# Patient Record
Sex: Female | Born: 1985 | Race: Black or African American | Hispanic: No | Marital: Single | Smoking: Former smoker
Health system: Southern US, Community
[De-identification: ages and names within clinical notes are randomized; demographics above are authoritative.]

## PROBLEM LIST (undated history)

## (undated) DIAGNOSIS — A599 Trichomoniasis, unspecified: Secondary | ICD-10-CM

## (undated) DIAGNOSIS — Z789 Other specified health status: Secondary | ICD-10-CM

## (undated) DIAGNOSIS — G43909 Migraine, unspecified, not intractable, without status migrainosus: Secondary | ICD-10-CM

## (undated) DIAGNOSIS — D649 Anemia, unspecified: Secondary | ICD-10-CM

## (undated) DIAGNOSIS — N201 Calculus of ureter: Secondary | ICD-10-CM

## (undated) DIAGNOSIS — Z8619 Personal history of other infectious and parasitic diseases: Secondary | ICD-10-CM

## (undated) DIAGNOSIS — M1990 Unspecified osteoarthritis, unspecified site: Secondary | ICD-10-CM

## (undated) DIAGNOSIS — R519 Headache, unspecified: Secondary | ICD-10-CM

## (undated) DIAGNOSIS — R51 Headache: Secondary | ICD-10-CM

## (undated) DIAGNOSIS — R351 Nocturia: Secondary | ICD-10-CM

## (undated) HISTORY — PX: DILATION AND CURETTAGE OF UTERUS: SHX78

---
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
## 2005-08-18 ENCOUNTER — Emergency Department (HOSPITAL_COMMUNITY): Admission: EM | Admit: 2005-08-18 | Discharge: 2005-08-18 | Payer: Self-pay | Admitting: Family Medicine

## 2006-02-22 ENCOUNTER — Inpatient Hospital Stay (HOSPITAL_COMMUNITY): Admission: AD | Admit: 2006-02-22 | Discharge: 2006-02-24 | Payer: Self-pay | Admitting: Family Medicine

## 2006-02-22 ENCOUNTER — Ambulatory Visit: Payer: Self-pay | Admitting: Obstetrics and Gynecology

## 2006-07-06 ENCOUNTER — Emergency Department (HOSPITAL_COMMUNITY): Admission: EM | Admit: 2006-07-06 | Discharge: 2006-07-06 | Payer: Self-pay | Admitting: Emergency Medicine

## 2007-07-07 ENCOUNTER — Inpatient Hospital Stay (HOSPITAL_COMMUNITY): Admission: AD | Admit: 2007-07-07 | Discharge: 2007-07-07 | Payer: Self-pay | Admitting: Obstetrics & Gynecology

## 2008-03-05 ENCOUNTER — Inpatient Hospital Stay (HOSPITAL_COMMUNITY): Admission: AD | Admit: 2008-03-05 | Discharge: 2008-03-07 | Payer: Self-pay | Admitting: Obstetrics

## 2009-11-03 ENCOUNTER — Emergency Department (HOSPITAL_COMMUNITY): Admission: EM | Admit: 2009-11-03 | Discharge: 2009-11-03 | Payer: Self-pay | Admitting: Emergency Medicine

## 2010-02-27 ENCOUNTER — Emergency Department (HOSPITAL_COMMUNITY): Admission: EM | Admit: 2010-02-27 | Discharge: 2010-02-27 | Payer: Self-pay | Admitting: Emergency Medicine

## 2010-09-15 ENCOUNTER — Emergency Department (HOSPITAL_COMMUNITY)
Admission: EM | Admit: 2010-09-15 | Discharge: 2010-09-15 | Disposition: A | Discharge status: Home / Self Care | Payer: Medicaid Other | Attending: Emergency Medicine | Admitting: Emergency Medicine

## 2010-09-15 DIAGNOSIS — R1013 Epigastric pain: Secondary | ICD-10-CM | POA: Insufficient documentation

## 2010-09-15 DIAGNOSIS — R110 Nausea: Secondary | ICD-10-CM | POA: Insufficient documentation

## 2010-09-15 DIAGNOSIS — R5383 Other fatigue: Secondary | ICD-10-CM | POA: Insufficient documentation

## 2010-09-15 DIAGNOSIS — R5381 Other malaise: Secondary | ICD-10-CM | POA: Insufficient documentation

## 2010-09-15 DIAGNOSIS — R197 Diarrhea, unspecified: Secondary | ICD-10-CM | POA: Insufficient documentation

## 2010-09-15 LAB — URINE MICROSCOPIC-ADD ON

## 2010-09-15 LAB — POCT PREGNANCY, URINE: Preg Test, Ur: NEGATIVE

## 2010-09-15 LAB — URINALYSIS, ROUTINE W REFLEX MICROSCOPIC
Bilirubin Urine: NEGATIVE
Glucose, UA: NEGATIVE mg/dL
Hgb urine dipstick: NEGATIVE
Ketones, ur: NEGATIVE mg/dL
Nitrite: NEGATIVE
Protein, ur: NEGATIVE mg/dL
Specific Gravity, Urine: 1.023 (ref 1.005–1.030)
Urobilinogen, UA: 0.2 mg/dL (ref 0.0–1.0)
pH: 6 (ref 5.0–8.0)

## 2010-09-26 ENCOUNTER — Inpatient Hospital Stay (HOSPITAL_COMMUNITY)
Admission: AD | Admit: 2010-09-26 | Discharge: 2010-09-26 | Disposition: A | Discharge status: Home / Self Care | Payer: Medicaid Other | Source: Ambulatory Visit | Attending: Obstetrics & Gynecology | Admitting: Obstetrics & Gynecology

## 2010-09-26 DIAGNOSIS — N949 Unspecified condition associated with female genital organs and menstrual cycle: Secondary | ICD-10-CM | POA: Insufficient documentation

## 2010-09-26 DIAGNOSIS — N938 Other specified abnormal uterine and vaginal bleeding: Secondary | ICD-10-CM | POA: Insufficient documentation

## 2010-09-26 LAB — WET PREP, GENITAL
Trich, Wet Prep: NONE SEEN
Yeast Wet Prep HPF POC: NONE SEEN

## 2010-09-26 LAB — CBC
MCV: 82.4 fL (ref 78.0–100.0)
Platelets: 357 10*3/uL (ref 150–400)
RBC: 4.31 MIL/uL (ref 3.87–5.11)
RDW: 14.5 % (ref 11.5–15.5)
WBC: 7.1 10*3/uL (ref 4.0–10.5)

## 2010-09-26 LAB — POCT PREGNANCY, URINE: Preg Test, Ur: NEGATIVE

## 2010-10-24 NOTE — H&P (Signed)
NAMEMarland Kitchen  Ashley Spencer, Ashley Spencer NO.:  [id number]   MEDICAL RECORD NO.:  [id number]          PATIENT TYPE:  INP   LOCATION:  9165                          FACILITY:  [facility]   PHYSICIAN:  Roseanna Rainbow, M.D.DATE OF BIRTH:  05/19/1986   DATE OF ADMISSION:  03/05/2008  DATE OF DISCHARGE:                              HISTORY & PHYSICAL   CHIEF COMPLAINT:  The patient is a 25 year old para 2 with an estimated  date of confinement of March 01, 2008, with an intrauterine  pregnancy of 40 plus weeks complaining of contractions.   HISTORY OF PRESENT ILLNESS:  Please see the above.  She denies rupture  of membranes.   SOCIAL HISTORY:  She is single.  She currently smokes one-half pack per  day.  She denies any alcohol use.   PAST GYN HISTORY:  Normal triad.  There is a history of gonorrhea.   PAST OBSTETRICAL HISTORY:  In 2002 she was delivered of a live born female  7 pounds and 2 ounces, full term, spontaneous vaginal delivery, no  complications.  In 2007, she was delivered of a live born female 6 pounds,  full term, spontaneous vaginal delivery, no complications.   PAST MEDICAL HISTORY:  She denies.   PAST SURGICAL HISTORY:  She denies.   FAMILY HISTORY:  She denies.   MEDICATIONS:  Please see the reconciliation form.   PRENATAL LABS:  Blood type O+.  Antibody screen negative.  HIV  nonreactive.  Hemoglobin 11.9, hematocrit 36.4, platelets 270,000.  Ultrasound on Oct 16, 2007, at 20 weeks 3 days, normal fluid was in the  anterior normal level 2 anatomy.   PHYSICAL EXAM:  Vital signs, stable, afebrile.  Fetal heart tracing  reassuring.  Rare decelerations.  Sterile vaginal exam, cervix is 4-5 cm  dilated per the RN 80% effaced.   ASSESSMENT:  Multipara at term, late latent versus early active labor.  Fetal heart tracing consistent with fetal well being.   PLAN:  Admission, expectant management.      Roseanna Rainbow, M.D.  Electronically Signed     LAJ/MEDQ  D:  03/06/2008  T:  03/06/2008  Job:  [id number]   cc:   Kathreen Cosier, M.D.  Fax: [phone]

## 2010-10-27 NOTE — Op Note (Signed)
[facility]  Patient:    Ashley Spencer, Ashley Spencer Visit Number: [id number] MRN: [id number]          Service Type: OBS Location: 910A 9138 01 Attending Physician:  Venita Sheffield Dictated by:   Kathreen Cosier, M.D. Proc. Date: 01/29/01 Adm. Date:  01/29/2001                             Operative Report  DELIVERY NOTE  HISTORY OF PRESENT ILLNESS:   The patient is a 25 year old primigravida, [facility] January 25, 2001. She was admitted in labor at 1:10 p.m. Cervix was 4 cm, vertex, -2 and AROM performed at 2:50 p.m., the fluid was meconium stained. IUPC and scalp lead applied. She had some variables and amnioinfusion was done. The patient became fully dilated at 6:43 p.m. and she had deep variables during the first 10 minutes of pushing. These had a late component and they were down to 60 to 70, lasting sometimes in excess of a minute. The vertex was a +3 station and it was decided that she should be delivered with the vacuum.  DESCRIPTION OF PROCEDURE:     Local anesthesia was applied to the perineum and a fourth degree cut. The vacuum was applied at 6:55 p.m. It was applied for a total of five contractions prior to delivery. No popoffs occurred for the Tender Touch vacuum. The pressure was relaxed in between contractions. She had a normal vaginal delivery at 10 p.m. and she was DeLeed prior to the delivery of the shoulders. The fluid was clear from the nasopharynx and stomach. The team was in attendance. There was a nuchal cord x 1. The Apgar scores were 2, 6, and 8. The placenta was delivered spontaneously and sent to pathology. There was a fourth degree which was repaired in layers with 2-0 Vicryl. The anus and rectum were checked post-repair and noted to be intact. The patient was delivered from the [facility] position. Dictated by:   Kathreen Cosier, M.D. Attending Physician:  Venita Sheffield DD:  01/29/01 TD:  01/30/01 Job:  [id number]

## 2010-11-15 ENCOUNTER — Emergency Department (INDEPENDENT_AMBULATORY_CARE_PROVIDER_SITE_OTHER): Payer: Medicaid Other

## 2010-11-15 ENCOUNTER — Emergency Department (HOSPITAL_COMMUNITY)
Admission: EM | Admit: 2010-11-15 | Discharge: 2010-11-15 | Disposition: A | Discharge status: Home / Self Care | Payer: Self-pay | Attending: Emergency Medicine | Admitting: Emergency Medicine

## 2010-11-15 ENCOUNTER — Inpatient Hospital Stay (INDEPENDENT_AMBULATORY_CARE_PROVIDER_SITE_OTHER): Admit: 2010-11-15 | Discharge: 2010-11-15 | Disposition: A | Discharge status: Home / Self Care | Payer: Self-pay

## 2010-11-15 DIAGNOSIS — M129 Arthropathy, unspecified: Secondary | ICD-10-CM

## 2010-12-21 ENCOUNTER — Inpatient Hospital Stay (INDEPENDENT_AMBULATORY_CARE_PROVIDER_SITE_OTHER)
Admission: RE | Admit: 2010-12-21 | Discharge: 2010-12-21 | Disposition: A | Discharge status: Home / Self Care | Payer: Self-pay | Source: Ambulatory Visit | Attending: Family Medicine | Admitting: Family Medicine

## 2010-12-21 DIAGNOSIS — B353 Tinea pedis: Secondary | ICD-10-CM

## 2010-12-21 DIAGNOSIS — N912 Amenorrhea, unspecified: Secondary | ICD-10-CM

## 2010-12-21 DIAGNOSIS — M129 Arthropathy, unspecified: Secondary | ICD-10-CM

## 2010-12-21 LAB — POCT PREGNANCY, URINE: Preg Test, Ur: NEGATIVE

## 2011-02-02 ENCOUNTER — Encounter (HOSPITAL_COMMUNITY): Payer: Self-pay

## 2011-02-02 ENCOUNTER — Inpatient Hospital Stay (HOSPITAL_COMMUNITY)
Admission: AD | Admit: 2011-02-02 | Discharge: 2011-02-02 | Disposition: A | Discharge status: Home / Self Care | Payer: Medicaid Other | Source: Ambulatory Visit | Attending: Obstetrics & Gynecology | Admitting: Obstetrics & Gynecology

## 2011-02-02 DIAGNOSIS — N898 Other specified noninflammatory disorders of vagina: Secondary | ICD-10-CM | POA: Insufficient documentation

## 2011-02-02 HISTORY — DX: Other specified health status: Z78.9

## 2011-02-02 LAB — URINE MICROSCOPIC-ADD ON

## 2011-02-02 LAB — URINALYSIS, ROUTINE W REFLEX MICROSCOPIC
Bilirubin Urine: NEGATIVE
Glucose, UA: NEGATIVE mg/dL
Ketones, ur: NEGATIVE mg/dL
Specific Gravity, Urine: 1.01 (ref 1.005–1.030)
pH: 7 (ref 5.0–8.0)

## 2011-02-02 MED ORDER — MULTIVITAMINS PO CAPS: 1.0000 | ORAL_CAPSULE | Freq: Every day | ORAL | Status: DC

## 2011-02-02 MED ORDER — MEDROXYPROGESTERONE ACETATE 10 MG PO TABS: 10.0000 mg | ORAL_TABLET | Freq: Every day | ORAL | Status: DC

## 2011-02-02 NOTE — ED Provider Notes (Signed)
History     Chief Complaint  Patient presents with  . Vaginal Bleeding   Patient is a 25 y.o. female presenting with vaginal bleeding. The history is provided by the patient.  Vaginal Bleeding Pertinent negatives include no chills, fever, nausea or vomiting.    Ashley Spencer, 25 y.o. presents with heavy vaginal bleeding X 5 days with light cramping.  Changing pad every 10-15 minutes.  LMP 820/12.  Had Depo March did not get injection as planned in July.  Is asking for medication to stop the bleeding.  Sexually active X 1 partner.  Was using Depo for birth control.  Treated in April for chlamydia.  Denies UTI and vaginal sxs.  Pregnancy test here is negative.  Past Medical History  Diagnosis Date  . No pertinent past medical history     Past Surgical History  Procedure Date  . Dilation and curettage of uterus     No family history on file.  History  Substance Use Topics  . Smoking status: Current Everyday Smoker -- 0.2 packs/day  . Smokeless tobacco: Never Used  . Alcohol Use: No    Allergies: No Known Allergies  No prescriptions prior to admission    Review of Systems  Constitutional: Negative for fever and chills.  Gastrointestinal: Negative for nausea and vomiting.  Genitourinary: Positive for vaginal bleeding. Negative for dysuria, urgency, frequency and hematuria. Flank pain: vaginal bleeding.   Physical Exam   Blood pressure 121/74, pulse 67, temperature 99 F (37.2 C), temperature source Oral, resp. rate 16, height 5' (1.524 m), weight 108 lb 6 oz (49.159 kg), last menstrual period 01/29/2011.  Physical Exam  MAU Course  Procedures   GC/CHl cultures to lab.    Wet prep top came off during transport to lab--will cancel order because patient had to leave to get her children and cannot repeat test.  MD Patient reported to me at 4:30 that her ride was leaving and she had to get her children from day care at 5 or she is fined.   She could not stay for CBC  draw.   Explained results for cult would be back on Monday.  I would call her medication into [facility] [address] and call her if I needed.  She agreed.  Her # is [phone].     Consulted with Dr. Marice Potter.Reported patient's complaint and that she needed to leave.  Agreed with Provera 10mg  po qd X 10 days.  Will call in Multivitamins.  Encourage to go to [facility] for Depo Provera.   Assessment and Plan  Vaginal bleeding  KEY,EVE M 02/02/2011, 4:48 PM   Matt Holmes, NP 02/06/11 1913

## 2011-02-02 NOTE — Progress Notes (Signed)
Pt states last depo injection in April, supposed to return 12/12/2010 for second dose, missed appt, now having bleeding qweek, will stop for 2-3 days, then return. Denies vag d/c changes.

## 2011-03-01 LAB — URINALYSIS, ROUTINE W REFLEX MICROSCOPIC
Glucose, UA: NEGATIVE
Ketones, ur: NEGATIVE
Protein, ur: NEGATIVE
Urobilinogen, UA: 0.2

## 2011-03-01 LAB — POCT PREGNANCY, URINE
Operator id: 220991
Preg Test, Ur: POSITIVE

## 2011-03-12 LAB — CBC
HCT: 33.1 — ABNORMAL LOW
HCT: 36.4
Hemoglobin: 10.9 — ABNORMAL LOW
MCV: 85.7
Platelets: 270
RDW: 14.7
WBC: 20.7 — ABNORMAL HIGH
WBC: 9.2

## 2011-03-12 LAB — DIFFERENTIAL
Basophils Absolute: 0
Eosinophils Relative: 1
Lymphs Abs: 2
Monocytes Absolute: 0.8
Monocytes Relative: 9
Neutrophils Relative %: 68

## 2011-03-12 LAB — HEPATITIS B SURFACE ANTIGEN: Hepatitis B Surface Ag: NEGATIVE

## 2011-03-12 LAB — RUBELLA SCREEN: Rubella: 18 — ABNORMAL HIGH

## 2011-03-12 LAB — RAPID HIV SCREEN (WH-MAU): Rapid HIV Screen: NONREACTIVE

## 2011-03-12 LAB — TYPE AND SCREEN

## 2011-05-13 ENCOUNTER — Emergency Department (HOSPITAL_COMMUNITY)
Admission: EM | Admit: 2011-05-13 | Discharge: 2011-05-13 | Discharge status: Against Medical Advice | Payer: Medicaid Other | Attending: Emergency Medicine | Admitting: Emergency Medicine

## 2011-05-13 DIAGNOSIS — R070 Pain in throat: Secondary | ICD-10-CM | POA: Insufficient documentation

## 2011-05-13 DIAGNOSIS — R109 Unspecified abdominal pain: Secondary | ICD-10-CM | POA: Insufficient documentation

## 2011-05-14 MED ORDER — ACETAMINOPHEN 325 MG PO TABS
ORAL_TABLET | ORAL | Status: AC
  Filled 2011-05-14: qty 2

## 2011-06-15 ENCOUNTER — Inpatient Hospital Stay (HOSPITAL_COMMUNITY)
Admission: AD | Admit: 2011-06-15 | Discharge: 2011-06-16 | Disposition: A | Discharge status: Home / Self Care | Payer: Medicaid Other | Source: Ambulatory Visit | Attending: Obstetrics | Admitting: Obstetrics

## 2011-06-15 DIAGNOSIS — R1032 Left lower quadrant pain: Secondary | ICD-10-CM | POA: Insufficient documentation

## 2011-06-15 DIAGNOSIS — A5901 Trichomonal vulvovaginitis: Secondary | ICD-10-CM

## 2011-06-15 LAB — URINALYSIS, ROUTINE W REFLEX MICROSCOPIC
Bilirubin Urine: NEGATIVE
Ketones, ur: 15 mg/dL — AB
Leukocytes, UA: NEGATIVE
Nitrite: NEGATIVE
Protein, ur: NEGATIVE mg/dL
Urobilinogen, UA: 0.2 mg/dL (ref 0.0–1.0)
pH: 6 (ref 5.0–8.0)

## 2011-06-15 NOTE — Progress Notes (Signed)
Pt states, " I've had pain in my low left abdomen since this morning."

## 2011-06-16 ENCOUNTER — Encounter (HOSPITAL_COMMUNITY): Payer: Self-pay | Admitting: Obstetrics and Gynecology

## 2011-06-16 LAB — WET PREP, GENITAL

## 2011-06-16 MED ORDER — METRONIDAZOLE 500 MG PO TABS: 500.0000 mg | ORAL_TABLET | Freq: Two times a day (BID) | ORAL | Status: AC

## 2011-06-16 MED ORDER — IBUPROFEN 400 MG PO TABS
400.0000 mg | ORAL_TABLET | Freq: Once | ORAL | Status: AC
  Administered 2011-06-16: 400 mg via ORAL
  Filled 2011-06-16: qty 1

## 2011-06-16 NOTE — Progress Notes (Signed)
"  My stomach started hurting this morning and it has gone on all day.  It is sharp.  No bleeding, abnormal d/c, or N/V."

## 2011-06-16 NOTE — ED Provider Notes (Signed)
History     Chief Complaint  Patient presents with  . Abdominal Pain   HPI Ashley Spencer 26 y.o. LMP 05-27-11.  Comes with LLQ pain.  No nausea.  No vomiting.  No vaginal discharge.   OB History    Grav Para Term Preterm Abortions TAB SAB Ect Mult Living   5 3 3  2 2    3       Past Medical History  Diagnosis Date  . No pertinent past medical history     Past Surgical History  Procedure Date  . Dilation and curettage of uterus     History reviewed. No pertinent family history.  History  Substance Use Topics  . Smoking status: Current Everyday Smoker -- 0.7 packs/day for 3 years    Types: Cigarettes  . Smokeless tobacco: Never Used  . Alcohol Use: No    Allergies: No Known Allergies  Prescriptions prior to admission  Medication Sig Dispense Refill  . Multiple Vitamin (MULITIVITAMIN WITH MINERALS) TABS Take 1 tablet by mouth daily.        . naproxen sodium (ANAPROX) 220 MG tablet Take 220 mg by mouth 2 (two) times daily as needed. For pain         ROS Physical Exam   Blood pressure 115/71, pulse 86, temperature 99 F (37.2 C), temperature source Oral, resp. rate 20, height 5' 0.75" (1.543 m), weight 105 lb 4 oz (47.741 kg), last menstrual period 05/27/2011.  Physical Exam  Nursing note and vitals reviewed. Constitutional: She is oriented to person, place, and time. She appears well-developed and well-nourished.  HENT:  Head: Normocephalic.  Eyes: EOM are normal.  Neck: Neck supple.  GI: Soft. There is tenderness. There is no rebound and no guarding.       Mild LLQ tenderness  Genitourinary:       Speculum exam: Vagina - Small amount of dry, grainy discharge, no odor Cervix - No contact bleeding Bimanual exam: Cervix closed Uterus mildly tender, normal size Adnexa mildly tender on left side, no masses bilaterally GC/Chlam, wet prep done Chaperone present for exam.  Musculoskeletal: Normal range of motion.  Neurological: She is alert and oriented  to person, place, and time.  Skin: Skin is warm and dry.  Psychiatric: She has a normal mood and affect.    MAU Course  Procedures  MDM Results for orders placed during the hospital encounter of 06/15/11 (from the past 24 hour(s))  URINALYSIS, ROUTINE W REFLEX MICROSCOPIC     Status: Abnormal   Collection Time   06/15/11 10:55 PM      Component Value Range   Color, Urine YELLOW  YELLOW    APPearance CLEAR  CLEAR    Specific Gravity, Urine 1.025  1.005 - 1.030    pH 6.0  5.0 - 8.0    Glucose, UA NEGATIVE  NEGATIVE (mg/dL)   Hgb urine dipstick NEGATIVE  NEGATIVE    Bilirubin Urine NEGATIVE  NEGATIVE    Ketones, ur 15 (*) NEGATIVE (mg/dL)   Protein, ur NEGATIVE  NEGATIVE (mg/dL)   Urobilinogen, UA 0.2  0.0 - 1.0 (mg/dL)   Nitrite NEGATIVE  NEGATIVE    Leukocytes, UA NEGATIVE  NEGATIVE   POCT PREGNANCY, URINE     Status: Normal   Collection Time   06/15/11 11:04 PM      Component Value Range   Preg Test, Ur NEGATIVE    WET PREP, GENITAL     Status: Abnormal   Collection Time  06/16/11  1:20 AM      Component Value Range   Yeast, Wet Prep NONE SEEN  NONE SEEN    Trich, Wet Prep FEW (*) NONE SEEN    Clue Cells, Wet Prep MANY (*) NONE SEEN    WBC, Wet Prep HPF POC MODERATE (*) NONE SEEN      Assessment and Plan  Trichomonas  Plan Rx metronidazole 500 mg PO bid x 7 days  Refer partner for treatment - can receive treatment at the STD clinic at the [facility] dept No sex for 10 days No alcohol while taking your medication. Condoms always for STD/HIV protection.  Ashley Spencer 06/16/2011, 1:20 AM   Nolene Bernheim, NP 06/16/11 0155

## 2012-05-03 ENCOUNTER — Emergency Department (INDEPENDENT_AMBULATORY_CARE_PROVIDER_SITE_OTHER)
Admission: EM | Admit: 2012-05-03 | Discharge: 2012-05-03 | Disposition: A | Discharge status: Home / Self Care | Payer: Self-pay | Source: Home / Self Care

## 2012-05-03 ENCOUNTER — Encounter (HOSPITAL_COMMUNITY): Payer: Self-pay | Admitting: Emergency Medicine

## 2012-05-03 DIAGNOSIS — K122 Cellulitis and abscess of mouth: Secondary | ICD-10-CM

## 2012-05-03 DIAGNOSIS — K0262 Dental caries on smooth surface penetrating into dentin: Secondary | ICD-10-CM

## 2012-05-03 DIAGNOSIS — K089 Disorder of teeth and supporting structures, unspecified: Secondary | ICD-10-CM

## 2012-05-03 DIAGNOSIS — B89 Unspecified parasitic disease: Secondary | ICD-10-CM

## 2012-05-03 DIAGNOSIS — K1370 Unspecified lesions of oral mucosa: Secondary | ICD-10-CM

## 2012-05-03 DIAGNOSIS — K0889 Other specified disorders of teeth and supporting structures: Secondary | ICD-10-CM

## 2012-05-03 DIAGNOSIS — L089 Local infection of the skin and subcutaneous tissue, unspecified: Secondary | ICD-10-CM

## 2012-05-03 MED ORDER — AMOXICILLIN-POT CLAVULANATE 875-125 MG PO TABS: 1.0000 | ORAL_TABLET | Freq: Two times a day (BID) | ORAL | Status: DC

## 2012-05-03 MED ORDER — KETOROLAC TROMETHAMINE 60 MG/2ML IM SOLN
INTRAMUSCULAR | Status: AC
  Filled 2012-05-03: qty 2

## 2012-05-03 MED ORDER — KETOROLAC TROMETHAMINE 60 MG/2ML IM SOLN
60.0000 mg | Freq: Once | INTRAMUSCULAR | Status: AC
  Administered 2012-05-03: 60 mg via INTRAMUSCULAR

## 2012-05-03 MED ORDER — HYDROCODONE-ACETAMINOPHEN 7.5-325 MG PO TABS: 1.0000 | ORAL_TABLET | Freq: Four times a day (QID) | ORAL | Status: DC | PRN

## 2012-05-03 NOTE — ED Notes (Signed)
Pt c/o dental pain (upper right) x2 days... Noticed swelling this am on right side of face... Denies: fevers, vomiting, nauseas, diarrhea... Pt is alert w/no signs of distress.

## 2012-05-03 NOTE — ED Provider Notes (Signed)
History     CSN: [id number]  Arrival date & time 05/03/12  1224   None     Chief Complaint  Patient presents with  . Dental Pain    (Consider location/radiation/quality/duration/timing/severity/associated sxs/prior treatment) HPI Comments: A female presents with a toothache for 2 days. This morning she started swelling or several days. She states on October 31 she bit down a piece of candy and a portion of her filling broke out. She did not have pain from that until just a few days ago.  Patient is a 26 y.o. female presenting with tooth pain.  Dental PainPrimary symptoms do not include fever.    Past Medical History  Diagnosis Date  . No pertinent past medical history     Past Surgical History  Procedure Date  . Dilation and curettage of uterus     No family history on file.  History  Substance Use Topics  . Smoking status: Current Every Day Smoker -- 1.0 packs/day for 3 years    Types: Cigarettes  . Smokeless tobacco: Never Used  . Alcohol Use: No    OB History    Grav Para Term Preterm Abortions TAB SAB Ect Mult Living   5 3 3  2 2    3       Review of Systems  Constitutional: Negative for fever.  All other systems reviewed and are negative.    Allergies  Review of patient's allergies indicates no known allergies.  Home Medications   Current Outpatient Rx  Name  Route  Sig  Dispense  Refill  . NAPROXEN SODIUM 220 MG PO TABS   Oral   Take 220 mg by mouth 2 (two) times daily as needed. For pain          . AMOXICILLIN-POT CLAVULANATE 875-125 MG PO TABS   Oral   Take 1 tablet by mouth every 12 (twelve) hours.   14 tablet   0   . HYDROCODONE-ACETAMINOPHEN 7.5-325 MG PO TABS   Oral   Take 1 tablet by mouth every 6 (six) hours as needed for pain.   20 tablet   0   . ADULT MULTIVITAMIN W/MINERALS CH   Oral   Take 1 tablet by mouth daily.             BP 135/94  Pulse 80  Temp 98.2 F (36.8 C) (Oral)  Resp 18  SpO2 100%  LMP  04/19/2012  Physical Exam  Constitutional: She is oriented to person, place, and time. She appears well-developed and well-nourished. No distress.  HENT:  Mouth/Throat: Oropharynx is clear and moist.       Swelling of the gingiva along the right upper second and third molars. There is dental tenderness of the right upper premolar where there is a cavernous tooth. There is also right facial swelling and pain that extends to the right paranasal area and to the chin. No erythema  Eyes: Conjunctivae normal and EOM are normal.  Neck: Normal range of motion. Neck supple.  Pulmonary/Chest: Effort normal.  Neurological: She is alert and oriented to person, place, and time. She exhibits normal muscle tone.  Skin: Skin is warm and dry.  Psychiatric: She has a normal mood and affect.    ED Course  Procedures (including critical care time)  Labs Reviewed - No data to display No results found.   1. Dental caries extending into dentine   2. Odontalgia   3. Infection, face   4. Infection of mouth  MDM  Augmentin 875 twice a day for 7 days Toradol 60 mg IM now Ibuprofen 600 mg Every 6 hours when necessary pain Norco 7.5 mg every 4 hours when necessary pain #20 Must find a dentist as soon as possible.        Hayden Rasmussen, NP 05/03/12 1406

## 2012-05-03 NOTE — ED Provider Notes (Signed)
Medical screening examination/treatment/procedure(s) were performed by resident physician or non-physician practitioner and as supervising physician I was immediately available for consultation/collaboration.   Milas Schappell DOUGLAS MD.    Ashtynn Berke D Evalee Gerard, MD 05/03/12 1832 

## 2012-11-03 ENCOUNTER — Inpatient Hospital Stay (HOSPITAL_COMMUNITY)
Admission: AD | Admit: 2012-11-03 | Discharge: 2012-11-03 | Disposition: A | Discharge status: Home / Self Care | Payer: Medicaid Other | Source: Ambulatory Visit | Attending: Obstetrics | Admitting: Obstetrics

## 2012-11-03 ENCOUNTER — Encounter (HOSPITAL_COMMUNITY): Payer: Self-pay | Admitting: *Deleted

## 2012-11-03 DIAGNOSIS — B9689 Other specified bacterial agents as the cause of diseases classified elsewhere: Secondary | ICD-10-CM

## 2012-11-03 DIAGNOSIS — N760 Acute vaginitis: Secondary | ICD-10-CM | POA: Insufficient documentation

## 2012-11-03 DIAGNOSIS — N949 Unspecified condition associated with female genital organs and menstrual cycle: Secondary | ICD-10-CM | POA: Insufficient documentation

## 2012-11-03 DIAGNOSIS — A499 Bacterial infection, unspecified: Secondary | ICD-10-CM | POA: Insufficient documentation

## 2012-11-03 HISTORY — DX: Trichomoniasis, unspecified: A59.9

## 2012-11-03 LAB — WET PREP, GENITAL: Trich, Wet Prep: NONE SEEN

## 2012-11-03 LAB — URINALYSIS, ROUTINE W REFLEX MICROSCOPIC
Bilirubin Urine: NEGATIVE
Leukocytes, UA: NEGATIVE
Nitrite: NEGATIVE
Specific Gravity, Urine: 1.02 (ref 1.005–1.030)
Urobilinogen, UA: 0.2 mg/dL (ref 0.0–1.0)
pH: 6.5 (ref 5.0–8.0)

## 2012-11-03 MED ORDER — METRONIDAZOLE 500 MG PO TABS: 500.0000 mg | ORAL_TABLET | Freq: Two times a day (BID) | ORAL | Status: DC

## 2012-11-03 NOTE — MAU Note (Signed)
Patient states she has been having a slight white vaginal discharge with an odor for about one week. Denies bleeding or pain.

## 2012-11-03 NOTE — MAU Provider Note (Signed)
History     CSN: [id number]  Arrival date and time: 11/03/12 1636   First Provider Initiated Contact with Patient 11/03/12 1849      Chief Complaint  Patient presents with  . Vaginal Discharge   HPI  Ashley Spencer is a 27 y.o. [id number] who presents today with vaginal discharge with odor. She states the the vaginal discharge started about a week ago, and she has noticed the odor for the last 2 days. She denies any itching.   Past Medical History  Diagnosis Date  . No pertinent past medical history     Past Surgical History  Procedure Laterality Date  . Dilation and curettage of uterus      No family history on file.  History  Substance Use Topics  . Smoking status: Current Every Day Smoker -- 1.00 packs/day for 3 years    Types: Cigarettes  . Smokeless tobacco: Never Used  . Alcohol Use: No    Allergies: No Known Allergies  Prescriptions prior to admission  Medication Sig Dispense Refill  . amoxicillin-clavulanate (AUGMENTIN) 875-125 MG per tablet Take 1 tablet by mouth every 12 (twelve) hours.  14 tablet  0  . HYDROcodone-acetaminophen (NORCO) 7.5-325 MG per tablet Take 1 tablet by mouth every 6 (six) hours as needed for pain.  20 tablet  0  . Multiple Vitamin (MULITIVITAMIN WITH MINERALS) TABS Take 1 tablet by mouth daily.        . naproxen sodium (ANAPROX) 220 MG tablet Take 220 mg by mouth 2 (two) times daily as needed. For pain         ROS Physical Exam   Blood pressure 103/77, pulse 79, temperature 98.3 F (36.8 C), temperature source Oral, resp. rate 16, height 5' (1.524 m), weight 47.537 kg (104 lb 12.8 oz), last menstrual period 10/19/2012, SpO2 100.00%.  Physical Exam  Nursing note and vitals reviewed. Constitutional: She is oriented to person, place, and time. She appears well-developed and well-nourished. No distress.  Cardiovascular: Normal rate.   Respiratory: Effort normal.  GI: Soft.  Genitourinary:  External: no lesion Vagina: scant  amount of white discahrge Cervix: pink, parous, smooth, no CMT Uterus: AV, NSSC Adnexa: NT  Neurological: She is alert and oriented to person, place, and time.  Skin: Skin is warm and dry.  Psychiatric: She has a normal mood and affect.    MAU Course  Procedures  Results for orders placed during the hospital encounter of 11/03/12 (from the past 24 hour(s))  URINALYSIS, ROUTINE W REFLEX MICROSCOPIC     Status: None   Collection Time    11/03/12  5:30 PM      Result Value Range   Color, Urine YELLOW  YELLOW   APPearance CLEAR  CLEAR   Specific Gravity, Urine 1.020  1.005 - 1.030   pH 6.5  5.0 - 8.0   Glucose, UA NEGATIVE  NEGATIVE mg/dL   Hgb urine dipstick NEGATIVE  NEGATIVE   Bilirubin Urine NEGATIVE  NEGATIVE   Ketones, ur NEGATIVE  NEGATIVE mg/dL   Protein, ur NEGATIVE  NEGATIVE mg/dL   Urobilinogen, UA 0.2  0.0 - 1.0 mg/dL   Nitrite NEGATIVE  NEGATIVE   Leukocytes, UA NEGATIVE  NEGATIVE  POCT PREGNANCY, URINE     Status: None   Collection Time    11/03/12  5:39 PM      Result Value Range   Preg Test, Ur NEGATIVE  NEGATIVE  WET PREP, GENITAL     Status: Abnormal  Collection Time    11/03/12  6:55 PM      Result Value Range   Yeast Wet Prep HPF POC NONE SEEN  NONE SEEN   Trich, Wet Prep NONE SEEN  NONE SEEN   Clue Cells Wet Prep HPF POC FEW (*) NONE SEEN   WBC, Wet Prep HPF POC FEW (*) NONE SEEN     Assessment and Plan   1. BV (bacterial vaginosis)    RX: Flagyl 500mg  BID X 7 FU with Dr. Gaynell Face as needed  Tawnya Crook 11/03/2012, 6:54 PM

## 2012-11-04 LAB — GC/CHLAMYDIA PROBE AMP: GC Probe RNA: NEGATIVE

## 2013-05-27 ENCOUNTER — Emergency Department (HOSPITAL_COMMUNITY)
Admission: EM | Admit: 2013-05-27 | Discharge: 2013-05-27 | Discharge status: Against Medical Advice | Payer: Medicaid Other | Attending: Emergency Medicine | Admitting: Emergency Medicine

## 2013-05-27 ENCOUNTER — Emergency Department (HOSPITAL_COMMUNITY): Admit: 2013-05-27 | Payer: Medicaid Other | Source: Home / Self Care

## 2013-05-27 ENCOUNTER — Encounter (HOSPITAL_COMMUNITY): Payer: Self-pay | Admitting: Emergency Medicine

## 2013-05-27 DIAGNOSIS — R110 Nausea: Secondary | ICD-10-CM | POA: Insufficient documentation

## 2013-05-27 DIAGNOSIS — Z2089 Contact with and (suspected) exposure to other communicable diseases: Secondary | ICD-10-CM | POA: Insufficient documentation

## 2013-05-27 DIAGNOSIS — J029 Acute pharyngitis, unspecified: Secondary | ICD-10-CM | POA: Insufficient documentation

## 2013-05-27 DIAGNOSIS — F17200 Nicotine dependence, unspecified, uncomplicated: Secondary | ICD-10-CM | POA: Insufficient documentation

## 2013-05-27 MED ORDER — ONDANSETRON 4 MG PO TBDP
8.0000 mg | ORAL_TABLET | Freq: Once | ORAL | Status: AC
  Administered 2013-05-27: 8 mg via ORAL
  Filled 2013-05-27: qty 2

## 2013-05-27 NOTE — ED Notes (Signed)
Pt c/o sore throat and nausea, nausea controlled with medicaiton, just sore throat concerned now

## 2013-05-27 NOTE — ED Notes (Signed)
Pt states that she has been nauseated today, but has not vomited and has a sore throat today

## 2013-05-30 ENCOUNTER — Emergency Department (HOSPITAL_COMMUNITY): Payer: Medicaid Other

## 2013-05-30 ENCOUNTER — Encounter (HOSPITAL_COMMUNITY): Payer: Self-pay | Admitting: Emergency Medicine

## 2013-05-30 ENCOUNTER — Emergency Department (HOSPITAL_COMMUNITY)
Admission: EM | Admit: 2013-05-30 | Discharge: 2013-05-30 | Disposition: A | Discharge status: Home / Self Care | Payer: Medicaid Other | Attending: Emergency Medicine | Admitting: Emergency Medicine

## 2013-05-30 DIAGNOSIS — L03011 Cellulitis of right finger: Secondary | ICD-10-CM

## 2013-05-30 DIAGNOSIS — W230XXA Caught, crushed, jammed, or pinched between moving objects, initial encounter: Secondary | ICD-10-CM | POA: Insufficient documentation

## 2013-05-30 DIAGNOSIS — Y939 Activity, unspecified: Secondary | ICD-10-CM | POA: Insufficient documentation

## 2013-05-30 DIAGNOSIS — S6991XA Unspecified injury of right wrist, hand and finger(s), initial encounter: Secondary | ICD-10-CM

## 2013-05-30 DIAGNOSIS — S6000XA Contusion of unspecified finger without damage to nail, initial encounter: Secondary | ICD-10-CM | POA: Insufficient documentation

## 2013-05-30 DIAGNOSIS — F17200 Nicotine dependence, unspecified, uncomplicated: Secondary | ICD-10-CM | POA: Insufficient documentation

## 2013-05-30 DIAGNOSIS — Z8619 Personal history of other infectious and parasitic diseases: Secondary | ICD-10-CM | POA: Insufficient documentation

## 2013-05-30 DIAGNOSIS — Y929 Unspecified place or not applicable: Secondary | ICD-10-CM | POA: Insufficient documentation

## 2013-05-30 DIAGNOSIS — IMO0002 Reserved for concepts with insufficient information to code with codable children: Secondary | ICD-10-CM | POA: Insufficient documentation

## 2013-05-30 MED ORDER — CEPHALEXIN 500 MG PO CAPS: 500.0000 mg | ORAL_CAPSULE | Freq: Four times a day (QID) | ORAL | Status: DC

## 2013-05-30 MED ORDER — HYDROCODONE-ACETAMINOPHEN 5-325 MG PO TABS: 2.0000 | ORAL_TABLET | ORAL | Status: DC | PRN

## 2013-05-30 MED ORDER — HYDROCODONE-ACETAMINOPHEN 5-325 MG PO TABS
1.0000 | ORAL_TABLET | Freq: Once | ORAL | Status: AC
  Administered 2013-05-30: 1 via ORAL
  Filled 2013-05-30: qty 1

## 2013-05-30 NOTE — ED Provider Notes (Signed)
CSN: [id number]     Arrival date & time 05/30/13  1220 History  This chart was scribed for non-physician practitioner, Lesleigh Noe, working with Suzi Roots, MD by Smiley Houseman, ED Scribe. This patient was seen in room TR07C/TR07C and the patient's care was started at 1:07pm.   Chief Complaint  Patient presents with  . Hand Injury   The history is provided by the patient. No language interpreter was used.   HPI Comments: Ashley Spencer is a 27 y.o. female with no PMH who presents to the Emergency Department complaining of constant moderate pain in her right middle and ring fingers onset 3 days ago after slamming her hand in car door.  Patient has had artifical nails since October with new filling two weeks ago.  She reports pain with palpation and hand movement, however, has no limitations.  Patient states the finger has been draining pus and blood from under her acrylic nails.  She denies significant drainage.  She states she has has constant numbness in the distal portion of her right middle finger.  Patient has no h/o injury or fractures to the right hand.  Patient denies any vomiting, abdominal pain, fever, appetite change and headache.  Patient has taken Aleve for pain, but without relief.  Patient denies any allergies or other medical problems.    Past Medical History  Diagnosis Date  . No pertinent past medical history   . Trichomonas    Past Surgical History  Procedure Laterality Date  . Dilation and curettage of uterus     History reviewed. No pertinent family history. History  Substance Use Topics  . Smoking status: Current Every Day Smoker -- 1.00 packs/day for 3 years    Types: Cigarettes  . Smokeless tobacco: Never Used  . Alcohol Use: No   OB History   Grav Para Term Preterm Abortions TAB SAB Ect Mult Living   5 3 3  2 2    3      Review of Systems  Constitutional: Negative for fever, chills, activity change, appetite change and fatigue.  Respiratory:  Negative for cough.   Gastrointestinal: Negative for nausea, vomiting and abdominal pain.  Musculoskeletal: Negative for back pain and myalgias.  Skin: Negative for color change and wound.  Neurological: Positive for numbness. Negative for weakness and headaches.  All other systems reviewed and are negative.    Allergies  Review of patient's allergies indicates no known allergies.  Home Medications   Current Outpatient Rx  Name  Route  Sig  Dispense  Refill  . acetaminophen (TYLENOL) 500 MG tablet   Oral   Take 1,000 mg by mouth every 6 (six) hours as needed for pain.         . naproxen sodium (ANAPROX) 220 MG tablet   Oral   Take 440 mg by mouth 2 (two) times daily as needed. For pain          Triage Vitals: BP 117/77  Pulse 76  Temp(Src) 98 F (36.7 C) (Oral)  Resp 18  Wt 106 lb 3.2 oz (48.172 kg)  SpO2 100%  LMP 05/16/2013  Filed Vitals:   05/30/13 1230  BP: 117/77  Pulse: 76  Temp: 98 F (36.7 C)  TempSrc: Oral  Resp: 18  Weight: 106 lb 3.2 oz (48.172 kg)  SpO2: 100%    Physical Exam  Nursing note and vitals reviewed. Constitutional: She is oriented to person, place, and time. She appears well-developed and well-nourished. No distress.  HENT:  Head: Normocephalic and atraumatic.  Right Ear: External ear normal.  Left Ear: External ear normal.  Nose: Nose normal.  Mouth/Throat: Oropharynx is clear and moist.  Eyes: Conjunctivae and EOM are normal. Right eye exhibits no discharge. Left eye exhibits no discharge.  Neck: Neck supple. No tracheal deviation present.  Cardiovascular: Normal rate, regular rhythm and normal heart sounds.  Exam reveals no gallop and no friction rub.   No murmur heard. Radial pulses present bilaterally.  Capillary refill <2 seconds on digits of right hand.    Pulmonary/Chest: Effort normal and breath sounds normal. No respiratory distress. She has no wheezes. She has no rales. She exhibits no tenderness.  Abdominal: Soft.   Musculoskeletal: Normal range of motion. She exhibits edema and tenderness.       Hands: Diffuse tenderness to palpation to the distal portion of the right 3rd and 4th phalanges.  No felon or obvious paronychia surrounding acrylic nails to the 3rd and 4th digits.  Minimal purulent and bloody drainage expelled from nailbed of 3rd and 4th digits.  Minimal edema to the distal phalanges of the 3rd and 4th digits which does not extend past the DIP.  No erythema.  Patient able to flex and extend PIP and DIP joints of right hand without difficulty or limitations.  No open wounds or lacerations.    Neurological: She is alert and oriented to person, place, and time.  Sensation intact in the digits of right hand.  Skin: Skin is warm and dry. She is not diaphoretic. No erythema.  Psychiatric: She has a normal mood and affect. Her behavior is normal.    ED Course  Procedures (including critical care time) DIAGNOSTIC STUDIES: Oxygen Saturation is 100% on room air, normal by my interpretation.    COORDINATION OF CARE: 1:10 PM-Waiting for x-ray results.  Will order Vicodin.  Patient informed of current plan of treatment and evaluation and agrees with plan.    Labs Review Labs Reviewed - No data to display Imaging Review Dg Hand Complete Right  05/30/2013   CLINICAL DATA:  Close right hand in car door.  EXAM: RIGHT HAND - COMPLETE 3+ VIEW  COMPARISON:  None.  FINDINGS: There is no evidence of fracture or dislocation. There is no evidence of arthropathy or other focal bone abnormality. Soft tissues are unremarkable.  IMPRESSION: Negative.   Electronically Signed   By: Charlett Nose M.D.   On: 05/30/2013 13:30    MDM   Ashley Spencer is a 27 y.o. female with no PMH who presents to the Emergency Department complaining of constant moderate pain in her right middle and ring fingers onset 3 days ago after slamming her hand in car door.     Patient evaluated in the ED for a finger injury occuring 3 days  prior to arrival.  X-rays negative for fx or malalignment.  No open lacerations.  Patient likely developing a nailbed infection under her acrylic nails, but this is unable to be fully visualized.   No felon or obvious paronychia at this time.  There was minimal purulent material expelled from under acrylic nails, which were unable to be removed.  Patient neurovascularly intact.  Patient started on Keflex and instructed to trim down nails or have them removed as an OP.  Patient instructed to return to the ED in two days for wound re-check.  Return precautions, discharge instructions, and follow-up was discussed with the patient before discharge.    Discharge Medication List as of  05/30/2013  2:03 PM    START taking these medications   Details  cephALEXin (KEFLEX) 500 MG capsule Take 1 capsule (500 mg total) by mouth 4 (four) times daily., Starting 05/30/2013, Until Discontinued, Print    HYDROcodone-acetaminophen (NORCO/VICODIN) 5-325 MG per tablet Take 2 tablets by mouth every 4 (four) hours as needed., Starting 05/30/2013, Until Discontinued, Print        Final impressions: 1. Finger injury, right, initial encounter   2. Infected nailbed of finger, right      Luiz Iron PA-C   This patient was discussed with Dr. Denton Lank who evaluated the patient        Jillyn Ledger, PA-C 05/31/13 2125

## 2013-05-30 NOTE — ED Notes (Signed)
Pt report slamming right hand in a door 3 days ago and still having pain.

## 2013-06-01 ENCOUNTER — Emergency Department (HOSPITAL_COMMUNITY)
Admission: EM | Admit: 2013-06-01 | Discharge: 2013-06-01 | Disposition: A | Discharge status: Home / Self Care | Payer: Medicaid Other | Attending: Emergency Medicine | Admitting: Emergency Medicine

## 2013-06-01 ENCOUNTER — Encounter (HOSPITAL_COMMUNITY): Payer: Self-pay | Admitting: Emergency Medicine

## 2013-06-01 DIAGNOSIS — Z8619 Personal history of other infectious and parasitic diseases: Secondary | ICD-10-CM | POA: Insufficient documentation

## 2013-06-01 DIAGNOSIS — Z792 Long term (current) use of antibiotics: Secondary | ICD-10-CM | POA: Insufficient documentation

## 2013-06-01 DIAGNOSIS — S6991XA Unspecified injury of right wrist, hand and finger(s), initial encounter: Secondary | ICD-10-CM

## 2013-06-01 DIAGNOSIS — G8911 Acute pain due to trauma: Secondary | ICD-10-CM | POA: Insufficient documentation

## 2013-06-01 DIAGNOSIS — S61309A Unspecified open wound of unspecified finger with damage to nail, initial encounter: Secondary | ICD-10-CM

## 2013-06-01 DIAGNOSIS — F17200 Nicotine dependence, unspecified, uncomplicated: Secondary | ICD-10-CM | POA: Insufficient documentation

## 2013-06-01 DIAGNOSIS — M79609 Pain in unspecified limb: Secondary | ICD-10-CM | POA: Insufficient documentation

## 2013-06-01 MED ORDER — OXYCODONE-ACETAMINOPHEN 5-325 MG PO TABS
1.0000 | ORAL_TABLET | Freq: Once | ORAL | Status: AC
  Administered 2013-06-01: 1 via ORAL
  Filled 2013-06-01: qty 1

## 2013-06-01 NOTE — ED Notes (Addendum)
Pt was seen here 12/20 for painful right middle finger. Was given Rx for abx and Vicodin. Started abx yesterday but states she is unable afford her pain meds. States she is going to ask her Grandmother for the money today. Right middle finger appears swollen around cuticle. Orange -colored drainage noted under nail.

## 2013-06-01 NOTE — ED Provider Notes (Signed)
CSN: [id number]     Arrival date & time 06/01/13  1233 History   This chart was scribed for non-physician practitioner Jaynie Crumble, PA-C, working with Gavin Pound. Oletta Lamas, MD, by Yevette Edwards, ED Scribe. This patient was seen in room TR09C/TR09C and the patient's care was started at 2:33 PM.  First MD Initiated Contact with Patient 06/01/13 1403     Chief Complaint  Patient presents with  . Hand Pain   The history is provided by the patient. No language interpreter was used.    HPI Comments: Ashley Spencer is a 27 y.o. female who presents to the Emergency Department complaining of persistent pain to her third and fourth right fingers which began five days ago when her fingers were slammed in a door. One of her artificial nails was removed in the accident. She continues to experience pain to her fingers which she characterizes as "throbbing" and which she rates as 10/10. The pt was treated at the ED two days ago, and she was prescribed an antibiotic because the fingers were red and swollen. She was also prescribed Vicodin. However, she reports she has not noticed any improvement with the antibiotic, and she cannot afford the pain medication. The pt is a current smoker.     Past Medical History  Diagnosis Date  . No pertinent past medical history   . Trichomonas    Past Surgical History  Procedure Laterality Date  . Dilation and curettage of uterus     No family history on file. History  Substance Use Topics  . Smoking status: Current Every Day Smoker -- 1.00 packs/day for 3 years    Types: Cigarettes  . Smokeless tobacco: Never Used  . Alcohol Use: No   OB History   Grav Para Term Preterm Abortions TAB SAB Ect Mult Living   5 3 3  2 2    3      Review of Systems  Musculoskeletal: Positive for myalgias.  All other systems reviewed and are negative.    Allergies  Review of patient's allergies indicates no known allergies.  Home Medications   Current Outpatient Rx   Name  Route  Sig  Dispense  Refill  . cephALEXin (KEFLEX) 500 MG capsule   Oral   Take 1 capsule (500 mg total) by mouth 4 (four) times daily.   40 capsule   0   . naproxen sodium (ANAPROX) 220 MG tablet   Oral   Take 220 mg by mouth 2 (two) times daily as needed (for pain). For pain          Triage Vitals: BP 115/74  Pulse 76  Temp(Src) 97.7 F (36.5 C) (Oral)  Resp 18  Ht 5' (1.524 m)  Wt 107 lb (48.535 kg)  BMI 20.90 kg/m2  SpO2 97%  LMP 05/22/2013  Physical Exam  Nursing note and vitals reviewed. Constitutional: She is oriented to person, place, and time. She appears well-developed and well-nourished. No distress.  HENT:  Head: Normocephalic and atraumatic.  Eyes: EOM are normal.  Neck: Neck supple. No tracheal deviation present.  Cardiovascular: Normal rate.   Good cap refill.   Pulmonary/Chest: Effort normal. No respiratory distress.  Musculoskeletal: Normal range of motion.  Neurological: She is alert and oriented to person, place, and time. She displays normal reflexes.  Skin: Skin is warm and dry. No erythema.  Mild swelling to distal right middle finger. Partial avulstion distally with clear drainage from nail bed. Finger tender to palpation.  No  signs of erythema. No purulent drainage seen.    Psychiatric: She has a normal mood and affect. Her behavior is normal.    ED Course  Procedures (including critical care time)  DIAGNOSTIC STUDIES: Oxygen Saturation is 97% on room air, normal by my interpretation.    COORDINATION OF CARE:  2:36 PM- Discussed treatment plan with patient, which includes the pt filling her pain medication, and the patient agreed to the plan. Encouraged the pt to alternate icing her fingers and soaking the fingers in a warm bath. Also reminded the pt to finish the current course of antibiotics.   Labs Review Labs Reviewed - No data to display  Imaging Review No results found.  EKG Interpretation   None       MDM   1.  Injury of middle finger, right, initial encounter   2. Fingernail avulsion, initial encounter     Patient is here with pain in the distal right middle finger after jamming her hand in the door and then partially avulsing her fingernail. She was told she may have infection under her fingernail and she was started on Keflex. Although patient does have swelling to the distal finger and tenderness I do not think it is infected. There is no purulent drainage, there is no erythema, nail bed does have some scabbing and some clear drainage with pressure however no signs of infection at this time. I instructed her to continue on Keflex. She did not fill her pain medication last time so I instructed her to fill her pain medicine. Discharge her wound care at home with warm soaks and alternate with ice for swelling. Urgency department. She did have x-rays done at last visit and there were normal.  Filed Vitals:   06/01/13 1237 06/01/13 1501  BP: 115/74 109/67  Pulse: 76 76  Temp: 97.7 F (36.5 C)   TempSrc: Oral   Resp: 18   Height: 5' (1.524 m)   Weight: 107 lb (48.535 kg)   SpO2: 97% 100%    I personally performed the services described in this documentation, which was scribed in my presence. The recorded information has been reviewed and is accurate.    Lottie Mussel, PA-C 06/01/13 1513

## 2013-06-01 NOTE — ED Notes (Signed)
Pt. Stated, i was here 2 days ago and was given an antibiotic and Im not any better and its very painful.  I could not afford my pain medications they gave me.

## 2013-06-02 NOTE — ED Provider Notes (Signed)
Results for orders placed during the hospital encounter of 11/03/12  WET PREP, GENITAL      Result Value Range   Yeast Wet Prep HPF POC NONE SEEN  NONE SEEN   Trich, Wet Prep NONE SEEN  NONE SEEN   Clue Cells Wet Prep HPF POC FEW (*) NONE SEEN   WBC, Wet Prep HPF POC FEW (*) NONE SEEN  GC/CHLAMYDIA PROBE AMP      Result Value Range   CT Probe RNA NEGATIVE  NEGATIVE   GC Probe RNA NEGATIVE  NEGATIVE  URINALYSIS, ROUTINE W REFLEX MICROSCOPIC      Result Value Range   Color, Urine YELLOW  YELLOW   APPearance CLEAR  CLEAR   Specific Gravity, Urine 1.020  1.005 - 1.030   pH 6.5  5.0 - 8.0   Glucose, UA NEGATIVE  NEGATIVE mg/dL   Hgb urine dipstick NEGATIVE  NEGATIVE   Bilirubin Urine NEGATIVE  NEGATIVE   Ketones, ur NEGATIVE  NEGATIVE mg/dL   Protein, ur NEGATIVE  NEGATIVE mg/dL   Urobilinogen, UA 0.2  0.0 - 1.0 mg/dL   Nitrite NEGATIVE  NEGATIVE   Leukocytes, UA NEGATIVE  NEGATIVE  POCT PREGNANCY, URINE      Result Value Range   Preg Test, Ur NEGATIVE  NEGATIVE   Dg Hand Complete Right  05/30/2013   CLINICAL DATA:  Close right hand in car door.  EXAM: RIGHT HAND - COMPLETE 3+ VIEW  COMPARISON:  None.  FINDINGS: There is no evidence of fracture or dislocation. There is no evidence of arthropathy or other focal bone abnormality. Soft tissues are unremarkable.  IMPRESSION: Negative.   Electronically Signed   By: Charlett Nose M.D.   On: 05/30/2013 13:30    Medical screening examination/treatment/procedure(s) were conducted as a shared visit with non-physician practitioner(s) and myself.  I personally evaluated the patient during the encounter.  EKG Interpretation   None       Pt w recent contusion finger tips in car door. xr neg for fx. Discussed tx/wound care.   Suzi Roots, MD 06/02/13 [phone]

## 2013-06-02 NOTE — ED Provider Notes (Signed)
Medical screening examination/treatment/procedure(s) were performed by non-physician practitioner and as supervising physician I was immediately available for consultation/collaboration.  EKG Interpretation   None         [name], MD 06/02/13 0847 

## 2013-08-14 ENCOUNTER — Encounter (HOSPITAL_COMMUNITY): Payer: Self-pay | Admitting: Emergency Medicine

## 2013-08-14 ENCOUNTER — Emergency Department (HOSPITAL_COMMUNITY)
Admission: EM | Admit: 2013-08-14 | Discharge: 2013-08-14 | Disposition: A | Discharge status: Home / Self Care | Payer: Medicaid Other | Attending: Emergency Medicine | Admitting: Emergency Medicine

## 2013-08-14 DIAGNOSIS — Z8739 Personal history of other diseases of the musculoskeletal system and connective tissue: Secondary | ICD-10-CM | POA: Insufficient documentation

## 2013-08-14 DIAGNOSIS — Z8619 Personal history of other infectious and parasitic diseases: Secondary | ICD-10-CM | POA: Insufficient documentation

## 2013-08-14 DIAGNOSIS — IMO0002 Reserved for concepts with insufficient information to code with codable children: Secondary | ICD-10-CM | POA: Insufficient documentation

## 2013-08-14 DIAGNOSIS — M792 Neuralgia and neuritis, unspecified: Secondary | ICD-10-CM

## 2013-08-14 DIAGNOSIS — F17200 Nicotine dependence, unspecified, uncomplicated: Secondary | ICD-10-CM | POA: Insufficient documentation

## 2013-08-14 MED ORDER — NAPROXEN 500 MG PO TABS: 500.0000 mg | ORAL_TABLET | Freq: Two times a day (BID) | ORAL | Status: DC

## 2013-08-14 MED ORDER — NAPROXEN 250 MG PO TABS
500.0000 mg | ORAL_TABLET | Freq: Once | ORAL | Status: AC
  Administered 2013-08-14: 500 mg via ORAL
  Filled 2013-08-14: qty 2

## 2013-08-14 NOTE — ED Notes (Signed)
Ortho tech at the bedside.  

## 2013-08-14 NOTE — ED Notes (Addendum)
Pt reports chronic right hand pain since July 2012. States she was diagnosed with arthritis and referred to a hand specialist at that time but was unable to go due to insurance issues. States that she can now follow up with a hand specialist. States that her left hand is now hurting, as well. Denies injuring either hand.

## 2013-08-14 NOTE — ED Provider Notes (Signed)
CSN: [id number]     Arrival date & time 08/14/13  1617 History   This chart was scribed for Noland Fordyce, PA, working with Mervin Kung, MD, by Elby Beck ED Scribe. This patient was seen in room TR07C/TR07C and the patient's care was started at 6:17 PM.   Chief Complaint  Patient presents with  . Hand Pain    The history is provided by the patient. No language interpreter was used.    HPI Comments: Ashley Spencer is a right-handed 28 y.o. female who presents to the Emergency Department complaining of constant left 4th finger numbness onset 4 days ago. She also reports having an intermittent burning sensation to the finger over the past few days, but does not characterize this sensation as painful. She denies any injury to have onset this numbness/burning. She reports that she has been diagnosed with arthritis in the past while having similar symptoms. Has not tried anything at home for symptoms but does report she had a hand splint last year when she had similar pain and this did help some.  She reports that she was not able to follow-up with a hand specialist as advised at that time due to insurance issues, but that she is now able to follow-up.  She denies neck pain, wrist pain, elbow pain or any other pain or symptoms. Denies fever, n/v/d.  Pt is right hand dominant.    Past Medical History  Diagnosis Date  . No pertinent past medical history   . Trichomonas    Past Surgical History  Procedure Laterality Date  . Dilation and curettage of uterus     History reviewed. No pertinent family history. History  Substance Use Topics  . Smoking status: Current Every Day Smoker -- 1.00 packs/day for 3 years    Types: Cigarettes  . Smokeless tobacco: Never Used  . Alcohol Use: No   OB History   Grav Para Term Preterm Abortions TAB SAB Ect Mult Living   5 3 3  2 2    3      Review of Systems  Musculoskeletal: Negative for neck pain.       Denies wrist pain, denies elbow pain   Neurological: Positive for numbness (left 4th finger).  All other systems reviewed and are negative.   Allergies  Review of patient's allergies indicates no known allergies.  Home Medications   Current Outpatient Rx  Name  Route  Sig  Dispense  Refill  . naproxen sodium (ANAPROX) 220 MG tablet   Oral   Take 220 mg by mouth 2 (two) times daily as needed (for pain). For pain         . naproxen (NAPROSYN) 500 MG tablet   Oral   Take 1 tablet (500 mg total) by mouth 2 (two) times daily.   30 tablet   0    Triage Vitals: BP 103/65  Pulse 89  Temp(Src) 98.3 F (36.8 C) (Oral)  Resp 16  Ht 5' (1.524 m)  Wt 107 lb (48.535 kg)  BMI 20.90 kg/m2  SpO2 99%  Physical Exam  Nursing note and vitals reviewed. Constitutional: She is oriented to person, place, and time. She appears well-developed and well-nourished.  HENT:  Head: Normocephalic and atraumatic.  Eyes: EOM are normal.  Neck: Normal range of motion.  Cardiovascular: Normal rate.   Pulmonary/Chest: Effort normal.  Musculoskeletal: Normal range of motion. She exhibits tenderness. She exhibits no edema.  Mild tenderness along radial aspect of distal and middle  phalanges of left 4th digit. Full ROM. No edema, ecchymosis or erythema. Sensation to light touch intact. Cap refill less than 2. Radial pulse 2+. Full ROM left wrist and left elbow , without tenderness. No cervical spinal tenderness, step-offs or crepitus.  Neurological: She is alert and oriented to person, place, and time.  Skin: Skin is warm and dry.  Psychiatric: She has a normal mood and affect. Her behavior is normal.    ED Course  Procedures (including critical care time)  DIAGNOSTIC STUDIES: Oxygen Saturation is 99% on RA, normal by my interpretation.    COORDINATION OF CARE: 6:21 PM- Discussed plan to refer pt to a Hand specialist. Will also apply a finger splint and discharge with Naprosyn. Pt advised of plan for treatment and pt  agrees.,  Medications  naproxen (NAPROSYN) tablet 500 mg (500 mg Oral Given 08/14/13 1843)   Labs Review Labs Reviewed - No data to display Imaging Review No results found.   EKG Interpretation None      MDM   Final diagnoses:  Neuropathic pain of finger of left hand    Pt c/o intermittent numbness and burning of left ring finger. No known injuries. Pt is right hand dominant.  On exam, no obvious injury, deformity, or underlying infection.  Per request of pt will place in finger splint for comfort. Rx: naproxen. Advised to f/u with Dr. Lenon Curt, hand surgery, for continued hand pain.  Return precautions provided. Pt verbalized understanding and agreement with tx plan.   I personally performed the services described in this documentation, which was scribed in my presence. The recorded information has been reviewed and is accurate.   Noland Fordyce, PA-C 08/15/13 0110

## 2013-08-14 NOTE — ED Notes (Signed)
Pt states her L 4th finger has been numb since Monday. Denies any injuries. Skin is w/d, skin color WNL, cap refill <2.

## 2013-08-14 NOTE — ED Notes (Signed)
Waiting on ortho tech to apply a static splint to right fingers

## 2013-08-14 NOTE — Progress Notes (Signed)
Orthopedic Tech Progress Note Patient Details:  Ashley Spencer [birth year] [id number]  Ortho Devices Type of Ortho Device: Finger splint Ortho Device/Splint Location: L UE Ortho Device/Splint Interventions: Application   Tresha Muzio T 08/14/2013, 6:53 PM

## 2013-08-15 NOTE — ED Provider Notes (Signed)
Medical screening examination/treatment/procedure(s) were performed by non-physician practitioner and as supervising physician I was immediately available for consultation/collaboration.   EKG Interpretation None        Chole Driver W. Breane Grunwald, MD 08/15/13 1934 

## 2013-11-25 ENCOUNTER — Encounter (HOSPITAL_COMMUNITY): Payer: Self-pay | Admitting: Emergency Medicine

## 2013-11-25 ENCOUNTER — Emergency Department (HOSPITAL_COMMUNITY)
Admission: EM | Admit: 2013-11-25 | Discharge: 2013-11-25 | Disposition: A | Discharge status: Home / Self Care | Payer: Medicaid Other | Attending: Emergency Medicine | Admitting: Emergency Medicine

## 2013-11-25 ENCOUNTER — Emergency Department (HOSPITAL_COMMUNITY): Payer: Medicaid Other

## 2013-11-25 DIAGNOSIS — S6980XA Other specified injuries of unspecified wrist, hand and finger(s), initial encounter: Secondary | ICD-10-CM | POA: Insufficient documentation

## 2013-11-25 DIAGNOSIS — M129 Arthropathy, unspecified: Secondary | ICD-10-CM | POA: Insufficient documentation

## 2013-11-25 DIAGNOSIS — Z8619 Personal history of other infectious and parasitic diseases: Secondary | ICD-10-CM | POA: Insufficient documentation

## 2013-11-25 DIAGNOSIS — F17200 Nicotine dependence, unspecified, uncomplicated: Secondary | ICD-10-CM | POA: Insufficient documentation

## 2013-11-25 DIAGNOSIS — S6990XA Unspecified injury of unspecified wrist, hand and finger(s), initial encounter: Secondary | ICD-10-CM | POA: Insufficient documentation

## 2013-11-25 DIAGNOSIS — W1809XA Striking against other object with subsequent fall, initial encounter: Secondary | ICD-10-CM | POA: Insufficient documentation

## 2013-11-25 DIAGNOSIS — M79644 Pain in right finger(s): Secondary | ICD-10-CM

## 2013-11-25 DIAGNOSIS — Y9389 Activity, other specified: Secondary | ICD-10-CM | POA: Insufficient documentation

## 2013-11-25 DIAGNOSIS — W010XXA Fall on same level from slipping, tripping and stumbling without subsequent striking against object, initial encounter: Secondary | ICD-10-CM | POA: Insufficient documentation

## 2013-11-25 DIAGNOSIS — S4980XA Other specified injuries of shoulder and upper arm, unspecified arm, initial encounter: Secondary | ICD-10-CM | POA: Insufficient documentation

## 2013-11-25 DIAGNOSIS — Y9289 Other specified places as the place of occurrence of the external cause: Secondary | ICD-10-CM | POA: Insufficient documentation

## 2013-11-25 DIAGNOSIS — M25512 Pain in left shoulder: Secondary | ICD-10-CM

## 2013-11-25 DIAGNOSIS — S46909A Unspecified injury of unspecified muscle, fascia and tendon at shoulder and upper arm level, unspecified arm, initial encounter: Secondary | ICD-10-CM | POA: Insufficient documentation

## 2013-11-25 DIAGNOSIS — Z791 Long term (current) use of non-steroidal anti-inflammatories (NSAID): Secondary | ICD-10-CM | POA: Insufficient documentation

## 2013-11-25 HISTORY — DX: Unspecified osteoarthritis, unspecified site: M19.90

## 2013-11-25 MED ORDER — KETOROLAC TROMETHAMINE 60 MG/2ML IM SOLN
60.0000 mg | Freq: Once | INTRAMUSCULAR | Status: AC
  Administered 2013-11-25: 60 mg via INTRAMUSCULAR
  Filled 2013-11-25: qty 2

## 2013-11-25 MED ORDER — MELOXICAM 7.5 MG PO TABS: 15.0000 mg | ORAL_TABLET | Freq: Every day | ORAL | Status: DC

## 2013-11-25 NOTE — ED Notes (Signed)
Pt slipped at [facility]; hit left side. NO LOC. Pt reports hand and shoulder pain. Full ROM with shoulder and hand.

## 2013-11-25 NOTE — ED Provider Notes (Signed)
CSN: [id number]     Arrival date & time 11/25/13  1337 History  This chart was scribed for non-physician practitioner, Noland Fordyce, PA-C working with Neta Ehlers, MD by Frederich Balding, ED scribe. This patient was seen in room TR04C/TR04C and the patient's care was started at 3:15 PM.   Chief Complaint  Patient presents with  . Shoulder Pain   The history is provided by the patient. No language interpreter was used.   HPI Comments: Ashley Spencer is a 28 y.o. female who presents to the Emergency Department complaining of left shoulder injury. Pt states she [place]park and fell from level ground, landed on her left side on concrete yesterday while playing with her daughter. States she hit her head on the ground but denies LOC, change in vision, nausea or vomiting. She has sudden onset, worsening left shoulder pain and right hand pain. Left shoulder pain is 10/10 at worst. Right finger pain is 5/10. Denies numbness or tingling. Certain movements worsen the pain. She has taken aleve with little relief. Denies numbness or tingling in hands. Denies prior shoulder injury. Pt is right hand dominant.   Past Medical History  Diagnosis Date  . No pertinent past medical history   . Trichomonas   . Arthritis    Past Surgical History  Procedure Laterality Date  . Dilation and curettage of uterus     History reviewed. No pertinent family history. History  Substance Use Topics  . Smoking status: Current Every Day Smoker -- 1.00 packs/day for 3 years    Types: Cigarettes  . Smokeless tobacco: Never Used  . Alcohol Use: No   OB History   Grav Para Term Preterm Abortions TAB SAB Ect Mult Living   5 3 3  2 2    3      Review of Systems  Musculoskeletal: Positive for arthralgias.  Neurological: Negative for numbness.  All other systems reviewed and are negative.  Allergies  Review of patient's allergies indicates no known allergies.  Home Medications   Prior to Admission  medications   Medication Sig Start Date End Date Taking? Authorizing Provider  naproxen sodium (ANAPROX) 220 MG tablet Take 220 mg by mouth 2 (two) times daily as needed (for pain). For pain   Yes Historical Provider, MD  meloxicam (MOBIC) 7.5 MG tablet Take 2 tablets (15 mg total) by mouth daily. 11/25/13   Noland Fordyce, PA-C   BP 119/70  Pulse 76  Temp(Src) 98.8 F (37.1 C) (Oral)  Resp 18  SpO2 99%  LMP 11/14/2013  Physical Exam  Nursing note and vitals reviewed. Constitutional: She is oriented to person, place, and time. She appears well-developed and well-nourished.  HENT:  Head: Normocephalic and atraumatic.  Eyes: EOM are normal.  Neck: Normal range of motion.  Cardiovascular: Normal rate.   Pulses:      Radial pulses are 2+ on the right side, and 2+ on the left side.  Cap refill <3 seconds all 10 fingers.  Pulmonary/Chest: Effort normal.  Musculoskeletal: Normal range of motion.  Left shoulder with no deformity. Full ROM.. Tenderness along deltoid. Full ROM of left elbow. 5/5 grip strength bilaterally. Mild edema to right index finger. Full ROM. Tenderness to right index finger. 5/5 grip strength bilaterally.   Neurological: She is alert and oriented to person, place, and time.  Skin: Skin is warm and dry.   One quarter-sized area of ecchymosis on left upper arm. Superficial abrasion to left shoulder and left  upper back  Psychiatric: She has a normal mood and affect. Her behavior is normal.    ED Course  Procedures (including critical care time)  DIAGNOSTIC STUDIES: Oxygen Saturation is 100% on RA, normal by my interpretation.    COORDINATION OF CARE: 3:18 PM-Discussed treatment plan which includes finger splint, pain medication and an anti-inflammatory with pt at bedside and pt agreed to plan. Advised pt to follow up with [facility].   Labs Review Labs Reviewed - No data to display  Imaging Review Dg Shoulder Left  11/25/2013   CLINICAL DATA:   Fall  EXAM: LEFT SHOULDER - 2+ VIEW  COMPARISON:  None.  FINDINGS: Glenohumeral joint is intact. No evidence of scapular fracture or humeral fracture. The acromioclavicular joint is intact.  IMPRESSION: No acute osseous abnormality.   Electronically Signed   By: Suzy Bouchard M.D.   On: 11/25/2013 14:38   Dg Hand Complete Right  11/25/2013   CLINICAL DATA:  Index finger pain after a fall.  EXAM: RIGHT HAND - COMPLETE 3+ VIEW  COMPARISON:  05/30/2013  FINDINGS: There is no evidence of fracture or dislocation. There is no evidence of arthropathy or other focal bone abnormality. Soft tissues are unremarkable.  IMPRESSION: Negative.   Electronically Signed   By: Sherryl Barters M.D.   On: 11/25/2013 14:39     EKG Interpretation None      MDM   Final diagnoses:  Left shoulder pain  Finger pain, right    pt c/o left shoulder and right index finger pain after slip and fall from level surface at [facility] yesterday. Denies LOC. Normal neuro exam. Right and left upper extremities neurovascularly in tact. Plain films: negative for acute bony injury.  Will place right index finger in splint as pt may have sprained it.  Advised to use antiinflammatories, and alternate ice and heat. F/u with [place].   I personally performed the services described in this documentation, which was scribed in my presence. The recorded information has been reviewed and is accurate.  Noland Fordyce, PA-C 11/26/13 [phone]

## 2013-11-27 NOTE — ED Provider Notes (Signed)
Medical screening examination/treatment/procedure(s) were performed by non-physician practitioner and as supervising physician I was immediately available for consultation/collaboration.   Neta Ehlers, MD 11/27/13 [phone]

## 2014-01-25 ENCOUNTER — Encounter (HOSPITAL_COMMUNITY): Payer: Self-pay | Admitting: Emergency Medicine

## 2014-01-25 ENCOUNTER — Emergency Department (HOSPITAL_COMMUNITY)
Admission: EM | Admit: 2014-01-25 | Discharge: 2014-01-25 | Discharge status: Against Medical Advice | Payer: Medicaid Other | Attending: Emergency Medicine | Admitting: Emergency Medicine

## 2014-01-25 DIAGNOSIS — G43909 Migraine, unspecified, not intractable, without status migrainosus: Secondary | ICD-10-CM | POA: Diagnosis present

## 2014-01-25 DIAGNOSIS — F17200 Nicotine dependence, unspecified, uncomplicated: Secondary | ICD-10-CM | POA: Diagnosis not present

## 2014-01-25 MED ORDER — OXYCODONE-ACETAMINOPHEN 5-325 MG PO TABS
1.0000 | ORAL_TABLET | Freq: Once | ORAL | Status: AC
  Administered 2014-01-25: 1 via ORAL
  Filled 2014-01-25: qty 1

## 2014-01-25 NOTE — ED Notes (Signed)
Pt st's she is leaving and going to [facility] st's she is in too much pain to wait any longer. Explained to pt that the wait might be as long at [facility].  Pt continues to say that she can't wait any longer.

## 2014-01-25 NOTE — ED Notes (Signed)
Pt in c/o migraine since earlier this afternoon, history of migraines in the past and states this feels similar. Denies n/v, no relief with OTC medication

## 2014-02-28 ENCOUNTER — Encounter (HOSPITAL_COMMUNITY): Payer: Self-pay | Admitting: Emergency Medicine

## 2014-02-28 ENCOUNTER — Emergency Department (HOSPITAL_COMMUNITY): Payer: Medicaid Other

## 2014-02-28 ENCOUNTER — Emergency Department (HOSPITAL_COMMUNITY)
Admission: EM | Admit: 2014-02-28 | Discharge: 2014-02-28 | Disposition: A | Discharge status: Home / Self Care | Payer: Medicaid Other | Attending: Emergency Medicine | Admitting: Emergency Medicine

## 2014-02-28 DIAGNOSIS — Z791 Long term (current) use of non-steroidal anti-inflammatories (NSAID): Secondary | ICD-10-CM | POA: Insufficient documentation

## 2014-02-28 DIAGNOSIS — J029 Acute pharyngitis, unspecified: Secondary | ICD-10-CM | POA: Insufficient documentation

## 2014-02-28 DIAGNOSIS — R519 Headache, unspecified: Secondary | ICD-10-CM

## 2014-02-28 DIAGNOSIS — F17200 Nicotine dependence, unspecified, uncomplicated: Secondary | ICD-10-CM | POA: Diagnosis not present

## 2014-02-28 DIAGNOSIS — R51 Headache: Secondary | ICD-10-CM | POA: Insufficient documentation

## 2014-02-28 DIAGNOSIS — Z8619 Personal history of other infectious and parasitic diseases: Secondary | ICD-10-CM | POA: Insufficient documentation

## 2014-02-28 DIAGNOSIS — M129 Arthropathy, unspecified: Secondary | ICD-10-CM | POA: Insufficient documentation

## 2014-02-28 DIAGNOSIS — F1410 Cocaine abuse, uncomplicated: Secondary | ICD-10-CM | POA: Insufficient documentation

## 2014-02-28 DIAGNOSIS — J039 Acute tonsillitis, unspecified: Secondary | ICD-10-CM

## 2014-02-28 LAB — RAPID STREP SCREEN (MED CTR MEBANE ONLY): Streptococcus, Group A Screen (Direct): NEGATIVE

## 2014-02-28 MED ORDER — AMOXICILLIN 500 MG PO CAPS: 500.0000 mg | ORAL_CAPSULE | Freq: Three times a day (TID) | ORAL | Status: DC

## 2014-02-28 MED ORDER — OXYCODONE-ACETAMINOPHEN 5-325 MG PO TABS
1.0000 | ORAL_TABLET | Freq: Once | ORAL | Status: AC
  Administered 2014-02-28: 1 via ORAL
  Filled 2014-02-28: qty 1

## 2014-02-28 NOTE — ED Notes (Signed)
Pt requested pain meds at D/C and requested to speak with PA.

## 2014-02-28 NOTE — ED Notes (Signed)
NP at bedside.

## 2014-02-28 NOTE — ED Notes (Signed)
Reports having cold symptoms for more 2 days. Has taken BC powder and Aleve.

## 2014-02-28 NOTE — ED Notes (Signed)
Pt reports having sore throat and fever since last night. Able to swallow but it is difficulty due to pain, also having headache. Airway intact at triage.

## 2014-02-28 NOTE — Discharge Instructions (Signed)
Your CT scan today is normal Take tylenol or ibuprofen for pain. Take th antibiotics as directed. Follow up with your doctor or return here as needed,

## 2014-02-28 NOTE — ED Provider Notes (Signed)
CSN: [id number]     Arrival date & time 02/28/14  1612 History   First MD Initiated Contact with Patient 02/28/14 1651     This chart was scribed for non-physician practitioner working with Ashley Bison Ward, DO by Forrestine Him, ED Scribe. This patient was seen in room TR05C/TR05C and the patient's care was started at 5:06 PM.   Chief Complaint  Patient presents with  . Sore Throat   The history is provided by the patient. No language interpreter was used.    HPI Comments: Ashley Spencer is a 28 y.o. female who presents to the Emergency Department complaining of a constant, moderate sore throat with associated chills x 1 day. Sore throat has progressively worsened since time of initial onset. She denies any inability to swallow but admits to associated pain when attempting to swallow. Pt also reports an intermittent throbbing/sharp HA  onset 2 weeks. No blurred vision or visaul disturbances associated with her HAs. She currently rates her HA 9/10. Pt has tried OTC BC Powders without any improvement for symptoms. She denies any fever, CP, or SOB. [name] admits to recently being exposed to strep throat. Pt also admits to intermittent cocaine abuse. Her friend with her state the patient has been up all night using Cocaine and now is coming down off of it. No known allergies to medications.   Past Medical History  Diagnosis Date  . No pertinent past medical history   . Trichomonas   . Arthritis    Past Surgical History  Procedure Laterality Date  . Dilation and curettage of uterus     History reviewed. No pertinent family history. History  Substance Use Topics  . Smoking status: Current Every Day Smoker -- 1.00 packs/day for 3 years    Types: Cigarettes  . Smokeless tobacco: Never Used  . Alcohol Use: No   OB History   Grav Para Term Preterm Abortions TAB SAB Ect Mult Living   5 3 3  2 2    3      Review of Systems  Constitutional: Positive for chills. Negative for fever.   HENT: Positive for sore throat.   Eyes: Negative for visual disturbance.  Respiratory: Negative for shortness of breath.   Cardiovascular: Negative for chest pain.  Neurological: Positive for headaches.  All other systems reviewed and are negative.  Allergies  Review of patient's allergies indicates no known allergies.  Home Medications   Prior to Admission medications   Medication Sig Start Date End Date Taking? Authorizing Provider  meloxicam (MOBIC) 7.5 MG tablet Take 2 tablets (15 mg total) by mouth daily. 11/25/13   Noland Fordyce, PA-C  naproxen sodium (ANAPROX) 220 MG tablet Take 220 mg by mouth 2 (two) times daily as needed (for pain). For pain    Historical Provider, MD   Triage Vitals: BP 142/99  Pulse 120  Temp(Src) 98.7 F (37.1 C) (Oral)  Resp 18  Ht 5' (1.524 m)  Wt 110 lb (49.896 kg)  BMI 21.48 kg/m2  SpO2 98%  LMP 02/13/2014   Physical Exam  Nursing note and vitals reviewed. Constitutional: She is oriented to person, place, and time. She appears well-developed and well-nourished.  HENT:  Head: Normocephalic.  Right Ear: Tympanic membrane normal.  Left Ear: Tympanic membrane normal.  Mouth/Throat: Uvula is midline. Posterior oropharyngeal erythema present. No oropharyngeal exudate, posterior oropharyngeal edema or tonsillar abscesses.  Swelling and erythema to L tonsil   Eyes: Conjunctivae and EOM are normal. Pupils are equal,  round, and reactive to light.  Neck: Normal range of motion.  Cardiovascular: Normal rate, regular rhythm and normal heart sounds.   Pulmonary/Chest: Effort normal and breath sounds normal.  Abdominal: She exhibits no distension.  Musculoskeletal: Normal range of motion.  Lymphadenopathy:    She has cervical adenopathy (Anterior cervical lymphadenopathy of L).  Neurological: She is alert and oriented to person, place, and time. She has normal strength. No sensory deficit. Gait normal.  Reflex Scores:      Patellar reflexes are 2+ on  the right side and 2+ on the left side.      Achilles reflexes are 2+ on the right side and 2+ on the left side. Psychiatric: She has a normal mood and affect.    ED Course  Procedures (including critical care time)  DIAGNOSTIC STUDIES: Oxygen Saturation is 98% on RA, Normal by my interpretation.    COORDINATION OF CARE: 5:05 PM- Will give Percocet. Will order CT Head without contrast and rapid strep screen. Discussed treatment plan with pt at bedside and pt agreed to plan.    Ct Head Wo Contrast  02/28/2014   CLINICAL DATA:  Sore throat and fever since last night, painful swallowing, also headache  EXAM: CT HEAD WITHOUT CONTRAST  TECHNIQUE: Contiguous axial images were obtained from the base of the skull through the vertex without intravenous contrast.  COMPARISON:  None.  FINDINGS: No mass lesion. No midline shift. No acute hemorrhage or hematoma. No extra-axial fluid collections. No evidence of acute infarction. Calvarium is intact.  IMPRESSION: Negative head CT   Electronically Signed   By: Skipper Cliche M.D.   On: 02/28/2014 18:30    Labs Review  MDM  28 y.o. female with sore throat and headache x 2 days. Stable for discharge with normal CT scan. Will treat for pharyngitis since she was exposed to strep will start antibiotics while culture pending.   I personally performed the services described in this documentation, which was scribed in my presence. The recorded information has been reviewed and is accurate.    [place], [place] 03/02/14 [phone]

## 2014-02-28 NOTE — ED Notes (Signed)
Declined W/C at D/C and was escorted to lobby by RN. 

## 2014-03-02 ENCOUNTER — Emergency Department (HOSPITAL_COMMUNITY)
Admission: EM | Admit: 2014-03-02 | Discharge: 2014-03-02 | Disposition: A | Discharge status: Home / Self Care | Payer: Medicaid Other | Attending: Emergency Medicine | Admitting: Emergency Medicine

## 2014-03-02 ENCOUNTER — Encounter (HOSPITAL_COMMUNITY): Payer: Self-pay | Admitting: Emergency Medicine

## 2014-03-02 DIAGNOSIS — R51 Headache: Secondary | ICD-10-CM | POA: Diagnosis not present

## 2014-03-02 DIAGNOSIS — M129 Arthropathy, unspecified: Secondary | ICD-10-CM | POA: Diagnosis not present

## 2014-03-02 DIAGNOSIS — Z792 Long term (current) use of antibiotics: Secondary | ICD-10-CM | POA: Insufficient documentation

## 2014-03-02 DIAGNOSIS — F17200 Nicotine dependence, unspecified, uncomplicated: Secondary | ICD-10-CM | POA: Insufficient documentation

## 2014-03-02 DIAGNOSIS — Z8619 Personal history of other infectious and parasitic diseases: Secondary | ICD-10-CM | POA: Insufficient documentation

## 2014-03-02 DIAGNOSIS — R519 Headache, unspecified: Secondary | ICD-10-CM

## 2014-03-02 LAB — CULTURE, GROUP A STREP

## 2014-03-02 MED ORDER — MORPHINE SULFATE 4 MG/ML IJ SOLN
6.0000 mg | Freq: Once | INTRAMUSCULAR | Status: AC
  Administered 2014-03-02: 6 mg via INTRAVENOUS
  Filled 2014-03-02: qty 2

## 2014-03-02 MED ORDER — HYDROCODONE-ACETAMINOPHEN 5-325 MG PO TABS: 1.0000 | ORAL_TABLET | ORAL | Status: DC | PRN

## 2014-03-02 MED ORDER — KETOROLAC TROMETHAMINE 30 MG/ML IJ SOLN
30.0000 mg | Freq: Once | INTRAMUSCULAR | Status: AC
  Administered 2014-03-02: 30 mg via INTRAVENOUS
  Filled 2014-03-02: qty 1

## 2014-03-02 MED ORDER — METOCLOPRAMIDE HCL 5 MG/ML IJ SOLN
10.0000 mg | Freq: Once | INTRAMUSCULAR | Status: AC
  Administered 2014-03-02: 10 mg via INTRAVENOUS
  Filled 2014-03-02: qty 2

## 2014-03-02 NOTE — ED Provider Notes (Signed)
CSN: [id number]     Arrival date & time 03/02/14  1062 History   First MD Initiated Contact with Patient 03/02/14 1017     Chief Complaint  Patient presents with  . Headache      HPI Patient presents with ongoing headache over the past 2 weeks.  Her pain seems to come and go and is located in the left side of her head.  She denies nausea vomiting.  Some photophobia.  She's tried over-the-counter medications without improvement in her symptoms.  She did initially of sore throat but that seems to have resolved   Past Medical History  Diagnosis Date  . No pertinent past medical history   . Trichomonas   . Arthritis    Past Surgical History  Procedure Laterality Date  . Dilation and curettage of uterus     No family history on file. History  Substance Use Topics  . Smoking status: Current Every Day Smoker -- 1.00 packs/day for 3 years    Types: Cigarettes  . Smokeless tobacco: Never Used  . Alcohol Use: No   OB History   Grav Para Term Preterm Abortions TAB SAB Ect Mult Living   5 3 3  2 2    3      Review of Systems  All other systems reviewed and are negative.     Allergies  Review of patient's allergies indicates no known allergies.  Home Medications   Prior to Admission medications   Medication Sig Start Date End Date Taking? Authorizing Provider  amoxicillin (AMOXIL) 500 MG capsule Take 500 mg by mouth 3 (three) times daily.   Yes Historical Provider, MD  ibuprofen (ADVIL,MOTRIN) 100 MG tablet Take 400 mg by mouth every 6 (six) hours as needed for pain (headache).   Yes Historical Provider, MD  naproxen sodium (ANAPROX) 220 MG tablet Take 220 mg by mouth 2 (two) times daily as needed (for headache). For pain   Yes Historical Provider, MD  HYDROcodone-acetaminophen (NORCO/VICODIN) 5-325 MG per tablet Take 1 tablet by mouth every 4 (four) hours as needed for moderate pain. 03/02/14   Hoy Morn, MD   BP 156/101  Pulse 95  Temp(Src) 98.7 F (37.1 C) (Oral)   Resp 18  SpO2 100%  LMP 02/13/2014 Physical Exam  Nursing note and vitals reviewed. Constitutional: She is oriented to person, place, and time. She appears well-developed and well-nourished. No distress.  HENT:  Head: Normocephalic and atraumatic.  Eyes: EOM are normal. Pupils are equal, round, and reactive to light.  Neck: Normal range of motion.  Cardiovascular: Normal rate, regular rhythm and normal heart sounds.   Pulmonary/Chest: Effort normal and breath sounds normal.  Abdominal: Soft. She exhibits no distension. There is no tenderness.  Musculoskeletal: Normal range of motion.  Neurological: She is alert and oriented to person, place, and time.  5/5 strength in major muscle groups of  bilateral upper and lower extremities. Speech normal. No facial asymetry.   Skin: Skin is warm and dry.  Psychiatric: She has a normal mood and affect. Judgment normal.    ED Course  Procedures (including critical care time) Labs Review Labs Reviewed - No data to display  Imaging Review Ct Head Wo Contrast  02/28/2014   CLINICAL DATA:  Sore throat and fever since last night, painful swallowing, also headache  EXAM: CT HEAD WITHOUT CONTRAST  TECHNIQUE: Contiguous axial images were obtained from the base of the skull through the vertex without intravenous contrast.  COMPARISON:  None.  FINDINGS: No mass lesion. No midline shift. No acute hemorrhage or hematoma. No extra-axial fluid collections. No evidence of acute infarction. Calvarium is intact.  IMPRESSION: Negative head CT   Electronically Signed   By: Skipper Cliche M.D.   On: 02/28/2014 18:30  I personally reviewed the imaging tests through PACS system I reviewed available ER/hospitalization records through the EMR     EKG Interpretation None      MDM   Final diagnoses:  Acute nonintractable headache, unspecified headache type    Patient is overall well-appearing.  She feels much better after management of her pain in the emergency  apartment.  Discharge home in good condition.    Hoy Morn, MD 03/02/14 1240

## 2014-03-02 NOTE — ED Provider Notes (Signed)
Medical screening examination/treatment/procedure(s) were performed by non-physician practitioner and as supervising physician I was immediately available for consultation/collaboration.   EKG Interpretation None        [place], DO 03/02/14 [phone]

## 2014-03-02 NOTE — ED Notes (Signed)
Per pt, states left sided headache on and off for 2 weeks-increased pain this am

## 2014-03-02 NOTE — ED Notes (Signed)
Patient was educated not to drive, operate heavy machinery, or drink alcohol while taking narcotic medication.  

## 2014-03-13 ENCOUNTER — Emergency Department (HOSPITAL_COMMUNITY)
Admission: EM | Admit: 2014-03-13 | Discharge: 2014-03-13 | Disposition: A | Discharge status: Home / Self Care | Payer: Medicaid Other | Attending: Emergency Medicine | Admitting: Emergency Medicine

## 2014-03-13 ENCOUNTER — Encounter (HOSPITAL_COMMUNITY): Payer: Self-pay | Admitting: Emergency Medicine

## 2014-03-13 DIAGNOSIS — Z8619 Personal history of other infectious and parasitic diseases: Secondary | ICD-10-CM | POA: Insufficient documentation

## 2014-03-13 DIAGNOSIS — Z720 Tobacco use: Secondary | ICD-10-CM | POA: Diagnosis not present

## 2014-03-13 DIAGNOSIS — M1990 Unspecified osteoarthritis, unspecified site: Secondary | ICD-10-CM | POA: Insufficient documentation

## 2014-03-13 DIAGNOSIS — R519 Headache, unspecified: Secondary | ICD-10-CM

## 2014-03-13 DIAGNOSIS — R51 Headache: Secondary | ICD-10-CM | POA: Diagnosis present

## 2014-03-13 MED ORDER — KETOROLAC TROMETHAMINE 30 MG/ML IJ SOLN
30.0000 mg | Freq: Once | INTRAMUSCULAR | Status: AC
  Administered 2014-03-13: 30 mg via INTRAMUSCULAR
  Filled 2014-03-13: qty 1

## 2014-03-13 MED ORDER — METOCLOPRAMIDE HCL 5 MG/ML IJ SOLN
10.0000 mg | Freq: Once | INTRAMUSCULAR | Status: AC
  Administered 2014-03-13: 10 mg via INTRAMUSCULAR
  Filled 2014-03-13: qty 2

## 2014-03-13 MED ORDER — NAPROXEN 375 MG PO TABS: 375.0000 mg | ORAL_TABLET | Freq: Two times a day (BID) | ORAL | Status: DC

## 2014-03-13 NOTE — ED Notes (Signed)
Pt reports migraine that started 1 hr PTA. Pt sts that it woke her out of sleep and is getting worse. Dr. Ashok Cordia at bedside

## 2014-03-13 NOTE — ED Notes (Signed)
Pt rang call bell and sts that her HA is gone and she needs her d/c papers so she can leave. Dr Ashok Cordia notified

## 2014-03-13 NOTE — Discharge Instructions (Signed)
It was our pleasure to provide your ER care today - we hope that you feel better.  Rest. Drink plenty of fluids. Take naproxen as need. Follow up with neurologist, as currently arranged.  Return to ER if worse, new symptoms, fevers, worsening or severe pain, persistent vomiting, other concern.     General Headache Without Cause A headache is pain or discomfort felt around the head or neck area. The specific cause of a headache may not be found. There are many causes and types of headaches. A few common ones are:  Tension headaches.  Migraine headaches.  Cluster headaches.  Chronic daily headaches. HOME CARE INSTRUCTIONS   Keep all follow-up appointments with your caregiver or any specialist referral.  Only take over-the-counter or prescription medicines for pain or discomfort as directed by your caregiver.  Lie down in a dark, quiet room when you have a headache.  Keep a headache journal to find out what may trigger your migraine headaches. For example, write down:  What you eat and drink.  How much sleep you get.  Any change to your diet or medicines.  Try massage or other relaxation techniques.  Put ice packs or heat on the head and neck. Use these 3 to 4 times per day for 15 to 20 minutes each time, or as needed.  Limit stress.  Sit up straight, and do not tense your muscles.  Quit smoking if you smoke.  Limit alcohol use.  Decrease the amount of caffeine you drink, or stop drinking caffeine.  Eat and sleep on a regular schedule.  Get 7 to 9 hours of sleep, or as recommended by your caregiver.  Keep lights dim if bright lights bother you and make your headaches worse. SEEK MEDICAL CARE IF:   You have problems with the medicines you were prescribed.  Your medicines are not working.  You have a change from the usual headache.  You have nausea or vomiting. SEEK IMMEDIATE MEDICAL CARE IF:   Your headache becomes severe.  You have a fever.  You  have a stiff neck.  You have loss of vision.  You have muscular weakness or loss of muscle control.  You start losing your balance or have trouble walking.  You feel faint or pass out.  You have severe symptoms that are different from your first symptoms. MAKE SURE YOU:   Understand these instructions.  Will watch your condition.  Will get help right away if you are not doing well or get worse. Document Released: 05/28/2005 Document Revised: 08/20/2011 Document Reviewed: 06/13/2011 [facility] Patient Information 2015 [place], [place]. This information is not intended to replace advice given to you by your health care provider. Make sure you discuss any questions you have with your health care provider.

## 2014-03-13 NOTE — ED Notes (Signed)
Pt arrived to the ED with a compliant of a headache.  Pt has had headache for three weeks.  Pt seen for same and given prescription medications but has not obtained relief.  Pt is set to see a neurologist in October but headache woke her out of her sleep twice today

## 2014-03-13 NOTE — ED Provider Notes (Signed)
CSN: [id number]     Arrival date & time 03/13/14  2585 History   First MD Initiated Contact with Patient 03/13/14 [phone]     Chief Complaint  Patient presents with  . Headache     (Consider location/radiation/quality/duration/timing/severity/associated sxs/prior Treatment) Patient is a 28 y.o. female presenting with headaches. The history is provided by the patient and a friend.  Headache Associated symptoms: no abdominal pain, no back pain, no congestion, no cough, no pain, no fever, no neck pain, no neck stiffness, no numbness, no sinus pressure and no vomiting   pt c/o intermittent frontal headaches for the past 3-4 weeks. States almost daily. Dull, frontal, esp left, mod-sev, gradual onset.  States headache may be present all day, not consistently worse during any certain point of day. Not related to position, whether upright or supine. Not related to specific activity, no specific exacerbating or alleviating factors. A times bothered by bright light and loud noise. No eye pain or change in vision. No neck pain or stiffness. No recent head injury, trauma or fall. No syncope or alteration of normal loc or function. No sinus congestion, drainage or pressure. No uri c/o. No fever or chills. Mild nausea at times. Denies prior hx chronic headaches or migraines.      Past Medical History  Diagnosis Date  . No pertinent past medical history   . Trichomonas   . Arthritis    Past Surgical History  Procedure Laterality Date  . Dilation and curettage of uterus     History reviewed. No pertinent family history. History  Substance Use Topics  . Smoking status: Current Every Day Smoker -- 1.00 packs/day for 3 years    Types: Cigarettes  . Smokeless tobacco: Never Used  . Alcohol Use: No   OB History   Grav Para Term Preterm Abortions TAB SAB Ect Mult Living   5 3 3  2 2    3      Review of Systems  Constitutional: Negative for fever and chills.  HENT: Negative for congestion and sinus  pressure.   Eyes: Negative for pain, redness and visual disturbance.  Respiratory: Negative for cough and shortness of breath.   Cardiovascular: Negative for chest pain.  Gastrointestinal: Negative for vomiting and abdominal pain.  Endocrine: Negative for polyuria.  Genitourinary: Negative for flank pain.  Musculoskeletal: Negative for back pain, neck pain and neck stiffness.  Skin: Negative for rash.  Neurological: Positive for headaches. Negative for syncope, weakness and numbness.  Hematological: Does not bruise/bleed easily.  Psychiatric/Behavioral: Negative for confusion.      Allergies  Review of patient's allergies indicates no known allergies.  Home Medications   Prior to Admission medications   Medication Sig Start Date End Date Taking? Authorizing Provider  HYDROcodone-acetaminophen (NORCO/VICODIN) 5-325 MG per tablet Take 1 tablet by mouth every 4 (four) hours as needed for moderate pain. 03/02/14  Yes Hoy Morn, MD  ibuprofen (ADVIL,MOTRIN) 100 MG tablet Take 400 mg by mouth every 6 (six) hours as needed for pain (headache).   Yes Historical Provider, MD  naproxen sodium (ANAPROX) 220 MG tablet Take 220 mg by mouth 2 (two) times daily as needed (for headache). For pain   Yes Historical Provider, MD   BP 129/92  Pulse 78  Temp(Src) 98.1 F (36.7 C) (Oral)  Resp 18  SpO2 100%  LMP 03/08/2014 Physical Exam  Nursing note and vitals reviewed. Constitutional: She is oriented to person, place, and time. She appears well-developed and well-nourished. No  distress.  HENT:  Head: Atraumatic.  Nose: Nose normal.  Mouth/Throat: Oropharynx is clear and moist.  No sinus or temporal tenderness.  Eyes: Conjunctivae and EOM are normal. Pupils are equal, round, and reactive to light. No scleral icterus.  Neck: Neck supple. No tracheal deviation present. No thyromegaly present.  No stiffness or rigidity.   Cardiovascular: Normal rate, regular rhythm, normal heart sounds and  intact distal pulses.  Exam reveals no gallop and no friction rub.   No murmur heard. Pulmonary/Chest: Effort normal and breath sounds normal. No respiratory distress.  Abdominal: Soft. Normal appearance and bowel sounds are normal. She exhibits no distension. There is no tenderness.  Genitourinary:  No cva tenderness.  Musculoskeletal: Normal range of motion. She exhibits no edema and no tenderness.  Neurological: She is alert and oriented to person, place, and time. She displays normal reflexes. No cranial nerve deficit. She exhibits normal muscle tone.  Motor intact bilaterally, stre 5/5, sens intact. Steady gait.   Skin: Skin is warm and dry. No rash noted. She is not diaphoretic.  Psychiatric: She has a normal mood and affect.    ED Course  Procedures (including critical care time) Labs Review   MDM  Reviewed nursing notes and prior charts for additional history.   Pt had ct head on 02/28/14 for c/o same type of headache, neg for acute process.  Pt states her hydrocodone doesn't help, and that in past naprosyn did provide relief.  toradol im. reglan im.  Recheck headache resolved.  Pt requests d/c.    Mirna Mires, MD 03/13/14 [phone]

## 2014-03-16 ENCOUNTER — Emergency Department (HOSPITAL_COMMUNITY)
Admission: EM | Admit: 2014-03-16 | Discharge: 2014-03-17 | Disposition: A | Discharge status: Home / Self Care | Payer: Medicaid Other

## 2014-03-17 ENCOUNTER — Ambulatory Visit: Payer: Medicaid Other | Admitting: Family Medicine

## 2014-03-17 ENCOUNTER — Telehealth: Payer: Self-pay | Admitting: General Practice

## 2014-03-17 NOTE — Telephone Encounter (Signed)
Pt. Came into appt late on 03/17/2014, unable to reschedule new pt. appt due to limited availability. Pt. Was asked to call back on the following day to try and make another appointment for the following week.

## 2014-03-18 ENCOUNTER — Emergency Department (HOSPITAL_COMMUNITY)
Admission: EM | Admit: 2014-03-18 | Discharge: 2014-03-18 | Disposition: A | Discharge status: Home / Self Care | Payer: Medicaid Other | Attending: Emergency Medicine | Admitting: Emergency Medicine

## 2014-03-18 ENCOUNTER — Encounter (HOSPITAL_COMMUNITY): Payer: Self-pay | Admitting: Emergency Medicine

## 2014-03-18 DIAGNOSIS — Z8619 Personal history of other infectious and parasitic diseases: Secondary | ICD-10-CM | POA: Diagnosis not present

## 2014-03-18 DIAGNOSIS — R519 Headache, unspecified: Secondary | ICD-10-CM

## 2014-03-18 DIAGNOSIS — Z720 Tobacco use: Secondary | ICD-10-CM | POA: Diagnosis not present

## 2014-03-18 DIAGNOSIS — M1990 Unspecified osteoarthritis, unspecified site: Secondary | ICD-10-CM | POA: Diagnosis not present

## 2014-03-18 DIAGNOSIS — H53149 Visual discomfort, unspecified: Secondary | ICD-10-CM | POA: Diagnosis not present

## 2014-03-18 DIAGNOSIS — R51 Headache: Secondary | ICD-10-CM | POA: Insufficient documentation

## 2014-03-18 MED ORDER — NAPROXEN 500 MG PO TABS: 500.0000 mg | ORAL_TABLET | Freq: Two times a day (BID) | ORAL | Status: DC

## 2014-03-18 MED ORDER — NAPROXEN 500 MG PO TABS
500.0000 mg | ORAL_TABLET | Freq: Once | ORAL | Status: AC
  Administered 2014-03-18: 500 mg via ORAL
  Filled 2014-03-18: qty 1

## 2014-03-18 NOTE — ED Notes (Signed)
Per pt states headache on and off for weeks-states out of naproxen which has hepled

## 2014-03-18 NOTE — ED Provider Notes (Signed)
CSN: [id number]     Arrival date & time 03/18/14  1430 History  This chart was scribed for non-physician practitioner working with No att. providers found by Mercy [place], ED Scribe. This patient was seen in room WTR5/WTR5 and the patient's care was started at 2:53 PM.   Chief Complaint  Patient presents with  . Headache   The history is provided by the patient. No language interpreter was used.   HPI Comments: Ashley Spencer is a 28 y.o. female who presents to the Emergency Department complaining of recurrent left frontal throbbing headache, . Patient seen 03/13/14 for same and prescribed Naproxen 500 mg. Patient has run out of the medication and is requesting another prescription because it is the only thing that she has found that provides relief. Patient reports presentation of her current headache this morning; patient states that it woke her up from her sleep. Patient reports associated photophobia and left eye watering. Pt does state HA feels similar to previous and has gradually worsened.  Patient denies nausea, vomiting, blurred vision, neck pain, fever, numbness or tingling. Patient states that she has a scheduled appointment next week at the [facility]. Patient has not been evaluated by a neurologist and desires referral.   Past Medical History  Diagnosis Date  . No pertinent past medical history   . Trichomonas   . Arthritis    Past Surgical History  Procedure Laterality Date  . Dilation and curettage of uterus     No family history on file. History  Substance Use Topics  . Smoking status: Current Every Day Smoker -- 1.00 packs/day for 3 years    Types: Cigarettes  . Smokeless tobacco: Never Used  . Alcohol Use: No   OB History   Grav Para Term Preterm Abortions TAB SAB Ect Mult Living   5 3 3  2 2    3      Review of Systems  Constitutional: Negative for fever and chills.  Eyes: Positive for photophobia. Negative for pain and visual disturbance.   Gastrointestinal: Negative for nausea, vomiting and diarrhea.  Musculoskeletal: Negative for neck pain.  Neurological: Positive for headaches. Negative for weakness and numbness.   Allergies  Review of patient's allergies indicates no known allergies.  Home Medications   Prior to Admission medications   Medication Sig Start Date End Date Taking? Authorizing Provider  HYDROcodone-acetaminophen (NORCO/VICODIN) 5-325 MG per tablet Take 1 tablet by mouth every 4 (four) hours as needed for moderate pain. 03/02/14   Hoy Morn, MD  ibuprofen (ADVIL,MOTRIN) 100 MG tablet Take 400 mg by mouth every 6 (six) hours as needed for pain (headache).    Historical Provider, MD  naproxen (NAPROSYN) 500 MG tablet Take 1 tablet (500 mg total) by mouth 2 (two) times daily. 03/18/14   Noland Fordyce, PA-C   Triage Vitals: BP 119/87  Pulse 75  Temp(Src) 98.2 F (36.8 C) (Oral)  Resp 18  SpO2 100%  LMP 03/08/2014  Physical Exam  Nursing note and vitals reviewed. Constitutional: She is oriented to person, place, and time. She appears well-developed and well-nourished. No distress.  Pt sitting in darkened exam room. NAD  HENT:  Head: Normocephalic and atraumatic.  Eyes: Conjunctivae and EOM are normal. Pupils are equal, round, and reactive to light.  Neck: Normal range of motion. Neck supple.  No nuchal rigidity or meningeal signs.  Cardiovascular: Normal rate.   Pulmonary/Chest: Effort normal. No respiratory distress.  Musculoskeletal: Normal range of motion.  Neurological: She  is alert and oriented to person, place, and time. She has normal strength. No cranial nerve deficit or sensory deficit. She displays a negative Romberg sign. Coordination and gait normal. GCS eye subscore is 4. GCS verbal subscore is 5. GCS motor subscore is 6.  Mental Status:  Alert, oriented, thought content appropriate. Speech fluent without evidence of aphasia. Able to follow 2 step commands without difficulty.  Cranial  Nerves: II-XII grossly in tact. Motor:  5/5 in upper and lower extremities bilaterally including strong and equal grip strength and dorsiflexion/plantar flexion Sensory: normal in all extremities.  Deep Tendon Reflexes: 2+ and symmetric  Cerebellar: normal finger-to-nose with bilateral upper extremities Gait: normal gait and balance   Skin: Skin is warm and dry.  Psychiatric: She has a normal mood and affect. Her behavior is normal.    ED Course  Procedures (including critical care time)  COORDINATION OF CARE: 3:02 PM- Patient will update Naproxen 500 mg prescription and refer to neurologist. Discussed treatment plan with patient at bedside and patient agreed to plan.   Labs Review Labs Reviewed - No data to display  Imaging Review No results found.   EKG Interpretation None      MDM   Final diagnoses:  Nonintractable episodic headache, unspecified headache type   Pt presenting to ED with gradual onset of headache earlier today. Feels similar to previous headaches. States out of her naproxen.  Not concerned for [facility] or meningitis. Naproxen 500mg  given in ED. Rx: naproxen. Advised to f/u with PCP next week as previously scheduled. Return precautions provided. Pt verbalized understanding and agreement with tx plan.  I personally performed the services described in this documentation, which was scribed in my presence. The recorded information has been reviewed and is accurate.    Noland Fordyce, PA-C 03/18/14 [phone]

## 2014-03-19 NOTE — ED Provider Notes (Signed)
Medical screening examination/treatment/procedure(s) were performed by non-physician practitioner and as supervising physician I was immediately available for consultation/collaboration.   EKG Interpretation None        Blanchie Dessert, MD 03/19/14 1256

## 2014-03-20 ENCOUNTER — Emergency Department (HOSPITAL_COMMUNITY)
Admission: EM | Admit: 2014-03-20 | Discharge: 2014-03-20 | Discharge status: Against Medical Advice | Payer: Medicaid Other | Attending: Emergency Medicine | Admitting: Emergency Medicine

## 2014-03-20 ENCOUNTER — Encounter (HOSPITAL_COMMUNITY): Payer: Self-pay | Admitting: Emergency Medicine

## 2014-03-20 DIAGNOSIS — R51 Headache: Secondary | ICD-10-CM | POA: Insufficient documentation

## 2014-03-20 NOTE — ED Notes (Signed)
This nurse and registration staff have been in patient's room and patient not in room ED Charge aware

## 2014-03-20 NOTE — ED Notes (Addendum)
Initial contact-patient tearful and c/o "stabbing" headache pain (left anterior). Denies blurry vision. Also has had severe nausea similar to past pregnancies. No emesis occurences today. Denies vaginal bleeding. Denies diarrhea. Patient is approximately [redacted] weeks pregnant-unsure of exact conception date. Not currently receiving prenatal care. Took Aleve (0900) and Tylenol (1340) this morning around 0900 this morning. No alleviation of symptoms. Patient ambulatory and moving all extremities. Speaking full/clear sentences. Face symmetrical. No other questions/concerns.

## 2014-03-20 NOTE — ED Notes (Signed)
Unable to locate patient in room, hallways, bathroom ED Charge aware Patient left ED without being seen

## 2014-03-22 ENCOUNTER — Ambulatory Visit (INDEPENDENT_AMBULATORY_CARE_PROVIDER_SITE_OTHER): Payer: Medicaid Other | Admitting: Diagnostic Neuroimaging

## 2014-03-22 ENCOUNTER — Encounter: Payer: Self-pay | Admitting: Diagnostic Neuroimaging

## 2014-03-22 VITALS — BP 110/78 | HR 81 | Temp 97.7°F | Ht 60.0 in | Wt 108.4 lb

## 2014-03-22 DIAGNOSIS — G43009 Migraine without aura, not intractable, without status migrainosus: Secondary | ICD-10-CM

## 2014-03-22 NOTE — Patient Instructions (Signed)
I evaluated you for headaches, which may be migraine headaches versus pregnancy associated headaches versus headaches associated with stress/depression/anxiety.  Monitor symptoms. You may use Tylenol as needed for headache treatment.  After your procedure on10/27/15, follow up here for further evaluation and treatment.

## 2014-03-22 NOTE — Progress Notes (Signed)
[facility]  PATIENT: Ashley Spencer DOB: May 18, 1986  REFERRING CLINICIAN: ER HISTORY FROM: patient (accompanied by son) REASON FOR VISIT: new consult    HISTORICAL  CHIEF COMPLAINT:  Chief Complaint  Patient presents with  . Headache    L side    HISTORY OF PRESENT ILLNESS:   28 year old right-handed female here for evaluation of headaches.  For past 1 month patient has had intermittent left temporal, throbbing, severe headaches associated with nausea, vomiting, phonophobia. Patient also just found that she is [redacted] weeks pregnant. She is not planning to carry pregnancy to term. She has an appointment with women's clinic on 04/06/14 for abortion. Currently patient is using naproxen 1-2 tablets per day for headache relief. She tried Tylenol which has not helped. She also tried hydrocodone which has not helped. Patient also having stress/depression/anxiety and other factors in her life.   Patient reports family history of migraine headaches her father when he was 26 years old.   REVIEW OF SYSTEMS: Full 14 system review of systems performed and notable only for  fevers chills constipation itching aching muscles depression anxiety change in appetite headache enlarged lymph nodes feeling hot feeling cold.   ALLERGIES: No Known Allergies  HOME MEDICATIONS: Outpatient Prescriptions Prior to Visit  Medication Sig Dispense Refill  . acetaminophen (TYLENOL) 500 MG tablet Take 1,000 mg by mouth every 6 (six) hours as needed for moderate pain (pain).      . naproxen (NAPROSYN) 500 MG tablet Take 1 tablet (500 mg total) by mouth 2 (two) times daily.  30 tablet  0  . HYDROcodone-acetaminophen (NORCO/VICODIN) 5-325 MG per tablet Take 1 tablet by mouth every 6 (six) hours as needed for moderate pain or severe pain (pain).       . naproxen sodium (ANAPROX) 220 MG tablet Take 440 mg by mouth 2 (two) times daily with a meal.       No facility-administered medications prior  to visit.    PAST MEDICAL HISTORY: Past Medical History  Diagnosis Date  . No pertinent past medical history   . Trichomonas   . Arthritis     PAST SURGICAL HISTORY: Past Surgical History  Procedure Laterality Date  . Dilation and curettage of uterus      FAMILY HISTORY: Family History  Problem Relation Age of Onset  . Migraines Father 52  . Hypertension Brother   . Hypertension Maternal Aunt   . Hypertension Maternal Uncle   . Hypertension Maternal Grandmother     SOCIAL HISTORY:  History   Social History  . Marital Status: Significant Other    Spouse Name: N/A    Number of Children: 3  . Years of Education: 11th   Occupational History  .  Other    n/a   Social History Main Topics  . Smoking status: Former Smoker -- 1.00 packs/day for 3 years    Types: Cigarettes  . Smokeless tobacco: Never Used  . Alcohol Use: No  . Drug Use: No  . Sexual Activity: Yes    Birth Control/ Protection: None   Other Topics Concern  . Not on file   Social History Narrative   Patient lives at home with her grandmother.   Caffeine Use: 1 cup every other morning     PHYSICAL EXAM  Filed Vitals:   03/22/14 1542  BP: 110/78  Pulse: 81  Temp: 97.7 F (36.5 C)  TempSrc: Oral  Height: 5' (1.524 m)  Weight: 108 lb 6.4 oz (49.17  kg)    Not recorded    Body mass index is 21.17 kg/(m^2).  GENERAL EXAM: Patient is in no distress; well developed, nourished and groomed; neck is supple  CARDIOVASCULAR: Regular rate and rhythm, no murmurs, no carotid bruits  NEUROLOGIC: MENTAL STATUS: awake, alert, oriented to person, place and time, recent and remote memory intact, normal attention and concentration, language fluent, comprehension intact, naming intact, fund of knowledge appropriate CRANIAL NERVE: no papilledema on fundoscopic exam, pupils equal and reactive to light, visual fields full to confrontation, extraocular muscles intact, no nystagmus, facial sensation and  strength symmetric, hearing intact, palate elevates symmetrically, uvula midline, shoulder shrug symmetric, tongue midline. MOTOR: normal bulk and tone, full strength in the BUE, BLE SENSORY: normal and symmetric to light touch, pinprick, temperature, vibration COORDINATION: finger-nose-finger, fine finger movements normal REFLEXES: deep tendon reflexes present and symmetric GAIT/STATION: narrow based gait; able to walk on toes, heels and tandem; romberg is negative    DIAGNOSTIC DATA (LABS, IMAGING, TESTING) - I reviewed patient records, labs, notes, testing and imaging myself where available.  Lab Results  Component Value Date   WBC 7.1 09/26/2010   HGB 11.5* 09/26/2010   HCT 35.5* 09/26/2010   MCV 82.4 09/26/2010   PLT 357 09/26/2010   No results found for this basename: na, k, cl, co2, glucose, bun, creatinine, calcium, prot, albumin, ast, alt, alkphos, bilitot, gfrnonaa, gfraa   No results found for this basename: CHOL, HDL, LDLCALC, LDLDIRECT, TRIG, CHOLHDL   No results found for this basename: HGBA1C   No results found for this basename: VITAMINB12   No results found for this basename: TSH    I reviewed images myself and agree with interpretation. -VRP  CT head - normal   ASSESSMENT AND PLAN  28 y.o. year old female here with new onset headaches since past 1 month; also found out she is ~ [redacted] weeks pregnant. Most likely represents migraine HA assoc with pregnancy. She is planning to have an abortion. I recommend wait until after procedure, then consider migraine specific medications (such as topiramate + triptan).  PLAN: - follow up after procedure on 04/06/14 - then consider topiramate and triptan  Return in about 2 weeks (around 04/08/2014).    Penni Bombard, MD 59/93/5701, 7:79 PM Certified in Neurology, Neurophysiology and Neuroimaging  [facility] [address], [place], [place] [address] [phone]

## 2014-04-05 ENCOUNTER — Ambulatory Visit: Payer: Medicaid Other | Admitting: Diagnostic Neuroimaging

## 2014-04-08 ENCOUNTER — Encounter: Payer: Self-pay | Admitting: Diagnostic Neuroimaging

## 2014-04-12 ENCOUNTER — Encounter: Payer: Self-pay | Admitting: Diagnostic Neuroimaging

## 2014-05-03 ENCOUNTER — Emergency Department (HOSPITAL_COMMUNITY): Payer: Medicaid Other

## 2014-05-03 ENCOUNTER — Encounter (HOSPITAL_COMMUNITY): Payer: Self-pay | Admitting: Family Medicine

## 2014-05-03 ENCOUNTER — Emergency Department (HOSPITAL_COMMUNITY)
Admission: EM | Admit: 2014-05-03 | Discharge: 2014-05-03 | Disposition: A | Discharge status: Home / Self Care | Payer: Medicaid Other | Attending: Emergency Medicine | Admitting: Emergency Medicine

## 2014-05-03 DIAGNOSIS — S8391XA Sprain of unspecified site of right knee, initial encounter: Secondary | ICD-10-CM | POA: Diagnosis not present

## 2014-05-03 DIAGNOSIS — Z8619 Personal history of other infectious and parasitic diseases: Secondary | ICD-10-CM | POA: Diagnosis not present

## 2014-05-03 DIAGNOSIS — W010XXA Fall on same level from slipping, tripping and stumbling without subsequent striking against object, initial encounter: Secondary | ICD-10-CM | POA: Diagnosis not present

## 2014-05-03 DIAGNOSIS — Y998 Other external cause status: Secondary | ICD-10-CM | POA: Insufficient documentation

## 2014-05-03 DIAGNOSIS — Y9389 Activity, other specified: Secondary | ICD-10-CM | POA: Insufficient documentation

## 2014-05-03 DIAGNOSIS — Y9289 Other specified places as the place of occurrence of the external cause: Secondary | ICD-10-CM | POA: Diagnosis not present

## 2014-05-03 DIAGNOSIS — M1990 Unspecified osteoarthritis, unspecified site: Secondary | ICD-10-CM | POA: Diagnosis not present

## 2014-05-03 DIAGNOSIS — Z87891 Personal history of nicotine dependence: Secondary | ICD-10-CM | POA: Diagnosis not present

## 2014-05-03 DIAGNOSIS — S8991XA Unspecified injury of right lower leg, initial encounter: Secondary | ICD-10-CM | POA: Diagnosis present

## 2014-05-03 DIAGNOSIS — Z791 Long term (current) use of non-steroidal anti-inflammatories (NSAID): Secondary | ICD-10-CM | POA: Diagnosis not present

## 2014-05-03 DIAGNOSIS — W19XXXA Unspecified fall, initial encounter: Secondary | ICD-10-CM

## 2014-05-03 MED ORDER — NAPROXEN 500 MG PO TABS
500.0000 mg | ORAL_TABLET | Freq: Two times a day (BID) | ORAL | Status: DC | PRN
Start: 2014-05-03 — End: 2014-08-29

## 2014-05-03 MED ORDER — HYDROCODONE-ACETAMINOPHEN 5-325 MG PO TABS: 1.0000 | ORAL_TABLET | Freq: Four times a day (QID) | ORAL | Status: DC | PRN

## 2014-05-03 NOTE — Discharge Instructions (Signed)
Wear knee sleeve for at least 2 weeks for stabilization of knee. Use crutches as needed for comfort. Ice and elevate knee throughout the day. Alternate between naprosyn and norco for pain relief. Do not drive or operate machinery with pain medication use. Call orthopedic follow up today or tomorrow to schedule followup appointment for recheck of ongoing knee pain in one to two weeks that can be canceled with a 24-48 hour notice if complete resolution of pain. Return to the ER for changes or worsening symptoms.   Knee Sprain A knee sprain is a tear in the strong bands of tissue that connect the bones (ligaments) of your knee. HOME CARE  Raise (elevate) your injured knee to lessen puffiness (swelling).  To ease pain and puffiness, put ice on the injured area.  Put ice in a plastic bag.  Place a towel between your skin and the bag.  Leave the ice on for 20 minutes, 2-3 times a day.  Only take medicine as told by your doctor.  Do not leave your knee unprotected until pain and stiffness go away (usually 4-6 weeks).  If you have a cast or splint, do not get it wet. If your doctor told you to not take it off, cover it with a plastic bag when you shower or bathe. Do not swim.  Your doctor may have you do exercises to prevent or limit permanent weakness and stiffness. GET HELP RIGHT AWAY IF:   Your cast or splint becomes damaged.  Your pain gets worse.  You have a lot of pain, puffiness, or numbness below the cast or splint. MAKE SURE YOU:   Understand these instructions.  Will watch your condition.  Will get help right away if you are not doing well or get worse. Document Released: 05/16/2009 Document Revised: 06/02/2013 Document Reviewed: 02/03/2013 [facility] Patient Information 2015 [place], [place]. This information is not intended to replace advice given to you by your health care provider. Make sure you discuss any questions you have with your health care  provider.  Cryotherapy Cryotherapy is when you put ice on your injury. Ice helps lessen pain and puffiness (swelling) after an injury. Ice works the best when you start using it in the first 24 to 48 hours after an injury. HOME CARE  Put a dry or damp towel between the ice pack and your skin.  You may press gently on the ice pack.  Leave the ice on for no more than 10 to 20 minutes at a time.  Check your skin after 5 minutes to make sure your skin is okay.  Rest at least 20 minutes between ice pack uses.  Stop using ice when your skin loses feeling (numbness).  Do not use ice on someone who cannot tell you when it hurts. This includes small children and people with memory problems (dementia). GET HELP RIGHT AWAY IF:  You have white spots on your skin.  Your skin turns blue or pale.  Your skin feels waxy or hard.  Your puffiness gets worse. MAKE SURE YOU:   Understand these instructions.  Will watch your condition.  Will get help right away if you are not doing well or get worse. Document Released: 11/14/2007 Document Revised: 08/20/2011 Document Reviewed: 01/18/2011 [facility] Patient Information 2015 [place], [place]. This information is not intended to replace advice given to you by your health care provider. Make sure you discuss any questions you have with your health care provider.

## 2014-05-03 NOTE — ED Provider Notes (Signed)
CSN: [id number]     Arrival date & time 05/03/14  1251 History  This chart was scribed for non-physician practitioner, Zacarias Pontes, PA-C, working with Francine Graven, DO by Ladene Artist, ED Scribe. This patient was seen in room TR09C/TR09C and the patient's care was started at 1:20 PM.   Chief Complaint  Patient presents with  . Leg Pain   Patient is a 28 y.o. female presenting with leg pain. The history is provided by the patient. No language interpreter was used.  Leg Pain Location:  Leg Time since incident:  20 minutes Injury: yes   Mechanism of injury: fall   Fall:    Fall occurred:  Standing and tripped   Impact surface:  Grass   Point of impact:  Knees   Entrapped after fall: no   Leg location:  R leg Pain details:    Quality:  Aching, sharp and throbbing   Radiates to:  Does not radiate   Severity:  Moderate   Onset quality:  Sudden   Duration: 27mins.   Timing:  Constant   Progression:  Unchanged Chronicity:  New Dislocation: no   Prior injury to area:  No Relieved by:  Nothing Worsened by:  Bearing weight Ineffective treatments:  Acetaminophen Associated symptoms: no back pain, no decreased ROM, no muscle weakness, no neck pain, no numbness, no stiffness, no swelling and no tingling    HPI Comments: Ashley Spencer is a 28 y.o. female who presents to the Emergency Department complaining of R knee injury sustained approximately 20 minutes PTA. Pt states that she was chased by a dog and fell on her R knee after a mechanical fall from tripping, landing on her R knee. Ambulatory after incident. Pt describes her pain as a constant sharp, aching, throbbing sensation that she rates as 8/10. Pain located to medial aspect of R knee, and does not radiate. Pain is exacerbated with bearing weight. She has tried Tylenol without relief. Pt denies numbness/tingling, weakness, back pain, hitting her head, LOC, swelling, wounds, color change, or other injuries.   Past  Medical History  Diagnosis Date  . No pertinent past medical history   . Trichomonas   . Arthritis    Past Surgical History  Procedure Laterality Date  . Dilation and curettage of uterus     Family History  Problem Relation Age of Onset  . Migraines Father 36  . Hypertension Brother   . Hypertension Maternal Aunt   . Hypertension Maternal Uncle   . Hypertension Maternal Grandmother    History  Substance Use Topics  . Smoking status: Former Smoker -- 1.00 packs/day for 3 years    Types: Cigarettes  . Smokeless tobacco: Never Used  . Alcohol Use: No   OB History    Gravida Para Term Preterm AB TAB SAB Ectopic Multiple Living   5 3 3  2 2    3      Review of Systems  HENT: Negative for facial swelling (no head injury).   Musculoskeletal: Positive for myalgias and arthralgias. Negative for back pain, joint swelling, gait problem, stiffness and neck pain.  Skin: Negative for color change and wound.  Neurological: Negative for syncope, weakness, numbness and headaches.  Psychiatric/Behavioral: Negative for confusion.  10 Systems reviewed and all are negative for acute change except as noted in the HPI.  Allergies  Review of patient's allergies indicates no known allergies.  Home Medications   Prior to Admission medications   Medication Sig Start Date End  Date Taking? Authorizing Provider  acetaminophen (TYLENOL) 500 MG tablet Take 1,000 mg by mouth every 6 (six) hours as needed for moderate pain (pain).    Historical Provider, MD  naproxen (NAPROSYN) 500 MG tablet Take 1 tablet (500 mg total) by mouth 2 (two) times daily. 03/18/14   Noland Fordyce, PA-C   Triage Vitals: BP 105/65 mmHg  Pulse 70  Temp(Src) 98.1 F (36.7 C) (Oral)  Resp 20  SpO2 100% Physical Exam  Constitutional: She is oriented to person, place, and time. Vital signs are normal. She appears well-developed and well-nourished.  Non-toxic appearance. No distress.  HENT:  Head: Normocephalic and  atraumatic.  Mouth/Throat: Mucous membranes are normal.  Eyes: Conjunctivae and EOM are normal. Right eye exhibits no discharge. Left eye exhibits no discharge.  Neck: Normal range of motion. Neck supple.  Cardiovascular: Normal rate and intact distal pulses.   Distal pulses intact  Pulmonary/Chest: Effort normal. No respiratory distress.  Abdominal: Normal appearance. She exhibits no distension.  Musculoskeletal: Normal range of motion.       Right knee: She exhibits normal range of motion, no swelling, no effusion, no deformity, no erythema, normal alignment, no LCL laxity, normal patellar mobility and no MCL laxity. Tenderness found. Medial joint line and MCL tenderness noted.  All spinal levels with no bony tenderness to palpation, no crepitus or midline step-offs, full range of motion intact. Right knee with full range of motion intact, no swelling or effusion, no deformity or erythema, no warmth, no abnormal alignment or patellar mobility, no varus or valgus laxity, with mild tenderness to palpation over MCL and medial joint line. No Baker's cyst. Strength 5/5 in all extremities, sensation grossly intact in all extremities, distal pulses intact.  Neurological: She is alert and oriented to person, place, and time. She has normal strength. No sensory deficit.  Skin: Skin is warm, dry and intact. No erythema.  Psychiatric: She has a normal mood and affect. Her behavior is normal.  Nursing note and vitals reviewed.  ED Course  Procedures (including critical care time) DIAGNOSTIC STUDIES: Oxygen Saturation is 100% on RA, normal by my interpretation.    COORDINATION OF CARE: 1:22 PM-Discussed treatment plan which includes XRs with pt at bedside and pt agreed to plan.   Labs Review Labs Reviewed - No data to display  Imaging Review Dg Knee Complete 4 Views Right  05/03/2014   CLINICAL DATA:  Fall. Medial RIGHT knee pain. Initial encounter. Knee trauma.  EXAM: RIGHT KNEE - COMPLETE 4+  VIEW  COMPARISON:  None.  FINDINGS: There is no evidence of fracture, dislocation, or joint effusion. There is no evidence of arthropathy or other focal bone abnormality. Soft tissues are unremarkable.  IMPRESSION: Negative.   Electronically Signed   By: Dereck Ligas M.D.   On: 05/03/2014 13:53     EKG Interpretation None      MDM   Final diagnoses:  Fall  Right knee sprain, initial encounter    28y/o female with R knee pain after fall. Mild TTP to medial aspect. No gross deformities. Neurovascularly intact with soft compartments. Xray obtained and neg for acute bony injury. Will treat as sprain. Will apply knee sleeve and give crutches as needed for comfort. Discussed RICE therapy, and given pain meds with ortho f/up as needed in 1-2wks for ongoing pain. I explained the diagnosis and have given explicit precautions to return to the ER including for any other new or worsening symptoms. The patient understands and accepts the  medical plan as it's been dictated and I have answered their questions. Discharge instructions concerning home care and prescriptions have been given. The patient is STABLE and is discharged to home in good condition.   I personally performed the services described in this documentation, which was scribed in my presence. The recorded information has been reviewed and is accurate.  BP 105/65 mmHg  Pulse 70  Temp(Src) 98.1 F (36.7 C) (Oral)  Resp 20  SpO2 100%  LMP 04/10/2014  Meds ordered this encounter  Medications  . HYDROcodone-acetaminophen (NORCO) 5-325 MG per tablet    Sig: Take 1-2 tablets by mouth every 6 (six) hours as needed for severe pain.    Dispense:  6 tablet    Refill:  0    Order Specific Question:  Supervising Provider    Answer:  Noemi Chapel D [[id number]]  . naproxen (NAPROSYN) 500 MG tablet    Sig: Take 1 tablet (500 mg total) by mouth 2 (two) times daily as needed for mild pain, moderate pain or headache (TAKE WITH MEALS.).    Dispense:   20 tablet    Refill:  0    Order Specific Question:  Supervising Provider    Answer:  Noemi Chapel D [[id number]]     [place], PA-C 05/03/14 [place], DO 05/04/14 1644

## 2014-05-03 NOTE — ED Notes (Signed)
Declined W/C at D/C and was escorted to lobby by RN. 

## 2014-05-03 NOTE — ED Notes (Signed)
Per pt sts fell on right leg about 20 minutes ago and is having pain and swelling.

## 2014-06-09 ENCOUNTER — Ambulatory Visit: Payer: Medicaid Other | Admitting: Diagnostic Neuroimaging

## 2014-07-18 ENCOUNTER — Encounter (HOSPITAL_COMMUNITY): Payer: Self-pay | Admitting: *Deleted

## 2014-07-18 ENCOUNTER — Emergency Department (HOSPITAL_COMMUNITY)
Admission: EM | Admit: 2014-07-18 | Discharge: 2014-07-18 | Disposition: A | Discharge status: Home / Self Care | Payer: Medicaid Other | Attending: Emergency Medicine | Admitting: Emergency Medicine

## 2014-07-18 DIAGNOSIS — J029 Acute pharyngitis, unspecified: Secondary | ICD-10-CM | POA: Insufficient documentation

## 2014-07-18 DIAGNOSIS — Z8619 Personal history of other infectious and parasitic diseases: Secondary | ICD-10-CM | POA: Diagnosis not present

## 2014-07-18 DIAGNOSIS — Z720 Tobacco use: Secondary | ICD-10-CM | POA: Insufficient documentation

## 2014-07-18 DIAGNOSIS — M1990 Unspecified osteoarthritis, unspecified site: Secondary | ICD-10-CM | POA: Insufficient documentation

## 2014-07-18 DIAGNOSIS — Z79899 Other long term (current) drug therapy: Secondary | ICD-10-CM | POA: Diagnosis not present

## 2014-07-18 HISTORY — DX: Headache, unspecified: R51.9

## 2014-07-18 HISTORY — DX: Headache: R51

## 2014-07-18 LAB — RAPID STREP SCREEN (MED CTR MEBANE ONLY): Streptococcus, Group A Screen (Direct): NEGATIVE

## 2014-07-18 MED ORDER — IBUPROFEN 800 MG PO TABS
800.0000 mg | ORAL_TABLET | Freq: Three times a day (TID) | ORAL | Status: DC
Start: 2014-07-18 — End: 2015-08-11

## 2014-07-18 MED ORDER — LIDOCAINE VISCOUS 2 % MT SOLN: 20.0000 mL | OROMUCOSAL | Status: DC | PRN

## 2014-07-18 MED ORDER — ACETAMINOPHEN 325 MG PO TABS
650.0000 mg | ORAL_TABLET | Freq: Once | ORAL | Status: AC
  Administered 2014-07-18: 650 mg via ORAL
  Filled 2014-07-18: qty 2

## 2014-07-18 MED ORDER — LIDOCAINE VISCOUS 2 % MT SOLN
15.0000 mL | Freq: Once | OROMUCOSAL | Status: AC
  Administered 2014-07-18: 15 mL via OROMUCOSAL
  Filled 2014-07-18: qty 15

## 2014-07-18 NOTE — Discharge Instructions (Signed)
Please follow up with your primary care physician in 1-2 days. If you do not have one please call the [facility] number listed above. Please alternate between Motrin and Tylenol every three hours for fevers and pain. Please read all discharge instructions and return precautions.   Pharyngitis Pharyngitis is redness, pain, and swelling (inflammation) of your pharynx.  CAUSES  Pharyngitis is usually caused by infection. Most of the time, these infections are from viruses (viral) and are part of a cold. However, sometimes pharyngitis is caused by bacteria (bacterial). Pharyngitis can also be caused by allergies. Viral pharyngitis may be spread from person to person by coughing, sneezing, and personal items or utensils (cups, forks, spoons, toothbrushes). Bacterial pharyngitis may be spread from person to person by more intimate contact, such as kissing.  SIGNS AND SYMPTOMS  Symptoms of pharyngitis include:   Sore throat.   Tiredness (fatigue).   Low-grade fever.   Headache.  Joint pain and muscle aches.  Skin rashes.  Swollen lymph nodes.  Plaque-like film on throat or tonsils (often seen with bacterial pharyngitis). DIAGNOSIS  Your health care provider will ask you questions about your illness and your symptoms. Your medical history, along with a physical exam, is often all that is needed to diagnose pharyngitis. Sometimes, a rapid strep test is done. Other lab tests may also be done, depending on the suspected cause.  TREATMENT  Viral pharyngitis will usually get better in 3-4 days without the use of medicine. Bacterial pharyngitis is treated with medicines that kill germs (antibiotics).  HOME CARE INSTRUCTIONS   Drink enough water and fluids to keep your urine clear or pale yellow.   Only take over-the-counter or prescription medicines as directed by your health care provider:   If you are prescribed antibiotics, make sure you finish them even if you start  to feel better.   Do not take aspirin.   Get lots of rest.   Gargle with 8 oz of salt water ( tsp of salt per 1 qt of water) as often as every 1-2 hours to soothe your throat.   Throat lozenges (if you are not at risk for choking) or sprays may be used to soothe your throat. SEEK MEDICAL CARE IF:   You have large, tender lumps in your neck.  You have a rash.  You cough up green, yellow-brown, or bloody spit. SEEK IMMEDIATE MEDICAL CARE IF:   Your neck becomes stiff.  You drool or are unable to swallow liquids.  You vomit or are unable to keep medicines or liquids down.  You have severe pain that does not go away with the use of recommended medicines.  You have trouble breathing (not caused by a stuffy nose). MAKE SURE YOU:   Understand these instructions.  Will watch your condition.  Will get help right away if you are not doing well or get worse. Document Released: 05/28/2005 Document Revised: 03/18/2013 Document Reviewed: 02/02/2013 [facility] Patient Information 2015 [place], [place]. This information is not intended to replace advice given to you by your health care provider. Make sure you discuss any questions you have with your health care provider.

## 2014-07-18 NOTE — ED Notes (Signed)
Pt reports sore throat and difficulty swallowing that began yesterday evening. Pt is handling secretions well and speaking complete sentences w/o difficulty. Denies fever or cough.

## 2014-07-18 NOTE — ED Provider Notes (Signed)
CSN: [id number]     Arrival date & time 07/18/14  2112 History  This chart was scribed for non-physician practitioner, Rodolph Bong, PA-C, working with Malvin Johns, MD by Lowella Petties, ED Scribe. The patient was seen in room WTR5/WTR5. Patient's care was started at 10:21 PM.   Chief Complaint  Patient presents with  . Sore Throat   Patient is a 29 y.o. female presenting with pharyngitis. The history is provided by the patient. No language interpreter was used.  Sore Throat This is a new problem. The current episode started yesterday. The problem occurs constantly. The problem has been gradually worsening. Pertinent negatives include no chest pain, no abdominal pain, no headaches and no shortness of breath. The symptoms are aggravated by eating, drinking and swallowing. Nothing relieves the symptoms. Treatments tried: NSAIDs. The treatment provided mild relief.    Past Medical History  Diagnosis Date  . No pertinent past medical history   . Trichomonas   . Arthritis   . Headache    Past Surgical History  Procedure Laterality Date  . Dilation and curettage of uterus     Family History  Problem Relation Age of Onset  . Migraines Father 57  . Hypertension Brother   . Hypertension Maternal Aunt   . Hypertension Maternal Uncle   . Hypertension Maternal Grandmother    History  Substance Use Topics  . Smoking status: Current Every Day Smoker -- 1.00 packs/day for 3 years    Types: Cigarettes  . Smokeless tobacco: Never Used  . Alcohol Use: No   OB History    Gravida Para Term Preterm AB TAB SAB Ectopic Multiple Living   5 3 3  2 2    3      Review of Systems  Constitutional: Negative for fever and chills.  HENT: Positive for sore throat.   Respiratory: Negative for shortness of breath.   Cardiovascular: Negative for chest pain.  Gastrointestinal: Negative for abdominal pain.  Neurological: Negative for headaches.  All other systems reviewed and are  negative.  Allergies  Review of patient's allergies indicates no known allergies.  Home Medications   Prior to Admission medications   Medication Sig Start Date End Date Taking? Authorizing Provider  naproxen (NAPROSYN) 500 MG tablet Take 1 tablet (500 mg total) by mouth 2 (two) times daily. 03/18/14  Yes Noland Fordyce, PA-C  Naproxen Sodium 220 MG CAPS Take 220 capsules by mouth once.   Yes Historical Provider, MD  acetaminophen (TYLENOL) 500 MG tablet Take 1,000 mg by mouth every 6 (six) hours as needed for moderate pain (pain).    Historical Provider, MD  HYDROcodone-acetaminophen (NORCO) 5-325 MG per tablet Take 1-2 tablets by mouth every 6 (six) hours as needed for severe pain. Patient not taking: Reported on 07/18/2014 05/03/14   Patty Sermons Camprubi-Soms, PA-C  ibuprofen (ADVIL,MOTRIN) 800 MG tablet Take 1 tablet (800 mg total) by mouth 3 (three) times daily. 07/18/14   Ramsha Lonigro L Wilson Dusenbery, PA-C  lidocaine (XYLOCAINE) 2 % solution Use as directed 20 mLs in the mouth or throat as needed for mouth pain. 07/18/14   Jerelle Virden L Suzanne Kho, PA-C  naproxen (NAPROSYN) 500 MG tablet Take 1 tablet (500 mg total) by mouth 2 (two) times daily as needed for mild pain, moderate pain or headache (TAKE WITH MEALS.). Patient not taking: Reported on 07/18/2014 05/03/14   Patty Sermons Camprubi-Soms, PA-C   Triage Vitals: BP 121/79 mmHg  Pulse 88  Temp(Src) 98.2 F (36.8 C) (Oral)  Resp  18  Ht 5' (1.524 m)  Wt 115 lb (52.164 kg)  BMI 22.46 kg/m2  SpO2 100%  LMP 07/04/2014 (Approximate) Physical Exam  Constitutional: She is oriented to person, place, and time. She appears well-developed and well-nourished. No distress.  HENT:  Head: Normocephalic and atraumatic.  Right Ear: External ear normal.  Left Ear: External ear normal.  Nose: Nose normal.  Mouth/Throat: Uvula is midline and mucous membranes are normal. Posterior oropharyngeal erythema present. No oropharyngeal exudate, posterior  oropharyngeal edema or tonsillar abscesses.  Eyes: Conjunctivae are normal.  Neck: Normal range of motion. Neck supple.  Cardiovascular: Normal rate, regular rhythm and normal heart sounds.   Pulmonary/Chest: Effort normal and breath sounds normal.  Abdominal: Soft.  Musculoskeletal: Normal range of motion.  Lymphadenopathy:    She has cervical adenopathy.  Neurological: She is alert and oriented to person, place, and time.  Skin: Skin is warm and dry. She is not diaphoretic.  Psychiatric: She has a normal mood and affect.  Nursing note and vitals reviewed.   ED Course  Procedures (including critical care time) Medications  acetaminophen (TYLENOL) tablet 650 mg (650 mg Oral Given 07/18/14 2155)  lidocaine (XYLOCAINE) 2 % viscous mouth solution 15 mL (15 mLs Mouth/Throat Given 07/18/14 2155)    DIAGNOSTIC STUDIES: Oxygen Saturation is 100% on room air, normal by my interpretation.    COORDINATION OF CARE: 10:25 PM-Discussed treatment plan which includes viscous xylocaine with pt at bedside and pt agreed to plan.   Labs Review Labs Reviewed  RAPID STREP SCREEN  CULTURE, GROUP A STREP    Imaging Review No results found.   EKG Interpretation None      MDM   Final diagnoses:  Viral pharyngitis    Filed Vitals:   07/18/14 2119  BP: 121/79  Pulse: 88  Temp: 98.2 F (36.8 C)  Resp: 18   Afebrile, NAD, non-toxic appearing, AAOx4.  Pt afebrile without tonsillar exudate, negative strep. Presents with mild cervical lymphadenopathy, & dysphagia; diagnosis of viral pharyngitis. No abx indicated. DC w symptomatic tx for pain  Pt does not appear dehydrated, but did discuss importance of water rehydration. Presentation non concerning for PTA or infxn spread to soft tissue. No trismus or uvula deviation. Specific return precautions discussed. Pt able to drink water in ED without difficulty with intact air way. Recommended PCP follow up.   I personally performed the services  described in this documentation, which was scribed in my presence. The recorded information has been reviewed and is accurate.     Harlow Mares, PA-C 07/18/14 2259  Malvin Johns, MD 07/18/14 [phone]

## 2014-07-20 LAB — CULTURE, GROUP A STREP

## 2014-08-29 ENCOUNTER — Encounter (HOSPITAL_COMMUNITY): Payer: Self-pay | Admitting: Emergency Medicine

## 2014-08-29 ENCOUNTER — Emergency Department (HOSPITAL_COMMUNITY)
Admission: EM | Admit: 2014-08-29 | Discharge: 2014-08-29 | Disposition: A | Discharge status: Home / Self Care | Payer: Medicaid Other | Attending: Emergency Medicine | Admitting: Emergency Medicine

## 2014-08-29 DIAGNOSIS — M1990 Unspecified osteoarthritis, unspecified site: Secondary | ICD-10-CM | POA: Diagnosis not present

## 2014-08-29 DIAGNOSIS — J0100 Acute maxillary sinusitis, unspecified: Secondary | ICD-10-CM | POA: Insufficient documentation

## 2014-08-29 DIAGNOSIS — Z791 Long term (current) use of non-steroidal anti-inflammatories (NSAID): Secondary | ICD-10-CM | POA: Insufficient documentation

## 2014-08-29 DIAGNOSIS — R51 Headache: Secondary | ICD-10-CM | POA: Diagnosis present

## 2014-08-29 DIAGNOSIS — Z720 Tobacco use: Secondary | ICD-10-CM | POA: Diagnosis not present

## 2014-08-29 DIAGNOSIS — Z8619 Personal history of other infectious and parasitic diseases: Secondary | ICD-10-CM | POA: Insufficient documentation

## 2014-08-29 DIAGNOSIS — H9209 Otalgia, unspecified ear: Secondary | ICD-10-CM | POA: Insufficient documentation

## 2014-08-29 DIAGNOSIS — Z8679 Personal history of other diseases of the circulatory system: Secondary | ICD-10-CM | POA: Insufficient documentation

## 2014-08-29 MED ORDER — NAPROXEN 500 MG PO TABS: 500.0000 mg | ORAL_TABLET | Freq: Two times a day (BID) | ORAL | Status: DC

## 2014-08-29 MED ORDER — IBUPROFEN 800 MG PO TABS
800.0000 mg | ORAL_TABLET | Freq: Once | ORAL | Status: AC
  Administered 2014-08-29: 800 mg via ORAL
  Filled 2014-08-29: qty 1

## 2014-08-29 MED ORDER — OXYCODONE-ACETAMINOPHEN 5-325 MG PO TABS
1.0000 | ORAL_TABLET | Freq: Once | ORAL | Status: AC
  Administered 2014-08-29: 1 via ORAL

## 2014-08-29 MED ORDER — AMOXICILLIN 500 MG PO CAPS
500.0000 mg | ORAL_CAPSULE | Freq: Once | ORAL | Status: AC
  Administered 2014-08-29: 500 mg via ORAL
  Filled 2014-08-29: qty 1

## 2014-08-29 MED ORDER — OXYCODONE-ACETAMINOPHEN 5-325 MG PO TABS: 2.0000 | ORAL_TABLET | ORAL | Status: DC | PRN

## 2014-08-29 MED ORDER — OXYCODONE-ACETAMINOPHEN 5-325 MG PO TABS
1.0000 | ORAL_TABLET | Freq: Once | ORAL | Status: AC
  Administered 2014-08-29: 1 via ORAL
  Filled 2014-08-29: qty 1

## 2014-08-29 MED ORDER — FLUTICASONE PROPIONATE 50 MCG/ACT NA SUSP: NASAL | Status: DC

## 2014-08-29 MED ORDER — OXYCODONE-ACETAMINOPHEN 5-325 MG PO TABS
ORAL_TABLET | ORAL | Status: AC
  Filled 2014-08-29: qty 1

## 2014-08-29 MED ORDER — AMOXICILLIN 500 MG PO CAPS: 500.0000 mg | ORAL_CAPSULE | Freq: Three times a day (TID) | ORAL | Status: AC

## 2014-08-29 NOTE — ED Notes (Signed)
Pt reports headache in sinus area, also stiff neck and left ear pain. Pt tearful in triage.

## 2014-08-29 NOTE — ED Provider Notes (Signed)
CSN: [id number]     Arrival date & time 08/29/14  1834 History   First MD Initiated Contact with Patient 08/29/14 2020     Chief Complaint  Patient presents with  . Headache  . Facial Pain      HPI  Planes of headache and facial pain for the last 2 days. Has a history of migraine headaches. However she states this feels different. Her pain has been on and respiratory nose and to the left her nose over left maxilla and left frontal region.  Is not stiff in her neck. Has some "lump" in her neck. Has palpable lymphadenopathy anterior and posterior cervical lymph node chains.  Past Medical History  Diagnosis Date  . No pertinent past medical history   . Trichomonas   . Arthritis   . Headache    Past Surgical History  Procedure Laterality Date  . Dilation and curettage of uterus     Family History  Problem Relation Age of Onset  . Migraines Father 46  . Hypertension Brother   . Hypertension Maternal Aunt   . Hypertension Maternal Uncle   . Hypertension Maternal Grandmother    History  Substance Use Topics  . Smoking status: Current Every Day Smoker -- 1.00 packs/day for 3 years    Types: Cigarettes  . Smokeless tobacco: Never Used  . Alcohol Use: No   OB History    Gravida Para Term Preterm AB TAB SAB Ectopic Multiple Living   5 3 3  2 2    3      Review of Systems  Constitutional: Negative for fever, chills, diaphoresis, appetite change and fatigue.  HENT: Positive for congestion, ear pain, sinus pressure and sore throat. Negative for mouth sores and trouble swallowing.   Eyes: Negative for visual disturbance.  Respiratory: Negative for cough, chest tightness, shortness of breath and wheezing.   Cardiovascular: Negative for chest pain.  Gastrointestinal: Negative for nausea, vomiting, abdominal pain, diarrhea and abdominal distention.  Endocrine: Negative for polydipsia, polyphagia and polyuria.  Genitourinary: Negative for dysuria, frequency and hematuria.    Musculoskeletal: Negative for gait problem.  Skin: Negative for color change, pallor and rash.  Neurological: Negative for dizziness, syncope, light-headedness and headaches.  Hematological: Does not bruise/bleed easily.  Psychiatric/Behavioral: Negative for behavioral problems and confusion.      Allergies  Review of patient's allergies indicates no known allergies.  Home Medications   Prior to Admission medications   Medication Sig Start Date End Date Taking? Authorizing Provider  ibuprofen (ADVIL,MOTRIN) 200 MG tablet Take 400 mg by mouth every 6 (six) hours as needed (pain).   Yes Historical Provider, MD  naproxen sodium (ANAPROX) 220 MG tablet Take 220 mg by mouth 2 (two) times daily as needed (pain). Aleve   Yes Historical Provider, MD  amoxicillin (AMOXIL) 500 MG capsule Take 1 capsule (500 mg total) by mouth 3 (three) times daily. 08/29/14 09/08/14  Tanna Furry, MD  fluticasone [facility]) 50 MCG/ACT nasal spray 1 spray each nares bid 08/29/14   Tanna Furry, MD  HYDROcodone-acetaminophen [facility]) 5-325 MG per tablet Take 1-2 tablets by mouth every 6 (six) hours as needed for severe pain. Patient not taking: Reported on 07/18/2014 05/03/14   Mercedes Camprubi-Soms, PA-C  ibuprofen (ADVIL,MOTRIN) 800 MG tablet Take 1 tablet (800 mg total) by mouth 3 (three) times daily. Patient not taking: Reported on 08/29/2014 07/18/14   Baron Sane, PA-C  lidocaine (XYLOCAINE) 2 % solution Use as directed 20 mLs in the mouth or  throat as needed for mouth pain. Patient not taking: Reported on 08/29/2014 07/18/14   Baron Sane, PA-C  naproxen (NAPROSYN) 500 MG tablet Take 1 tablet (500 mg total) by mouth 2 (two) times daily. 08/29/14   Tanna Furry, MD  oxyCODONE-acetaminophen (PERCOCET/ROXICET) 5-325 MG per tablet Take 2 tablets by mouth every 4 (four) hours as needed. 08/29/14   Tanna Furry, MD   BP 127/86 mmHg  Pulse 101  Temp(Src) 98.1 F (36.7 C) (Oral)  Resp 16  Ht 5' (1.524 m)  Wt 120  lb (54.432 kg)  BMI 23.44 kg/m2  SpO2 100%  LMP 07/21/2014 (Approximate) Physical Exam  Constitutional: She is oriented to person, place, and time. She appears well-developed and well-nourished. No distress.  HENT:  Head: Normocephalic.    Right Ear: No middle ear effusion.  Left Ear:  No middle ear effusion.  Nose: Mucosal edema and rhinorrhea present.  Eyes: Conjunctivae are normal. Pupils are equal, round, and reactive to light. No scleral icterus.  Neck: Normal range of motion. Neck supple. No thyromegaly present.    Cardiovascular: Normal rate and regular rhythm.  Exam reveals no gallop and no friction rub.   No murmur heard. Pulmonary/Chest: Effort normal and breath sounds normal. No respiratory distress. She has no wheezes. She has no rales.  Abdominal: Soft. Bowel sounds are normal. She exhibits no distension. There is no tenderness. There is no rebound.  Musculoskeletal: Normal range of motion.  Neurological: She is alert and oriented to person, place, and time.  Skin: Skin is warm and dry. No rash noted.  Psychiatric: She has a normal mood and affect. Her behavior is normal.    ED Course  Procedures (including critical care time) Labs Review Labs Reviewed - No data to display  Imaging Review No results found.   EKG Interpretation None      MDM   Final diagnoses:  Acute maxillary sinusitis, recurrence not specified    Patient was supple neck. Afebrile. Nontoxic. Complains of painful nose, left cheek left fourth head. Symptoms consistent with sinusitis. Congestion in the nose on exam. Normal-appearing ears. Normal pharynx. Plan is Flonase, amoxicillin, anti-inflammatory, pain medication. Primary care follow-up.    Tanna Furry, MD 08/29/14 2132

## 2014-08-29 NOTE — Discharge Instructions (Signed)
Sinusitis °Sinusitis is redness, soreness, and puffiness (inflammation) of the air pockets in the bones of your face (sinuses). The redness, soreness, and puffiness can cause air and mucus to get trapped in your sinuses. This can allow germs to grow and cause an infection.  °HOME CARE  °· Drink enough fluids to keep your pee (urine) clear or pale yellow. °· Use a humidifier in your home. °· Run a hot shower to create steam in the bathroom. Sit in the bathroom with the door closed. Breathe in the steam 3-4 times a day. °· Put a warm, moist washcloth on your face 3-4 times a day, or as told by your doctor. °· Use salt water sprays (saline sprays) to wet the thick fluid in your nose. This can help the sinuses drain. °· Only take medicine as told by your doctor. °GET HELP RIGHT AWAY IF:  °· Your pain gets worse. °· You have very bad headaches. °· You are sick to your stomach (nauseous). °· You throw up (vomit). °· You are very sleepy (drowsy) all the time. °· Your face is puffy (swollen). °· Your vision changes. °· You have a stiff neck. °· You have trouble breathing. °MAKE SURE YOU:  °· Understand these instructions. °· Will watch your condition. °· Will get help right away if you are not doing well or get worse. °Document Released: 11/14/2007 Document Revised: 02/20/2012 Document Reviewed: 01/01/2012 °[employer]® Patient Information ©2015 [employer]. This information is not intended to replace advice given to you by your health care provider. Make sure you discuss any questions you have with your health care provider. ° °

## 2014-09-28 ENCOUNTER — Encounter (HOSPITAL_COMMUNITY): Payer: Self-pay

## 2014-09-28 ENCOUNTER — Emergency Department (HOSPITAL_COMMUNITY)
Admission: EM | Admit: 2014-09-28 | Discharge: 2014-09-28 | Discharge status: Against Medical Advice | Payer: Medicaid Other | Attending: Emergency Medicine | Admitting: Emergency Medicine

## 2014-09-28 DIAGNOSIS — G43909 Migraine, unspecified, not intractable, without status migrainosus: Secondary | ICD-10-CM | POA: Diagnosis present

## 2014-09-28 DIAGNOSIS — Z720 Tobacco use: Secondary | ICD-10-CM | POA: Insufficient documentation

## 2014-09-28 NOTE — ED Notes (Signed)
No answer from pt when called from lobby x one.

## 2014-09-28 NOTE — ED Notes (Signed)
No answer from pt when called from lobby x 3.

## 2014-09-28 NOTE — ED Notes (Signed)
Pt presents with c/o migraine. Pt reports the headache started this morning. Pt reports she has a future appointment with the neurologist. Pt reports she has taken some Aleve and BC powder with no relief.

## 2014-09-28 NOTE — ED Notes (Addendum)
No answer from pt when called from lobby x two.

## 2014-10-06 ENCOUNTER — Ambulatory Visit: Payer: Medicaid Other | Admitting: Diagnostic Neuroimaging

## 2014-10-07 ENCOUNTER — Encounter: Payer: Self-pay | Admitting: Diagnostic Neuroimaging

## 2014-12-08 ENCOUNTER — Encounter (HOSPITAL_COMMUNITY): Payer: Self-pay | Admitting: Family Medicine

## 2014-12-08 ENCOUNTER — Emergency Department (HOSPITAL_COMMUNITY)
Admission: EM | Admit: 2014-12-08 | Discharge: 2014-12-08 | Discharge status: Against Medical Advice | Payer: Medicaid Other | Attending: Emergency Medicine | Admitting: Emergency Medicine

## 2014-12-08 DIAGNOSIS — R51 Headache: Secondary | ICD-10-CM | POA: Diagnosis present

## 2014-12-08 DIAGNOSIS — Z720 Tobacco use: Secondary | ICD-10-CM | POA: Insufficient documentation

## 2014-12-08 NOTE — ED Notes (Signed)
Pt here for headache intermittent al day. sts out of her medication.

## 2014-12-08 NOTE — ED Notes (Signed)
Patient called in awaiting room and directly outside ER. Patient not in bathrooms, called x2

## 2014-12-28 ENCOUNTER — Encounter (HOSPITAL_COMMUNITY): Payer: Self-pay | Admitting: Emergency Medicine

## 2014-12-28 ENCOUNTER — Emergency Department (HOSPITAL_COMMUNITY)
Admission: EM | Admit: 2014-12-28 | Discharge: 2014-12-28 | Disposition: A | Discharge status: Home / Self Care | Payer: Medicaid Other | Attending: Emergency Medicine | Admitting: Emergency Medicine

## 2014-12-28 DIAGNOSIS — Z720 Tobacco use: Secondary | ICD-10-CM | POA: Diagnosis not present

## 2014-12-28 DIAGNOSIS — K029 Dental caries, unspecified: Secondary | ICD-10-CM | POA: Diagnosis not present

## 2014-12-28 DIAGNOSIS — Z791 Long term (current) use of non-steroidal anti-inflammatories (NSAID): Secondary | ICD-10-CM | POA: Insufficient documentation

## 2014-12-28 DIAGNOSIS — R51 Headache: Secondary | ICD-10-CM | POA: Diagnosis not present

## 2014-12-28 DIAGNOSIS — R590 Localized enlarged lymph nodes: Secondary | ICD-10-CM | POA: Diagnosis not present

## 2014-12-28 DIAGNOSIS — K088 Other specified disorders of teeth and supporting structures: Secondary | ICD-10-CM | POA: Insufficient documentation

## 2014-12-28 DIAGNOSIS — M1990 Unspecified osteoarthritis, unspecified site: Secondary | ICD-10-CM | POA: Insufficient documentation

## 2014-12-28 DIAGNOSIS — Z8619 Personal history of other infectious and parasitic diseases: Secondary | ICD-10-CM | POA: Insufficient documentation

## 2014-12-28 DIAGNOSIS — Z7951 Long term (current) use of inhaled steroids: Secondary | ICD-10-CM | POA: Diagnosis not present

## 2014-12-28 MED ORDER — PENICILLIN V POTASSIUM 500 MG PO TABS: 500.0000 mg | ORAL_TABLET | Freq: Three times a day (TID) | ORAL | Status: DC

## 2014-12-28 MED ORDER — NAPROXEN 500 MG PO TABS: 500.0000 mg | ORAL_TABLET | Freq: Two times a day (BID) | ORAL | Status: DC

## 2014-12-28 MED ORDER — IBUPROFEN 400 MG PO TABS
400.0000 mg | ORAL_TABLET | Freq: Once | ORAL | Status: AC
  Administered 2014-12-28: 400 mg via ORAL
  Filled 2014-12-28: qty 1

## 2014-12-28 NOTE — ED Provider Notes (Signed)
CSN: [id number]     Arrival date & time 12/28/14  1258 History  This chart was scribed for Domenic Moras, PA-C, working with Pamella Pert, MD by Julien Nordmann, ED Scribe. This patient was seen in room TR04C/TR04C and the patient's care was started at 1:53 PM.     Chief Complaint  Patient presents with  . Dental Pain     The history is provided by the patient. No language interpreter was used.   HPI Comments: Ashley Spencer is a 29 y.o. female who presents to the Emergency Department complaining of intermittent, gradual worsening upper left side dental pain onset 8 months ago. Pt reports breaking her 2nd pre molar 8 months ago and not following up with dentist since it occurred. She notes her pain was 6/10 last night and has a current pain of 9/10.She reports taking OTC tylenol occasionally to alleviate the pain. The pain radiates to the left side of her head and reports eating makes increases her pain. Pt also notes having a bump on the back of her head that alarmed her. Pt denies nausea, vomiting, fever, sore throat, ear drainage, difficulty hearing.   Past Medical History  Diagnosis Date  . No pertinent past medical history   . Trichomonas   . Arthritis   . Headache    Past Surgical History  Procedure Laterality Date  . Dilation and curettage of uterus     Family History  Problem Relation Age of Onset  . Migraines Father 33  . Hypertension Brother   . Hypertension Maternal Aunt   . Hypertension Maternal Uncle   . Hypertension Maternal Grandmother    History  Substance Use Topics  . Smoking status: Current Every Day Smoker -- 1.00 packs/day for 3 years    Types: Cigarettes  . Smokeless tobacco: Never Used  . Alcohol Use: No   OB History    Gravida Para Term Preterm AB TAB SAB Ectopic Multiple Living   5 3 3  2 2    3      Review of Systems  Constitutional: Negative for fever.  HENT: Positive for dental problem. Negative for sore throat.   Gastrointestinal: Negative  for nausea and vomiting.  Neurological: Positive for headaches.      Allergies  Review of patient's allergies indicates no known allergies.  Home Medications   Prior to Admission medications   Medication Sig Start Date End Date Taking? Authorizing Provider  fluticasone Asencion Islam) 50 MCG/ACT nasal spray 1 spray each nares bid 08/29/14   Tanna Furry, MD  HYDROcodone-acetaminophen [facility]) 5-325 MG per tablet Take 1-2 tablets by mouth every 6 (six) hours as needed for severe pain. Patient not taking: Reported on 07/18/2014 05/03/14   Mercedes Camprubi-Soms, PA-C  ibuprofen (ADVIL,MOTRIN) 200 MG tablet Take 400 mg by mouth every 6 (six) hours as needed (pain).    Historical Provider, MD  ibuprofen (ADVIL,MOTRIN) 800 MG tablet Take 1 tablet (800 mg total) by mouth 3 (three) times daily. Patient not taking: Reported on 08/29/2014 07/18/14   Baron Sane, PA-C  lidocaine (XYLOCAINE) 2 % solution Use as directed 20 mLs in the mouth or throat as needed for mouth pain. Patient not taking: Reported on 08/29/2014 07/18/14   Baron Sane, PA-C  naproxen (NAPROSYN) 500 MG tablet Take 1 tablet (500 mg total) by mouth 2 (two) times daily. 08/29/14   Tanna Furry, MD  naproxen sodium (ANAPROX) 220 MG tablet Take 220 mg by mouth 2 (two) times daily as needed (pain). Aleve  Historical Provider, MD  oxyCODONE-acetaminophen (PERCOCET/ROXICET) 5-325 MG per tablet Take 2 tablets by mouth every 4 (four) hours as needed. 08/29/14   Tanna Furry, MD   Triage vitals: BP 122/86 mmHg  Pulse 94  Temp(Src) 97.9 F (36.6 C)  Resp 18  Ht 5\' 4"  (1.626 m)  Wt 120 lb (54.432 kg)  BMI 20.59 kg/m2  SpO2 100%  LMP 12/01/2014 Physical Exam  Constitutional: She appears well-developed and well-nourished. No distress.  HENT:  Head: Normocephalic and atraumatic.  Tooth 13 is mostly with old dental fracture with only a small fragment retained with tenderness to gumline with no trismus, lymphadenopahty noted to left  posterior cervical chain   Eyes: Conjunctivae are normal. Pupils are equal, round, and reactive to light. Right eye exhibits no discharge. Left eye exhibits no discharge.  Neck: Neck supple.  Cardiovascular: Normal rate, regular rhythm, normal heart sounds and intact distal pulses.   Pulmonary/Chest: Effort normal and breath sounds normal. No respiratory distress.  Abdominal: Soft. There is no tenderness.  Lymphadenopathy:    She has no cervical adenopathy.  Neurological: She is alert. Coordination normal.  Skin: Skin is warm and dry. No rash noted. She is not diaphoretic.  Psychiatric: She has a normal mood and affect. Her behavior is normal.  Nursing note and vitals reviewed.   ED Course  Procedures  DIAGNOSTIC STUDIES: Oxygen Saturation is 100% on RA, normal by my interpretation.  COORDINATION OF CARE:  1:56 PM Discussed treatment plan which includes veetid, naproxen, ibuprofen for headache, and follow up with dentist referral with pt at bedside and pt agreed to plan.  Labs Review Labs Reviewed - No data to display  Imaging Review No results found.   EKG Interpretation None      MDM   Final diagnoses:  Pain due to dental caries    BP 122/86 mmHg  Pulse 94  Temp(Src) 97.9 F (36.6 C)  Resp 18  Ht 5\' 4"  (1.626 m)  Wt 120 lb (54.432 kg)  BMI 20.59 kg/m2  SpO2 100%  LMP 12/01/2014  I personally performed the services described in this documentation, which was scribed in my presence. The recorded information has been reviewed and is accurate.    Domenic Moras, PA-C 12/28/14 [place], MD 12/29/14 [phone]

## 2014-12-28 NOTE — Discharge Instructions (Signed)

## 2014-12-28 NOTE — ED Notes (Signed)
PT reports broken tooth in upper left side. Pt states tooth has been broken for approximately 8 months but for the last week has became very tender and swollen.

## 2015-01-31 ENCOUNTER — Encounter (HOSPITAL_COMMUNITY): Payer: Self-pay | Admitting: Emergency Medicine

## 2015-01-31 ENCOUNTER — Emergency Department (HOSPITAL_COMMUNITY): Payer: Medicaid Other

## 2015-01-31 ENCOUNTER — Emergency Department (HOSPITAL_COMMUNITY)
Admission: EM | Admit: 2015-01-31 | Discharge: 2015-01-31 | Disposition: A | Discharge status: Home / Self Care | Payer: Medicaid Other | Attending: Physician Assistant | Admitting: Physician Assistant

## 2015-01-31 ENCOUNTER — Emergency Department (HOSPITAL_COMMUNITY)
Admission: EM | Admit: 2015-01-31 | Discharge: 2015-01-31 | Discharge status: Against Medical Advice | Payer: Medicaid Other | Attending: Emergency Medicine | Admitting: Emergency Medicine

## 2015-01-31 DIAGNOSIS — Y998 Other external cause status: Secondary | ICD-10-CM | POA: Diagnosis not present

## 2015-01-31 DIAGNOSIS — Z791 Long term (current) use of non-steroidal anti-inflammatories (NSAID): Secondary | ICD-10-CM | POA: Diagnosis not present

## 2015-01-31 DIAGNOSIS — R200 Anesthesia of skin: Secondary | ICD-10-CM | POA: Diagnosis present

## 2015-01-31 DIAGNOSIS — S4991XA Unspecified injury of right shoulder and upper arm, initial encounter: Secondary | ICD-10-CM | POA: Diagnosis not present

## 2015-01-31 DIAGNOSIS — Z8619 Personal history of other infectious and parasitic diseases: Secondary | ICD-10-CM | POA: Insufficient documentation

## 2015-01-31 DIAGNOSIS — Y9389 Activity, other specified: Secondary | ICD-10-CM | POA: Diagnosis not present

## 2015-01-31 DIAGNOSIS — X58XXXA Exposure to other specified factors, initial encounter: Secondary | ICD-10-CM | POA: Diagnosis not present

## 2015-01-31 DIAGNOSIS — M1990 Unspecified osteoarthritis, unspecified site: Secondary | ICD-10-CM | POA: Diagnosis not present

## 2015-01-31 DIAGNOSIS — Z720 Tobacco use: Secondary | ICD-10-CM | POA: Insufficient documentation

## 2015-01-31 DIAGNOSIS — Y9289 Other specified places as the place of occurrence of the external cause: Secondary | ICD-10-CM | POA: Insufficient documentation

## 2015-01-31 DIAGNOSIS — M25511 Pain in right shoulder: Secondary | ICD-10-CM

## 2015-01-31 DIAGNOSIS — Z79899 Other long term (current) drug therapy: Secondary | ICD-10-CM | POA: Diagnosis not present

## 2015-01-31 MED ORDER — NAPROXEN 500 MG PO TABS: 500.0000 mg | ORAL_TABLET | Freq: Two times a day (BID) | ORAL | Status: DC

## 2015-01-31 MED ORDER — IBUPROFEN 800 MG PO TABS
800.0000 mg | ORAL_TABLET | Freq: Once | ORAL | Status: AC
  Administered 2015-01-31: 800 mg via ORAL
  Filled 2015-01-31: qty 1

## 2015-01-31 NOTE — Progress Notes (Signed)
[facility] is [facility] [place] [phone]

## 2015-01-31 NOTE — Discharge Instructions (Signed)
Take the prescribed medication as directed. Follow-up with dr. Ninfa Linden-- call to make appt. Return to the ED for new or worsening symptoms.
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
## 2015-01-31 NOTE — ED Notes (Signed)
Pt. Called x3 no answer
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
## 2015-01-31 NOTE — ED Notes (Signed)
Ice offered/declined "i'm cold"

## 2015-02-02 ENCOUNTER — Emergency Department (HOSPITAL_COMMUNITY)
Admission: EM | Admit: 2015-02-02 | Discharge: 2015-02-02 | Discharge status: Against Medical Advice | Payer: Medicaid Other | Attending: Emergency Medicine | Admitting: Emergency Medicine

## 2015-02-02 NOTE — ED Notes (Signed)
Pt called x3 times, no answer.

## 2015-04-26 ENCOUNTER — Emergency Department (HOSPITAL_COMMUNITY): Payer: Medicaid Other

## 2015-04-26 ENCOUNTER — Emergency Department (HOSPITAL_COMMUNITY)
Admission: EM | Admit: 2015-04-26 | Discharge: 2015-04-26 | Disposition: A | Discharge status: Home / Self Care | Payer: Medicaid Other | Attending: Emergency Medicine | Admitting: Emergency Medicine

## 2015-04-26 ENCOUNTER — Encounter (HOSPITAL_COMMUNITY): Payer: Self-pay | Admitting: Emergency Medicine

## 2015-04-26 DIAGNOSIS — Z8619 Personal history of other infectious and parasitic diseases: Secondary | ICD-10-CM | POA: Insufficient documentation

## 2015-04-26 DIAGNOSIS — Z7951 Long term (current) use of inhaled steroids: Secondary | ICD-10-CM | POA: Insufficient documentation

## 2015-04-26 DIAGNOSIS — M25511 Pain in right shoulder: Secondary | ICD-10-CM | POA: Diagnosis present

## 2015-04-26 DIAGNOSIS — M75101 Unspecified rotator cuff tear or rupture of right shoulder, not specified as traumatic: Secondary | ICD-10-CM | POA: Diagnosis not present

## 2015-04-26 DIAGNOSIS — F17210 Nicotine dependence, cigarettes, uncomplicated: Secondary | ICD-10-CM | POA: Diagnosis not present

## 2015-04-26 DIAGNOSIS — Z791 Long term (current) use of non-steroidal anti-inflammatories (NSAID): Secondary | ICD-10-CM | POA: Diagnosis not present

## 2015-04-26 DIAGNOSIS — M1990 Unspecified osteoarthritis, unspecified site: Secondary | ICD-10-CM | POA: Insufficient documentation

## 2015-04-26 DIAGNOSIS — Z792 Long term (current) use of antibiotics: Secondary | ICD-10-CM | POA: Insufficient documentation

## 2015-04-26 MED ORDER — PREDNISONE 50 MG PO TABS: 50.0000 mg | ORAL_TABLET | Freq: Every day | ORAL | Status: DC

## 2015-04-26 MED ORDER — HYDROCODONE-ACETAMINOPHEN 5-325 MG PO TABS
1.0000 | ORAL_TABLET | Freq: Once | ORAL | Status: AC
  Administered 2015-04-26: 1 via ORAL
  Filled 2015-04-26: qty 1

## 2015-04-26 MED ORDER — HYDROCODONE-ACETAMINOPHEN 5-325 MG PO TABS
1.0000 | ORAL_TABLET | Freq: Four times a day (QID) | ORAL | Status: DC | PRN
Start: 2015-04-26 — End: 2016-03-27

## 2015-04-26 NOTE — Discharge Instructions (Signed)
Follow-up with the orthopedist provided.  Use ice and heat on your shoulder.  Use the sling for comfort.  He can also use it at night while sleeping
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
## 2015-08-11 ENCOUNTER — Encounter (HOSPITAL_COMMUNITY): Payer: Self-pay | Admitting: Nurse Practitioner

## 2015-08-11 ENCOUNTER — Emergency Department (HOSPITAL_COMMUNITY)
Admission: EM | Admit: 2015-08-11 | Discharge: 2015-08-11 | Disposition: A | Discharge status: Home / Self Care | Payer: Medicaid Other | Attending: Emergency Medicine | Admitting: Emergency Medicine

## 2015-08-11 DIAGNOSIS — J029 Acute pharyngitis, unspecified: Secondary | ICD-10-CM

## 2015-08-11 DIAGNOSIS — Z7951 Long term (current) use of inhaled steroids: Secondary | ICD-10-CM | POA: Diagnosis not present

## 2015-08-11 DIAGNOSIS — J069 Acute upper respiratory infection, unspecified: Secondary | ICD-10-CM | POA: Diagnosis not present

## 2015-08-11 DIAGNOSIS — Z7952 Long term (current) use of systemic steroids: Secondary | ICD-10-CM | POA: Insufficient documentation

## 2015-08-11 DIAGNOSIS — F17210 Nicotine dependence, cigarettes, uncomplicated: Secondary | ICD-10-CM | POA: Diagnosis not present

## 2015-08-11 DIAGNOSIS — M1990 Unspecified osteoarthritis, unspecified site: Secondary | ICD-10-CM | POA: Diagnosis not present

## 2015-08-11 DIAGNOSIS — Z8679 Personal history of other diseases of the circulatory system: Secondary | ICD-10-CM | POA: Diagnosis not present

## 2015-08-11 DIAGNOSIS — Z792 Long term (current) use of antibiotics: Secondary | ICD-10-CM | POA: Diagnosis not present

## 2015-08-11 DIAGNOSIS — Z8619 Personal history of other infectious and parasitic diseases: Secondary | ICD-10-CM | POA: Diagnosis not present

## 2015-08-11 DIAGNOSIS — Z791 Long term (current) use of non-steroidal anti-inflammatories (NSAID): Secondary | ICD-10-CM | POA: Diagnosis not present

## 2015-08-11 HISTORY — DX: Migraine, unspecified, not intractable, without status migrainosus: G43.909

## 2015-08-11 LAB — RAPID STREP SCREEN (MED CTR MEBANE ONLY): Streptococcus, Group A Screen (Direct): NEGATIVE

## 2015-08-11 MED ORDER — IBUPROFEN 800 MG PO TABS: 800.0000 mg | ORAL_TABLET | Freq: Three times a day (TID) | ORAL | Status: DC

## 2015-08-11 MED ORDER — KETOROLAC TROMETHAMINE 60 MG/2ML IM SOLN
60.0000 mg | Freq: Once | INTRAMUSCULAR | Status: AC
  Administered 2015-08-11: 60 mg via INTRAMUSCULAR
  Filled 2015-08-11: qty 2

## 2015-08-11 MED ORDER — DEXAMETHASONE SODIUM PHOSPHATE 10 MG/ML IJ SOLN
6.0000 mg | Freq: Once | INTRAMUSCULAR | Status: AC
  Administered 2015-08-11: 6 mg via INTRAMUSCULAR
  Filled 2015-08-11: qty 1

## 2015-08-11 MED ORDER — LIDOCAINE VISCOUS 2 % MT SOLN: 10.0000 mL | Freq: Four times a day (QID) | OROMUCOSAL | Status: DC | PRN

## 2015-08-11 MED ORDER — GUAIFENESIN-CODEINE 100-10 MG/5ML PO SOLN: 5.0000 mL | Freq: Three times a day (TID) | ORAL | Status: DC | PRN

## 2015-08-11 NOTE — ED Provider Notes (Signed)
CSN: [id number]     Arrival date & time 08/11/15  1109 History  By signing my name below, I, Ashley Spencer, attest that this documentation has been prepared under the direction and in the presence of non-physician practitioner, Delsa Grana, PA-C. Electronically Signed: Evelene Spencer, Scribe. 08/11/2015. 1:49 PM.    Chief Complaint  Patient presents with  . Sore Throat   Patient is a 30 y.o. female presenting with pharyngitis. The history is provided by the patient. No language interpreter was used.  Sore Throat This is a new problem. The current episode started yesterday. The problem occurs constantly. The problem has been gradually worsening. Associated symptoms include congestion, coughing, a fever, headaches and a sore throat. Pertinent negatives include no abdominal pain, anorexia, arthralgias, change in bowel habit, chest pain, chills, diaphoresis, fatigue, joint swelling, myalgias, nausea, neck pain, numbness, rash, swollen glands, urinary symptoms, vomiting or weakness. The symptoms are aggravated by eating, drinking and coughing. She has tried acetaminophen for the symptoms. The treatment provided no relief.     HPI Comments:  Ashley Spencer is a 30 y.o. female who presents to the Emergency Department complaining of a sore throat x 2 days with 10/10 pain. Her pain is exacerbated when swallowing but pt is able to is tolerate her secretions. She reports associated HA, productive cough, nasal congestion, and fever with TMAX of 102. She has taken tylenol without relief. She denies sick contacts, SOB, wheezing, rash, nausea, and vomiting. No alleviating factors noted.  No CP, abdominal pain.   Past Medical History  Diagnosis Date  . No pertinent past medical history   . Trichomonas   . Arthritis   . Headache   . Migraine    Past Surgical History  Procedure Laterality Date  . Dilation and curettage of uterus     Family History  Problem Relation Age of Onset  . Migraines Father 84  .  Hypertension Brother   . Hypertension Maternal Aunt   . Hypertension Maternal Uncle   . Hypertension Maternal Grandmother    Social History  Substance Use Topics  . Smoking status: Current Some Day Smoker -- 1.00 packs/day for 3 years    Types: Cigarettes  . Smokeless tobacco: Never Used  . Alcohol Use: No   OB History    Gravida Para Term Preterm AB TAB SAB Ectopic Multiple Living   5 3 3  2 2    3      Review of Systems  Constitutional: Positive for fever. Negative for chills, diaphoresis and fatigue.  HENT: Positive for congestion and sore throat. Negative for trouble swallowing.   Respiratory: Positive for cough. Negative for shortness of breath.   Cardiovascular: Negative for chest pain.  Gastrointestinal: Negative for nausea, vomiting, abdominal pain, anorexia and change in bowel habit.  Musculoskeletal: Negative for myalgias, joint swelling, arthralgias and neck pain.  Skin: Negative for rash.  Neurological: Positive for headaches. Negative for weakness and numbness.  All other systems reviewed and are negative.   Allergies  Review of patient's allergies indicates no known allergies.  Home Medications   Prior to Admission medications   Medication Sig Start Date End Date Taking? Authorizing Provider  fluticasone (FLONASE) 50 MCG/ACT nasal spray 1 spray each nares bid 08/29/14   Tanna Furry, MD  guaiFENesin-codeine 100-10 MG/5ML syrup Take 5 mLs by mouth 3 (three) times daily as needed for cough. 08/11/15   Delsa Grana, PA-C  HYDROcodone-acetaminophen (NORCO/VICODIN) 5-325 MG tablet Take 1 tablet by mouth every 6 (  six) hours as needed for moderate pain. 04/26/15   Dalia Heading, PA-C  ibuprofen (ADVIL,MOTRIN) 800 MG tablet Take 1 tablet (800 mg total) by mouth 3 (three) times daily. 08/11/15   Delsa Grana, PA-C  lidocaine (XYLOCAINE) 2 % solution Use as directed 10 mLs in the mouth or throat every 6 (six) hours as needed for mouth pain. 08/11/15   Delsa Grana, PA-C  naproxen  (NAPROSYN) 500 MG tablet Take 1 tablet (500 mg total) by mouth 2 (two) times daily with a meal. 01/31/15   Larene Pickett, PA-C  naproxen sodium (ANAPROX) 220 MG tablet Take 220 mg by mouth 2 (two) times daily as needed (pain). Aleve    Historical Provider, MD  oxyCODONE-acetaminophen (PERCOCET/ROXICET) 5-325 MG per tablet Take 2 tablets by mouth every 4 (four) hours as needed. 08/29/14   Tanna Furry, MD  penicillin v potassium (VEETID) 500 MG tablet Take 1 tablet (500 mg total) by mouth 3 (three) times daily. 12/28/14   Domenic Moras, PA-C  predniSONE (DELTASONE) 50 MG tablet Take 1 tablet (50 mg total) by mouth daily with breakfast. 04/26/15   Dalia Heading, PA-C   BP 103/64 mmHg  Pulse 80  Temp(Src) 98.1 F (36.7 C) (Oral)  Resp 16  SpO2 100% Physical Exam  Constitutional: She is oriented to person, place, and time. She appears well-developed and well-nourished. No distress.  HENT:  Head: Normocephalic and atraumatic.  Right Ear: External ear normal.  Left Ear: External ear normal.  Nose: Nose normal.  Mouth/Throat: Uvula is midline and mucous membranes are normal. No uvula swelling. Oropharyngeal exudate, posterior oropharyngeal edema and posterior oropharyngeal erythema present.  Eyes: Conjunctivae and EOM are normal. Pupils are equal, round, and reactive to light. Right eye exhibits no discharge. Left eye exhibits no discharge. No scleral icterus.  Neck: Normal range of motion. Neck supple. No JVD present. No tracheal deviation present. No thyromegaly present.  Cardiovascular: Normal rate, regular rhythm, normal heart sounds and intact distal pulses.  Exam reveals no gallop and no friction rub.   No murmur heard. Pulmonary/Chest: Effort normal and breath sounds normal. No stridor. No respiratory distress. She has no wheezes. She has no rales. She exhibits no tenderness.  Abdominal: Soft. Bowel sounds are normal. She exhibits no distension and no mass. There is no tenderness. There is no  rebound and no guarding.  Musculoskeletal: Normal range of motion. She exhibits no edema or tenderness.  Lymphadenopathy:    She has no cervical adenopathy.  Neurological: She is alert and oriented to person, place, and time. She has normal reflexes. No cranial nerve deficit. She exhibits normal muscle tone. Coordination normal.  Skin: Skin is warm and dry. No rash noted. She is not diaphoretic. No erythema. No pallor.  Psychiatric: She has a normal mood and affect. Her behavior is normal. Judgment and thought content normal.  Nursing note and vitals reviewed.   ED Course  Procedures   DIAGNOSTIC STUDIES:  Oxygen Saturation is 97% on RA, normal by my interpretation.    COORDINATION OF CARE:  1:40 PM Pt updated with results. Discussed treatment plan with pt at bedside and pt agreed to plan.  Labs Review Labs Reviewed  RAPID STREP SCREEN (NOT AT [facility])  CULTURE, GROUP A STREP [facility])   I have personally reviewed and evaluated  lab results as part of my medical decision-making.   MDM   Pt febrile with sore throat, mild dysphagia with associated URI sx. Treated in the Ed with Toradol and  Decadron. Rapid strep was negative. Likely viral pharyngitis and viral URI.  No concern for peritonsillar abscess or infection spreading to soft tissue. No trismus or uvula deviation.  No cervical lymphadenopathy. The uvula is midline, tonsils symmetrical with erythema and exudate.  Pt appears mildly dehydrated, discussed importance of water rehydration.  Patient is able to drink liquids but states it is painful.  She did drink multiple cups of ice water and apple juice in the ER.   Specific return precautions discussed.  Recommended PCP follow up. Work note provided   Final diagnoses:  Viral pharyngitis  URI (upper respiratory infection)   I personally performed the services described in this documentation, which was scribed in my presence. The recorded information has been reviewed and is accurate.      Delsa Grana, PA-C 08/11/15 1406  Blanchie Dessert, MD 08/14/15 2106

## 2015-08-11 NOTE — Discharge Instructions (Signed)
Pharyngitis °Pharyngitis is redness, pain, and swelling (inflammation) of your pharynx.  °CAUSES  °Pharyngitis is usually caused by infection. Most of the time, these infections are from viruses (viral) and are part of a cold. However, sometimes pharyngitis is caused by bacteria (bacterial). Pharyngitis can also be caused by allergies. Viral pharyngitis may be spread from person to person by coughing, sneezing, and personal items or utensils (cups, forks, spoons, toothbrushes). Bacterial pharyngitis may be spread from person to person by more intimate contact, such as kissing.  °SIGNS AND SYMPTOMS  °Symptoms of pharyngitis include:   °· Sore throat.   °· Tiredness (fatigue).   °· Low-grade fever.   °· Headache. °· Joint pain and muscle aches. °· Skin rashes. °· Swollen lymph nodes. °· Plaque-like film on throat or tonsils (often seen with bacterial pharyngitis). °DIAGNOSIS  °Your health care provider will ask you questions about your illness and your symptoms. Your medical history, along with a physical exam, is often all that is needed to diagnose pharyngitis. Sometimes, a rapid strep test is done. Other lab tests may also be done, depending on the suspected cause.  °TREATMENT  °Viral pharyngitis will usually get better in 3-4 days without the use of medicine. Bacterial pharyngitis is treated with medicines that kill germs (antibiotics).  °HOME CARE INSTRUCTIONS  °· Drink enough water and fluids to keep your urine clear or pale yellow.   °· Only take over-the-counter or prescription medicines as directed by your health care provider:   °· If you are prescribed antibiotics, make sure you finish them even if you start to feel better.   °· Do not take aspirin.   °· Get lots of rest.   °· Gargle with 8 oz of salt water (½ tsp of salt per 1 qt of water) as often as every 1-2 hours to soothe your throat.   °· Throat lozenges (if you are not at risk for choking) or sprays may be used to soothe your throat. °SEEK MEDICAL  CARE IF:  °· You have large, tender lumps in your neck. °· You have a rash. °· You cough up green, yellow-brown, or bloody spit. °SEEK IMMEDIATE MEDICAL CARE IF:  °· Your neck becomes stiff. °· You drool or are unable to swallow liquids. °· You vomit or are unable to keep medicines or liquids down. °· You have severe pain that does not go away with the use of recommended medicines. °· You have trouble breathing (not caused by a stuffy nose). °MAKE SURE YOU:  °· Understand these instructions. °· Will watch your condition. °· Will get help right away if you are not doing well or get worse. °  °This information is not intended to replace advice given to you by your health care provider. Make sure you discuss any questions you have with your health care provider. °  °Document Released: 05/28/2005 Document Revised: 03/18/2013 Document Reviewed: 02/02/2013 °Elsevier Interactive Patient Education ©2016 Elsevier Inc[employer] °Upper Respiratory Infection, Adult °Most upper respiratory infections (URIs) are a viral infection of the air passages leading to the lungs. A URI affects the nose, throat, and upper air passages. The most common type of URI is nasopharyngitis and is typically referred to as "the common cold." °URIs run their course and usually go away on their own. Most of the time, a URI does not require medical attention, but sometimes a bacterial infection in the upper airways can follow a viral infection. This is called a secondary infection. Sinus and middle ear infections are common types of secondary upper respiratory infections. °Bacterial pneumonia   can also complicate a URI. A URI can worsen asthma and chronic obstructive pulmonary disease (COPD). Sometimes, these complications can require emergency medical care and may be life threatening.  °CAUSES °Almost all URIs are caused by viruses. A virus is a type of germ and can spread from one person to another.  °RISKS FACTORS °You may be at risk for a URI if:  °· You  smoke.   °· You have chronic heart or lung disease. °· You have a weakened defense (immune) system.   °· You are very young or very old.   °· You have nasal allergies or asthma. °· You work in crowded or poorly ventilated areas. °· You work in health care facilities or schools. °SIGNS AND SYMPTOMS  °Symptoms typically develop 2-3 days after you come in contact with a cold virus. Most viral URIs last 7-10 days. However, viral URIs from the influenza virus (flu virus) can last 14-18 days and are typically more severe. Symptoms may include:  °· Runny or stuffy (congested) nose.   °· Sneezing.   °· Cough.   °· Sore throat.   °· Headache.   °· Fatigue.   °· Fever.   °· Loss of appetite.   °· Pain in your forehead, behind your eyes, and over your cheekbones (sinus pain). °· Muscle aches.   °DIAGNOSIS  °Your health care provider may diagnose a URI by: °· Physical exam. °· Tests to check that your symptoms are not due to another condition such as: °¨ Strep throat. °¨ Sinusitis. °¨ Pneumonia. °¨ Asthma. °TREATMENT  °A URI goes away on its own with time. It cannot be cured with medicines, but medicines may be prescribed or recommended to relieve symptoms. Medicines may help: °· Reduce your fever. °· Reduce your cough. °· Relieve nasal congestion. °HOME CARE INSTRUCTIONS  °· Take medicines only as directed by your health care provider.   °· Gargle warm saltwater or take cough drops to comfort your throat as directed by your health care provider. °· Use a warm mist humidifier or inhale steam from a shower to increase air moisture. This may make it easier to breathe. °· Drink enough fluid to keep your urine clear or pale yellow.   °· Eat soups and other clear broths and maintain good nutrition.   °· Rest as needed.   °· Return to work when your temperature has returned to normal or as your health care provider advises. You may need to stay home longer to avoid infecting others. You can also use a face mask and careful hand  washing to prevent spread of the virus. °· Increase the usage of your inhaler if you have asthma.   °· Do not use any tobacco products, including cigarettes, chewing tobacco, or electronic cigarettes. If you need help quitting, ask your health care provider. °PREVENTION  °The best way to protect yourself from getting a cold is to practice good hygiene.  °· Avoid oral or hand contact with people with cold symptoms.   °· Wash your hands often if contact occurs.   °There is no clear evidence that vitamin C, vitamin E, echinacea, or exercise reduces the chance of developing a cold. However, it is always recommended to get plenty of rest, exercise, and practice good nutrition.  °SEEK MEDICAL CARE IF:  °· You are getting worse rather than better.   °· Your symptoms are not controlled by medicine.   °· You have chills. °· You have worsening shortness of breath. °· You have brown or red mucus. °· You have yellow or brown nasal discharge. °· You have pain in your face, especially   when you bend forward. °· You have a fever. °· You have swollen neck glands. °· You have pain while swallowing. °· You have white areas in the back of your throat. °SEEK IMMEDIATE MEDICAL CARE IF:  °· You have severe or persistent: °¨ Headache. °¨ Ear pain. °¨ Sinus pain. °¨ Chest pain. °· You have chronic lung disease and any of the following: °¨ Wheezing. °¨ Prolonged cough. °¨ Coughing up blood. °¨ A change in your usual mucus. °· You have a stiff neck. °· You have changes in your: °¨ Vision. °¨ Hearing. °¨ Thinking. °¨ Mood. °MAKE SURE YOU:  °· Understand these instructions. °· Will watch your condition. °· Will get help right away if you are not doing well or get worse. °  °This information is not intended to replace advice given to you by your health care provider. Make sure you discuss any questions you have with your health care provider. °  °Document Released: 11/21/2000 Document Revised: 10/12/2014 Document Reviewed: 09/02/2013 °Elsevier  Interactive Patient Education ©2016 Elsevier Inc. ° °

## 2015-08-11 NOTE — ED Notes (Signed)
Pt c/o 2 day history of sore throat, white discharge in the back of her throat, headaches and fevers.

## 2015-08-11 NOTE — ED Notes (Signed)
Declined W/C at D/C and was escorted to lobby by RN. 

## 2015-08-13 LAB — CULTURE, GROUP A STREP (THRC)

## 2015-10-04 ENCOUNTER — Emergency Department (HOSPITAL_COMMUNITY)
Admission: EM | Admit: 2015-10-04 | Discharge: 2015-10-04 | Disposition: A | Discharge status: Home / Self Care | Payer: Medicaid Other | Attending: Emergency Medicine | Admitting: Emergency Medicine

## 2015-10-04 ENCOUNTER — Encounter (HOSPITAL_COMMUNITY): Payer: Self-pay | Admitting: Emergency Medicine

## 2015-10-04 DIAGNOSIS — R51 Headache: Secondary | ICD-10-CM | POA: Diagnosis present

## 2015-10-04 DIAGNOSIS — Z791 Long term (current) use of non-steroidal anti-inflammatories (NSAID): Secondary | ICD-10-CM | POA: Insufficient documentation

## 2015-10-04 DIAGNOSIS — M1990 Unspecified osteoarthritis, unspecified site: Secondary | ICD-10-CM | POA: Insufficient documentation

## 2015-10-04 DIAGNOSIS — H748X3 Other specified disorders of middle ear and mastoid, bilateral: Secondary | ICD-10-CM | POA: Insufficient documentation

## 2015-10-04 DIAGNOSIS — J309 Allergic rhinitis, unspecified: Secondary | ICD-10-CM | POA: Insufficient documentation

## 2015-10-04 DIAGNOSIS — Z79899 Other long term (current) drug therapy: Secondary | ICD-10-CM | POA: Diagnosis not present

## 2015-10-04 DIAGNOSIS — Z792 Long term (current) use of antibiotics: Secondary | ICD-10-CM | POA: Insufficient documentation

## 2015-10-04 DIAGNOSIS — Z8619 Personal history of other infectious and parasitic diseases: Secondary | ICD-10-CM | POA: Diagnosis not present

## 2015-10-04 DIAGNOSIS — F17210 Nicotine dependence, cigarettes, uncomplicated: Secondary | ICD-10-CM | POA: Diagnosis not present

## 2015-10-04 DIAGNOSIS — Z8679 Personal history of other diseases of the circulatory system: Secondary | ICD-10-CM | POA: Diagnosis not present

## 2015-10-04 DIAGNOSIS — J0110 Acute frontal sinusitis, unspecified: Secondary | ICD-10-CM | POA: Diagnosis not present

## 2015-10-04 DIAGNOSIS — Z7952 Long term (current) use of systemic steroids: Secondary | ICD-10-CM | POA: Diagnosis not present

## 2015-10-04 MED ORDER — FLUTICASONE PROPIONATE 50 MCG/ACT NA SUSP: 2.0000 | Freq: Every day | NASAL | Status: DC

## 2015-10-04 MED ORDER — ACETAMINOPHEN 325 MG PO TABS
650.0000 mg | ORAL_TABLET | Freq: Once | ORAL | Status: AC
  Administered 2015-10-04: 650 mg via ORAL
  Filled 2015-10-04: qty 2

## 2015-10-04 MED ORDER — IBUPROFEN 200 MG PO TABS
400.0000 mg | ORAL_TABLET | Freq: Once | ORAL | Status: AC | PRN
  Administered 2015-10-04: 400 mg via ORAL
  Filled 2015-10-04: qty 2

## 2015-10-04 MED ORDER — CETIRIZINE HCL 10 MG PO TABS: 10.0000 mg | ORAL_TABLET | Freq: Every day | ORAL | Status: DC

## 2015-10-04 MED ORDER — NAPROXEN 250 MG PO TABS: 250.0000 mg | ORAL_TABLET | Freq: Two times a day (BID) | ORAL | Status: DC

## 2015-10-04 NOTE — Discharge Instructions (Signed)
Sinusitis, Adult °Sinusitis is redness, soreness, and inflammation of the paranasal sinuses. Paranasal sinuses are air pockets within the bones of your face. They are located beneath your eyes, in the middle of your forehead, and above your eyes. In healthy paranasal sinuses, mucus is able to drain out, and air is able to circulate through them by way of your nose. However, when your paranasal sinuses are inflamed, mucus and air can become trapped. This can allow bacteria and other germs to grow and cause infection. °Sinusitis can develop quickly and last only a short time (acute) or continue over a long period (chronic). Sinusitis that lasts for more than 12 weeks is considered chronic. °CAUSES °Causes of sinusitis include: °· Allergies. °· Structural abnormalities, such as displacement of the cartilage that separates your nostrils (deviated septum), which can decrease the air flow through your nose and sinuses and affect sinus drainage. °· Functional abnormalities, such as when the small hairs (cilia) that line your sinuses and help remove mucus do not work properly or are not present. °SIGNS AND SYMPTOMS °Symptoms of acute and chronic sinusitis are the same. The primary symptoms are pain and pressure around the affected sinuses. Other symptoms include: °· Upper toothache. °· Earache. °· Headache. °· Bad breath. °· Decreased sense of smell and taste. °· A cough, which worsens when you are lying flat. °· Fatigue. °· Fever. °· Thick drainage from your nose, which often is green and may contain pus (purulent). °· Swelling and warmth over the affected sinuses. °DIAGNOSIS °Your health care provider will perform a physical exam. During your exam, your health care provider may perform any of the following to help determine if you have acute sinusitis or chronic sinusitis: °· Look in your nose for signs of abnormal growths in your nostrils (nasal polyps). °· Tap over the affected sinus to check for signs of  infection. °· View the inside of your sinuses using an imaging device that has a light attached (endoscope). °If your health care provider suspects that you have chronic sinusitis, one or more of the following tests may be recommended: °· Allergy tests. °· Nasal culture. A sample of mucus is taken from your nose, sent to a lab, and screened for bacteria. °· Nasal cytology. A sample of mucus is taken from your nose and examined by your health care provider to determine if your sinusitis is related to an allergy. °TREATMENT °Most cases of acute sinusitis are related to a viral infection and will resolve on their own within 10 days. Sometimes, medicines are prescribed to help relieve symptoms of both acute and chronic sinusitis. These may include pain medicines, decongestants, nasal steroid sprays, or saline sprays. °However, for sinusitis related to a bacterial infection, your health care provider will prescribe antibiotic medicines. These are medicines that will help kill the bacteria causing the infection. °Rarely, sinusitis is caused by a fungal infection. In these cases, your health care provider will prescribe antifungal medicine. °For some cases of chronic sinusitis, surgery is needed. Generally, these are cases in which sinusitis recurs more than 3 times per year, despite other treatments. °HOME CARE INSTRUCTIONS °· Drink plenty of water. Water helps thin the mucus so your sinuses can drain more easily. °· Use a humidifier. °· Inhale steam 3-4 times a day (for example, sit in the bathroom with the shower running). °· Apply a warm, moist washcloth to your face 3-4 times a day, or as directed by your health care provider. °· Use saline nasal sprays to help   moisten and clean your sinuses.  Take medicines only as directed by your health care provider.  If you were prescribed either an antibiotic or antifungal medicine, finish it all even if you start to feel better. SEEK IMMEDIATE MEDICAL CARE IF:  You have  increasing pain or severe headaches.  You have nausea, vomiting, or drowsiness.  You have swelling around your face.  You have vision problems.  You have a stiff neck.  You have difficulty breathing.   This information is not intended to replace advice given to you by your health care provider. Make sure you discuss any questions you have with your health care provider.   Document Released: 05/28/2005 Document Revised: 06/18/2014 Document Reviewed: 06/12/2011 Elsevier Interactive Patient Education 2016 [employer].  Allergic Rhinitis Allergic rhinitis is when the mucous membranes in the nose respond to allergens. Allergens are particles in the air that cause your body to have an allergic reaction. This causes you to release allergic antibodies. Through a chain of events, these eventually cause you to release histamine into the blood stream. Although meant to protect the body, it is this release of histamine that causes your discomfort, such as frequent sneezing, congestion, and an itchy, runny nose.  CAUSES Seasonal allergic rhinitis (hay fever) is caused by pollen allergens that may come from grasses, trees, and weeds. Year-round allergic rhinitis (perennial allergic rhinitis) is caused by allergens such as house dust mites, pet dander, and mold spores. SYMPTOMS  Nasal stuffiness (congestion).  Itchy, runny nose with sneezing and tearing of the eyes. DIAGNOSIS Your health care provider can help you determine the allergen or allergens that trigger your symptoms. If you and your health care provider are unable to determine the allergen, skin or blood testing may be used. Your health care provider will diagnose your condition after taking your health history and performing a physical exam. Your health care provider may assess you for other related conditions, such as asthma, pink eye, or an ear infection. TREATMENT Allergic rhinitis does not have a cure, but it can be controlled  by:  Medicines that block allergy symptoms. These may include allergy shots, nasal sprays, and oral antihistamines.  Avoiding the allergen. Hay fever may often be treated with antihistamines in pill or nasal spray forms. Antihistamines block the effects of histamine. There are over-the-counter medicines that may help with nasal congestion and swelling around the eyes. Check with your health care provider before taking or giving this medicine. If avoiding the allergen or the medicine prescribed do not work, there are many new medicines your health care provider can prescribe. Stronger medicine may be used if initial measures are ineffective. Desensitizing injections can be used if medicine and avoidance does not work. Desensitization is when a patient is given ongoing shots until the body becomes less sensitive to the allergen. Make sure you follow up with your health care provider if problems continue. HOME CARE INSTRUCTIONS It is not possible to completely avoid allergens, but you can reduce your symptoms by taking steps to limit your exposure to them. It helps to know exactly what you are allergic to so that you can avoid your specific triggers. SEEK MEDICAL CARE IF:  You have a fever.  You develop a cough that does not stop easily (persistent).  You have shortness of breath.  You start wheezing.  Symptoms interfere with normal daily activities.   This information is not intended to replace advice given to you by your health care provider. Make sure you  discuss any questions you have with your health care provider.   Document Released: 02/20/2001 Document Revised: 06/18/2014 Document Reviewed: 02/02/2013 Elsevier Interactive Patient Education [employer].

## 2015-10-04 NOTE — ED Provider Notes (Signed)
CSN: [id number]     Arrival date & time 10/04/15  1335 History  By signing my name below, I, Evelene Croon, attest that this documentation has been prepared under the direction and in the presence of non-physician practitioner, Waynetta Pean, PA-C. Electronically Signed: Evelene Croon, Scribe. 10/04/2015. 3:30 PM.  Chief Complaint  Patient presents with  . Sinusitis  . Otalgia   The history is provided by the patient. No language interpreter was used.    HPI Comments:  Ashley Spencer is a 30 y.o. female who presents to the Emergency Department complaining of sinus pressure and constant pain  X 3 days. She reports associated sinus congestion, sinus HA, bilateral ear pain/pressure, and fever with TMAX of 102 yesterday; no fever today. She has taken mucinex without relief. She denies cough, sore throat, and SOB.   Past Medical History  Diagnosis Date  . No pertinent past medical history   . Trichomonas   . Arthritis   . Headache   . Migraine    Past Surgical History  Procedure Laterality Date  . Dilation and curettage of uterus     Family History  Problem Relation Age of Onset  . Migraines Father 34  . Hypertension Brother   . Hypertension Maternal Aunt   . Hypertension Maternal Uncle   . Hypertension Maternal Grandmother    Social History  Substance Use Topics  . Smoking status: Current Some Day Smoker -- 1.00 packs/day for 3 years    Types: Cigarettes  . Smokeless tobacco: Never Used  . Alcohol Use: No   OB History    Gravida Para Term Preterm AB TAB SAB Ectopic Multiple Living   5 3 3  2 2    3      Review of Systems  Constitutional: Positive for fever. Negative for chills.  HENT: Positive for congestion, ear pain, postnasal drip, rhinorrhea, sinus pressure and sneezing. Negative for ear discharge, hearing loss, sore throat and trouble swallowing.   Eyes: Negative for visual disturbance.  Respiratory: Negative for cough, shortness of breath and wheezing.    Cardiovascular: Negative for chest pain.  Gastrointestinal: Negative for nausea and vomiting.  Skin: Negative for rash.  Neurological: Positive for headaches. Negative for dizziness and light-headedness.   Allergies  Review of patient's allergies indicates no known allergies.  Home Medications   Prior to Admission medications   Medication Sig Start Date End Date Taking? Authorizing Provider  cetirizine (ZYRTEC ALLERGY) 10 MG tablet Take 1 tablet (10 mg total) by mouth daily. 10/04/15   Waynetta Pean, PA-C  fluticasone (FLONASE) 50 MCG/ACT nasal spray Place 2 sprays into both nostrils daily. 10/04/15   Waynetta Pean, PA-C  guaiFENesin-codeine 100-10 MG/5ML syrup Take 5 mLs by mouth 3 (three) times daily as needed for cough. 08/11/15   Delsa Grana, PA-C  HYDROcodone-acetaminophen (NORCO/VICODIN) 5-325 MG tablet Take 1 tablet by mouth every 6 (six) hours as needed for moderate pain. 04/26/15   Dalia Heading, PA-C  ibuprofen (ADVIL,MOTRIN) 800 MG tablet Take 1 tablet (800 mg total) by mouth 3 (three) times daily. 08/11/15   Delsa Grana, PA-C  lidocaine (XYLOCAINE) 2 % solution Use as directed 10 mLs in the mouth or throat every 6 (six) hours as needed for mouth pain. 08/11/15   Delsa Grana, PA-C  naproxen (NAPROSYN) 250 MG tablet Take 1 tablet (250 mg total) by mouth 2 (two) times daily with a meal. 10/04/15   Waynetta Pean, PA-C  naproxen sodium (ANAPROX) 220 MG tablet Take 220 mg  by mouth 2 (two) times daily as needed (pain). Aleve    Historical Provider, MD  oxyCODONE-acetaminophen (PERCOCET/ROXICET) 5-325 MG per tablet Take 2 tablets by mouth every 4 (four) hours as needed. 08/29/14   Tanna Furry, MD  penicillin v potassium (VEETID) 500 MG tablet Take 1 tablet (500 mg total) by mouth 3 (three) times daily. 12/28/14   Domenic Moras, PA-C  predniSONE (DELTASONE) 50 MG tablet Take 1 tablet (50 mg total) by mouth daily with breakfast. 04/26/15   Dalia Heading, PA-C   BP 121/89 mmHg  Pulse 94   Temp(Src) 98.3 F (36.8 C) (Oral)  Resp 18  SpO2 100%  LMP 09/20/2015 Physical Exam  Constitutional: She appears well-developed and well-nourished. No distress.  Nontoxic appearing.  HENT:  Head: Normocephalic and atraumatic.  Right Ear: External ear normal. A middle ear effusion is present.  Left Ear: External ear normal. A middle ear effusion is present.  Mouth/Throat: Uvula is midline and oropharynx is clear and moist. No oropharyngeal exudate.  Mild middle ear effusion bilaterally.  No TM erythema or loss of landmarks  Maxillary and frontal sinus tenderness to palpation.  No tonsillar hypertrophy or exudate.  Eyes: Conjunctivae are normal. Pupils are equal, round, and reactive to light. Right eye exhibits no discharge. Left eye exhibits no discharge.  Neck: Normal range of motion. Neck supple. No JVD present. No tracheal deviation present.  Cardiovascular: Normal rate, regular rhythm, normal heart sounds and intact distal pulses.   Pulmonary/Chest: Effort normal and breath sounds normal. No stridor. No respiratory distress. She has no wheezes. She has no rales.  Lungs are clear to auscultation bilaterally.  Abdominal: Soft. There is no tenderness.  Lymphadenopathy:    She has no cervical adenopathy.  Neurological: She is alert. Coordination normal.  Skin: Skin is warm and dry. No rash noted. She is not diaphoretic. No erythema. No pallor.  Psychiatric: She has a normal mood and affect. Her behavior is normal.  Nursing note and vitals reviewed.   ED Course  Procedures   DIAGNOSTIC STUDIES:  Oxygen Saturation is 100% on RA, normal by my interpretation.    COORDINATION OF CARE:  3:28 PM Discussed treatment plan with pt at bedside and pt agreed to plan.   MDM   Meds given in ED:  Medications  acetaminophen (TYLENOL) tablet 650 mg (not administered)  ibuprofen (ADVIL,MOTRIN) tablet 400 mg (400 mg Oral Given 10/04/15 1422)    New Prescriptions   CETIRIZINE (ZYRTEC  ALLERGY) 10 MG TABLET    Take 1 tablet (10 mg total) by mouth daily.   FLUTICASONE (FLONASE) 50 MCG/ACT NASAL SPRAY    Place 2 sprays into both nostrils daily.   NAPROXEN (NAPROSYN) 250 MG TABLET    Take 1 tablet (250 mg total) by mouth 2 (two) times daily with a meal.       Final diagnoses:  Acute frontal sinusitis, recurrence not specified  Allergic rhinitis, unspecified allergic rhinitis type   This is a 30 y.o. female who presents to the Emergency Department complaining of sinus pressure and constant pain  X 3 days. She reports associated sinus congestion, sinus HA, bilateral ear pain/pressure, and fever with TMAX of 102 yesterday; no fever today. She has taken mucinex without relief. On exam she is afebrile and non-toxic appearing.  Patient complaining of symptoms of sinusitis.  Mild to moderate symptoms of sinus pressure, clear/yellow nasal discharge, and congestion for less than 4 days.  Patient is afebrile.  No concern for acute bacterial  rhinosinusitis; likely viral in nature.  Patient discharged with symptomatic treatment. Prescriptions provided for Zyrtec, Flonase and naproxen. Recommendations for follow-up with primary care physician. I advised the patient to follow-up with their primary care provider this week. I advised the patient to return to the emergency department with new or worsening symptoms or new concerns. The patient verbalized understanding and agreement with plan.    I personally performed the services described in this documentation, which was scribed in my presence. The recorded information has been reviewed and is accurate.     Waynetta Pean, PA-C 10/04/15 [employer] Y[employer], MD 10/06/15 [phone]

## 2015-10-04 NOTE — ED Notes (Signed)
Patient presents for sinus pressure, bilateral ear pain, congestion, fever (102) yesterday. History of sinus headaches.

## 2016-03-27 ENCOUNTER — Ambulatory Visit (HOSPITAL_COMMUNITY)
Admission: EM | Admit: 2016-03-27 | Discharge: 2016-03-27 | Disposition: A | Discharge status: Home / Self Care | Payer: Medicaid Other | Attending: Family Medicine | Admitting: Family Medicine

## 2016-03-27 ENCOUNTER — Encounter (HOSPITAL_COMMUNITY): Payer: Self-pay | Admitting: Emergency Medicine

## 2016-03-27 DIAGNOSIS — K0889 Other specified disorders of teeth and supporting structures: Secondary | ICD-10-CM

## 2016-03-27 MED ORDER — NAPROXEN 250 MG PO TABS: 250.0000 mg | ORAL_TABLET | Freq: Two times a day (BID) | ORAL | 0 refills | Status: DC

## 2016-03-27 MED ORDER — PENICILLIN V POTASSIUM 500 MG PO TABS: 500.0000 mg | ORAL_TABLET | Freq: Three times a day (TID) | ORAL | 0 refills | Status: DC

## 2016-03-27 MED ORDER — DICLOFENAC SODIUM 75 MG PO TBEC: 75.0000 mg | DELAYED_RELEASE_TABLET | Freq: Two times a day (BID) | ORAL | 1 refills | Status: DC

## 2016-03-27 NOTE — ED Triage Notes (Signed)
Pt c/o left upper dental pain onset 4 days associated w/left ear pain and HA  Denies fevers  A&O X4... NAD

## 2016-03-27 NOTE — ED Provider Notes (Signed)
[facility]    CSN: [id number] Arrival date & time: 03/27/16  1657     History   Chief Complaint Chief Complaint  Patient presents with  . Dental Pain    HPI Ashley Spencer is a 30 y.o. female.   This is a 30 year old woman who is owing to [employer] to get her GED. She comes in with 4 days of left-sided dental pain associated with a persistent headache which she considers to be a migraine. She has a Pharmacist, community but is been unable to get an appointment until later. She missed her appointment yesterday.      Past Medical History:  Diagnosis Date  . Arthritis   . Headache   . Migraine   . No pertinent past medical history   . Trichomonas     There are no active problems to display for this patient.   Past Surgical History:  Procedure Laterality Date  . DILATION AND CURETTAGE OF UTERUS      OB History    Gravida Para Term Preterm AB Living   5 3 3   2 3    SAB TAB Ectopic Multiple Live Births     2             Home Medications    Prior to Admission medications   Medication Sig Start Date End Date Taking? Authorizing Provider  cetirizine (ZYRTEC ALLERGY) 10 MG tablet Take 1 tablet (10 mg total) by mouth daily. 10/04/15   Waynetta Pean, PA-C  diclofenac (VOLTAREN) 75 MG EC tablet Take 1 tablet (75 mg total) by mouth 2 (two) times daily. 03/27/16   Robyn Haber, MD  fluticasone (FLONASE) 50 MCG/ACT nasal spray Place 2 sprays into both nostrils daily. 10/04/15   Waynetta Pean, PA-C  naproxen (NAPROSYN) 250 MG tablet Take 1 tablet (250 mg total) by mouth 2 (two) times daily with a meal. 03/27/16   Robyn Haber, MD  penicillin v potassium (VEETID) 500 MG tablet Take 1 tablet (500 mg total) by mouth 3 (three) times daily. 03/27/16   Robyn Haber, MD    Family History Family History  Problem Relation Age of Onset  . Migraines Father 54  . Hypertension Brother   . Hypertension Maternal Aunt   . Hypertension Maternal Uncle   . Hypertension Maternal  Grandmother     Social History Social History  Substance Use Topics  . Smoking status: Current Some Day Smoker    Packs/day: 1.00    Years: 3.00    Types: Cigarettes  . Smokeless tobacco: Never Used  . Alcohol use No     Allergies   Review of patient's allergies indicates no known allergies.   Review of Systems Review of Systems  Constitutional: Negative.   HENT: Positive for dental problem and ear pain.   Eyes: Negative.   Respiratory: Negative.      Physical Exam Triage Vital Signs ED Triage Vitals  Enc Vitals Group     BP 03/27/16 1713 117/77     Pulse Rate 03/27/16 1713 86     Resp 03/27/16 1713 18     Temp 03/27/16 1713 98.4 F (36.9 C)     Temp Source 03/27/16 1713 Oral     SpO2 03/27/16 1713 100 %     Weight --      Height --      Head Circumference --      Peak Flow --      Pain Score 03/27/16 1718 10  Pain Loc --      Pain Edu? --      Excl. in [place]? --    No data found.   Updated Vital Signs BP 117/77 (BP Location: Left Arm)   Pulse 86   Temp 98.4 F (36.9 C) (Oral)   Resp 18   LMP 03/15/2016   SpO2 100%      Physical Exam  Constitutional: She appears well-developed and well-nourished.  HENT:  Head: Normocephalic.  Right Ear: External ear normal.  Left Ear: External ear normal.  Mouth/Throat: Oropharynx is clear and moist.  Tooth #17 is impacted with some gingival swelling and erythema.  Eyes: Conjunctivae and EOM are normal. Pupils are equal, round, and reactive to light.  Neck: Normal range of motion. Neck supple.  Nursing note and vitals reviewed.    UC Treatments / Results  Labs (all labs ordered are listed, but only abnormal results are displayed) Labs Reviewed - No data to display  EKG  EKG Interpretation None       Radiology No results found.  Procedures Procedures (including critical care time)  Medications Ordered in UC Medications - No data to display   Initial Impression / Assessment and Plan / UC  Course  I have reviewed the triage vital signs and the nursing notes.  Pertinent labs & imaging results that were available during my care of the patient were reviewed by me and considered in my medical decision making (see chart for details).  Clinical Course      Final Clinical Impressions(s) / UC Diagnoses   Final diagnoses:  Pain, dental    New Prescriptions New Prescriptions   DICLOFENAC (VOLTAREN) 75 MG EC TABLET    Take 1 tablet (75 mg total) by mouth 2 (two) times daily.  Naprosyn and Pen V refilled.   Robyn Haber, MD 03/27/16 1734

## 2016-03-27 NOTE — Discharge Instructions (Signed)
You will need to see a dentist. I notice that over the last year or 2 you've received quite a few prescriptions for narcotics. These tend to make dental problems more frequent and are not a good long-term solution.  1 dentist that offers more favorable dental care is Pat Patrick, DDS and her office is near [place].

## 2016-06-11 DIAGNOSIS — Z87448 Personal history of other diseases of urinary system: Secondary | ICD-10-CM

## 2016-06-11 HISTORY — DX: Personal history of other diseases of urinary system: Z87.448

## 2016-09-05 ENCOUNTER — Encounter (HOSPITAL_COMMUNITY): Payer: Self-pay | Admitting: *Deleted

## 2016-09-05 ENCOUNTER — Emergency Department (HOSPITAL_COMMUNITY)
Admission: EM | Admit: 2016-09-05 | Discharge: 2016-09-05 | Disposition: A | Discharge status: Home / Self Care | Payer: Medicaid Other | Attending: Emergency Medicine | Admitting: Emergency Medicine

## 2016-09-05 DIAGNOSIS — K0889 Other specified disorders of teeth and supporting structures: Secondary | ICD-10-CM | POA: Diagnosis not present

## 2016-09-05 DIAGNOSIS — F17210 Nicotine dependence, cigarettes, uncomplicated: Secondary | ICD-10-CM | POA: Insufficient documentation

## 2016-09-05 DIAGNOSIS — J020 Streptococcal pharyngitis: Secondary | ICD-10-CM | POA: Diagnosis not present

## 2016-09-05 DIAGNOSIS — J029 Acute pharyngitis, unspecified: Secondary | ICD-10-CM | POA: Diagnosis present

## 2016-09-05 LAB — RAPID STREP SCREEN (MED CTR MEBANE ONLY): Streptococcus, Group A Screen (Direct): POSITIVE — AB

## 2016-09-05 MED ORDER — DEXAMETHASONE SODIUM PHOSPHATE 10 MG/ML IJ SOLN
10.0000 mg | Freq: Once | INTRAMUSCULAR | Status: AC
  Administered 2016-09-05: 10 mg via INTRAMUSCULAR
  Filled 2016-09-05: qty 1

## 2016-09-05 MED ORDER — AMOXICILLIN 500 MG PO CAPS
500.0000 mg | ORAL_CAPSULE | Freq: Two times a day (BID) | ORAL | Status: DC
  Administered 2016-09-05: 500 mg via ORAL
  Filled 2016-09-05: qty 1

## 2016-09-05 MED ORDER — AMOXICILLIN 500 MG PO CAPS: 500.0000 mg | ORAL_CAPSULE | Freq: Three times a day (TID) | ORAL | 0 refills | Status: DC

## 2016-09-05 MED ORDER — TRAMADOL HCL 50 MG PO TABS: 50.0000 mg | ORAL_TABLET | Freq: Four times a day (QID) | ORAL | 0 refills | Status: DC | PRN

## 2016-09-05 MED ORDER — TRAMADOL HCL 50 MG PO TABS
50.0000 mg | ORAL_TABLET | Freq: Once | ORAL | Status: AC
  Administered 2016-09-05: 50 mg via ORAL
  Filled 2016-09-05: qty 1

## 2016-09-05 MED ORDER — IBUPROFEN 400 MG PO TABS
600.0000 mg | ORAL_TABLET | Freq: Once | ORAL | Status: AC
  Administered 2016-09-05: 600 mg via ORAL
  Filled 2016-09-05: qty 1

## 2016-09-05 NOTE — ED Notes (Signed)
Pt handled  Po fluids well

## 2016-09-05 NOTE — ED Notes (Signed)
Sore throat and swollen tonsil x 3 days and states has a hole in her tooth  rt side,

## 2016-09-05 NOTE — ED Provider Notes (Signed)
[facility] DEPT Provider Note   CSN: [id number] Arrival date & time: 09/05/16  3474   By signing my name below, I, Delton Prairie, attest that this documentation has been prepared under the direction and in the presence of  Ocie Cornfield, PA-C. Electronically Signed: Delton Prairie, ED Scribe. 09/05/16. 10:42 AM.   History   Chief Complaint Chief Complaint  Patient presents with  . Sore Throat  . Dental Pain    HPI Comments:  Ashley Spencer is a 31 y.o. female who presents to the Emergency Department complaining of acute onset, moderate sore throat x yesterday. Pt also reports dental pain x 1 week. Her dental pain is worse upon palpation. No alleviating factors noted. She denies fevers, recent sick contacts, rhinorrhea, a cough or any other associated symptoms. Pt is followed by a dentist but is not able to schedule an appointment until mid 09/2016. Able to tolerate po fluids.   The history is provided by the patient. No language interpreter was used.    Past Medical History:  Diagnosis Date  . Arthritis   . Headache   . Migraine   . No pertinent past medical history   . Trichomonas     There are no active problems to display for this patient.   Past Surgical History:  Procedure Laterality Date  . DILATION AND CURETTAGE OF UTERUS      OB History    Gravida Para Term Preterm AB Living   5 3 3   2 3    SAB TAB Ectopic Multiple Live Births     2             Home Medications    Prior to Admission medications   Medication Sig Start Date End Date Taking? Authorizing Provider  cetirizine (ZYRTEC ALLERGY) 10 MG tablet Take 1 tablet (10 mg total) by mouth daily. 10/04/15   Waynetta Pean, PA-C  diclofenac (VOLTAREN) 75 MG EC tablet Take 1 tablet (75 mg total) by mouth 2 (two) times daily. 03/27/16   Robyn Haber, MD  fluticasone (FLONASE) 50 MCG/ACT nasal spray Place 2 sprays into both nostrils daily. 10/04/15   Waynetta Pean, PA-C  naproxen (NAPROSYN) 250 MG  tablet Take 1 tablet (250 mg total) by mouth 2 (two) times daily with a meal. 03/27/16   Robyn Haber, MD  penicillin v potassium (VEETID) 500 MG tablet Take 1 tablet (500 mg total) by mouth 3 (three) times daily. 03/27/16   Robyn Haber, MD    Family History Family History  Problem Relation Age of Onset  . Migraines Father 81  . Hypertension Brother   . Hypertension Maternal Aunt   . Hypertension Maternal Uncle   . Hypertension Maternal Grandmother     Social History Social History  Substance Use Topics  . Smoking status: Current Some Day Smoker    Packs/day: 1.00    Years: 3.00    Types: Cigarettes  . Smokeless tobacco: Never Used  . Alcohol use No     Allergies   Patient has no known allergies.   Review of Systems Review of Systems  Constitutional: Negative for fever.  HENT: Positive for dental problem and sore throat. Negative for facial swelling and rhinorrhea.   Respiratory: Negative for cough.   All other systems reviewed and are negative.  Physical Exam Updated Vital Signs BP 116/90 (BP Location: Right Arm)   Pulse 80   Temp 98.5 F (36.9 C) (Oral)   Resp 18   Ht 5' (1.524 m)  Wt 115 lb (52.2 kg)   LMP 08/11/2016   SpO2 100%   BMI 22.46 kg/m   Physical Exam  Constitutional: She is oriented to person, place, and time. She appears well-developed and well-nourished.  Non-toxic appearance. No distress.  Managing secreting and maintaining airway.   HENT:  Head: Normocephalic and atraumatic.  Mouth/Throat: Uvula is midline and mucous membranes are normal. No trismus in the jaw. Abnormal dentition. No dental abscesses or uvula swelling. Oropharyngeal exudate, posterior oropharyngeal edema and posterior oropharyngeal erythema present. No tonsillar abscesses. Tonsils are 2+ on the right. Tonsils are 2+ on the left. Tonsillar exudate.    No facial swelling or sublingual/submandibular swelling.   Eyes: Conjunctivae are normal.  Neck: Normal range of  motion. Neck supple.  Cardiovascular: Normal rate.   Pulmonary/Chest: Effort normal.  Abdominal: She exhibits no distension.  Lymphadenopathy:    She has no cervical adenopathy.  Neurological: She is alert and oriented to person, place, and time.  Skin: Skin is warm and dry.  Psychiatric: She has a normal mood and affect.  Nursing note and vitals reviewed.    ED Treatments / Results  DIAGNOSTIC STUDIES:  Oxygen Saturation is 100% on RA, normal by my interpretation.    COORDINATION OF CARE:  10:38 AM Discussed treatment plan with pt at bedside and pt agreed to plan.  Labs (all labs ordered are listed, but only abnormal results are displayed) Labs Reviewed  RAPID STREP SCREEN (NOT AT [facility]) - Abnormal; Notable for the following:       Result Value   Streptococcus, Group A Screen (Direct) POSITIVE (*)    All other components within normal limits    EKG  EKG Interpretation None       Radiology No results found.  Procedures Procedures (including critical care time)  Medications Ordered in ED Medications  dexamethasone (DECADRON) injection 10 mg (not administered)  traMADol (ULTRAM) tablet 50 mg (not administered)  ibuprofen (ADVIL,MOTRIN) tablet 600 mg (not administered)  amoxicillin (AMOXIL) tablet 500 mg (not administered)     Initial Impression / Assessment and Plan / ED Course  I have reviewed the triage vital signs and the nursing notes.  Pertinent labs & imaging results that were available during my care of the patient were reviewed by me and considered in my medical decision making (see chart for details).     Patient presents to the ED with multiple complaints including sore throat and toothache. Pt rapid strep test positive. Pt is tolerating secretions. Presentation not concerning for peritonsillar abscess or spread of infection to deep spaces of the throat; patent airway. Pt will be discharged with amoxicillin.   Patient with toothache.  No gross  abscess.  Exam unconcerning for Ludwig's angina or spread of infection.  Will treat with ammoxicillin and pain medicine.  Urged patient to follow-up with dentist. Specific return precautions discussed. Recommended PCP follow up. Pt appears safe for discharge.    Final Clinical Impressions(s) / ED Diagnoses   Final diagnoses:  Pharyngitis due to Streptococcus species  Pain, dental    New Prescriptions New Prescriptions   AMOXICILLIN (AMOXIL) 500 MG CAPSULE    Take 1 capsule (500 mg total) by mouth 3 (three) times daily.   TRAMADOL (ULTRAM) 50 MG TABLET    Take 1 tablet (50 mg total) by mouth every 6 (six) hours as needed.  I personally performed the services described in this documentation, which was scribed in my presence. The recorded information has been reviewed and is  accurate.     Doristine Devoid, PA-C 09/05/16 [place], MD 09/05/16 [phone]

## 2016-09-05 NOTE — ED Triage Notes (Signed)
Pt reports having a wisdom tooth that is causing her pain and also has sore throat, pain when swallowing. Airway intact. Denies fever.

## 2016-09-05 NOTE — Discharge Instructions (Signed)
Your strep test was positive. We will send her home with amoxicillin which is antibiotic to take for 7 days. This will also cover your tooth infection. Have given you a short course of tramadol for pain. May take Motrin and Tylenol. Warm salt water gargles. Make sure you're staying hydrated with plenty of fluids. Follow up with a dentist. Follow-up with a primary care doctor or return to the ED if your sore throat worsens or the swelling worsens.

## 2016-10-10 ENCOUNTER — Encounter (HOSPITAL_COMMUNITY): Payer: Self-pay | Admitting: *Deleted

## 2016-10-10 ENCOUNTER — Emergency Department (HOSPITAL_COMMUNITY)
Admission: EM | Admit: 2016-10-10 | Discharge: 2016-10-10 | Disposition: A | Discharge status: Home / Self Care | Payer: Medicaid Other | Attending: Emergency Medicine | Admitting: Emergency Medicine

## 2016-10-10 ENCOUNTER — Emergency Department (HOSPITAL_COMMUNITY): Payer: Medicaid Other

## 2016-10-10 DIAGNOSIS — F17210 Nicotine dependence, cigarettes, uncomplicated: Secondary | ICD-10-CM | POA: Insufficient documentation

## 2016-10-10 DIAGNOSIS — R109 Unspecified abdominal pain: Secondary | ICD-10-CM | POA: Diagnosis present

## 2016-10-10 DIAGNOSIS — N12 Tubulo-interstitial nephritis, not specified as acute or chronic: Secondary | ICD-10-CM | POA: Diagnosis not present

## 2016-10-10 DIAGNOSIS — Z79899 Other long term (current) drug therapy: Secondary | ICD-10-CM | POA: Insufficient documentation

## 2016-10-10 LAB — COMPREHENSIVE METABOLIC PANEL
ALT: 10 U/L — AB (ref 14–54)
ANION GAP: 8 (ref 5–15)
AST: 17 U/L (ref 15–41)
Albumin: 3.2 g/dL — ABNORMAL LOW (ref 3.5–5.0)
Alkaline Phosphatase: 82 U/L (ref 38–126)
BUN: 5 mg/dL — ABNORMAL LOW (ref 6–20)
CHLORIDE: 97 mmol/L — AB (ref 101–111)
CO2: 23 mmol/L (ref 22–32)
CREATININE: 1 mg/dL (ref 0.44–1.00)
Calcium: 8.8 mg/dL — ABNORMAL LOW (ref 8.9–10.3)
Glucose, Bld: 109 mg/dL — ABNORMAL HIGH (ref 65–99)
Potassium: 3.3 mmol/L — ABNORMAL LOW (ref 3.5–5.1)
Sodium: 128 mmol/L — ABNORMAL LOW (ref 135–145)
Total Bilirubin: 0.6 mg/dL (ref 0.3–1.2)
Total Protein: 7.6 g/dL (ref 6.5–8.1)

## 2016-10-10 LAB — CBC
HCT: 37.4 % (ref 36.0–46.0)
HEMOGLOBIN: 12.3 g/dL (ref 12.0–15.0)
MCH: 27.6 pg (ref 26.0–34.0)
MCHC: 32.9 g/dL (ref 30.0–36.0)
MCV: 83.9 fL (ref 78.0–100.0)
PLATELETS: 267 10*3/uL (ref 150–400)
RBC: 4.46 MIL/uL (ref 3.87–5.11)
RDW: 13.5 % (ref 11.5–15.5)
WBC: 13.2 10*3/uL — ABNORMAL HIGH (ref 4.0–10.5)

## 2016-10-10 LAB — URINALYSIS, ROUTINE W REFLEX MICROSCOPIC
BILIRUBIN URINE: NEGATIVE
GLUCOSE, UA: NEGATIVE mg/dL
KETONES UR: 5 mg/dL — AB
Nitrite: NEGATIVE
PH: 6 (ref 5.0–8.0)
Protein, ur: 30 mg/dL — AB
Specific Gravity, Urine: 1.012 (ref 1.005–1.030)

## 2016-10-10 LAB — I-STAT CG4 LACTIC ACID, ED
Lactic Acid, Venous: 0.85 mmol/L (ref 0.5–1.9)
Lactic Acid, Venous: 1.38 mmol/L (ref 0.5–1.9)

## 2016-10-10 LAB — LIPASE, BLOOD: Lipase: 17 U/L (ref 11–51)

## 2016-10-10 LAB — I-STAT BETA HCG BLOOD, ED (MC, WL, AP ONLY): I-stat hCG, quantitative: <5 m[IU]/mL (ref <5)

## 2016-10-10 MED ORDER — CEFTRIAXONE SODIUM 1 G IJ SOLR
1.0000 g | Freq: Once | INTRAMUSCULAR | Status: AC
  Administered 2016-10-10: 1 g via INTRAVENOUS
  Filled 2016-10-10: qty 10

## 2016-10-10 MED ORDER — OXYCODONE-ACETAMINOPHEN 5-325 MG PO TABS: 2.0000 | ORAL_TABLET | Freq: Four times a day (QID) | ORAL | 0 refills | Status: AC | PRN

## 2016-10-10 MED ORDER — SODIUM CHLORIDE 0.9 % IV BOLUS (SEPSIS)
1000.0000 mL | Freq: Once | INTRAVENOUS | Status: AC
  Administered 2016-10-10: 1000 mL via INTRAVENOUS

## 2016-10-10 MED ORDER — MORPHINE SULFATE (PF) 4 MG/ML IV SOLN
4.0000 mg | Freq: Once | INTRAVENOUS | Status: AC
  Administered 2016-10-10: 4 mg via INTRAVENOUS
  Filled 2016-10-10: qty 1

## 2016-10-10 MED ORDER — KETOROLAC TROMETHAMINE 30 MG/ML IJ SOLN
30.0000 mg | Freq: Once | INTRAMUSCULAR | Status: AC
  Administered 2016-10-10: 30 mg via INTRAVENOUS
  Filled 2016-10-10: qty 1

## 2016-10-10 MED ORDER — CIPROFLOXACIN HCL 500 MG PO TABS: 500.0000 mg | ORAL_TABLET | Freq: Two times a day (BID) | ORAL | 0 refills | Status: AC

## 2016-10-10 MED ORDER — ACETAMINOPHEN 325 MG PO TABS
ORAL_TABLET | ORAL | Status: AC
  Filled 2016-10-10: qty 2

## 2016-10-10 MED ORDER — ACETAMINOPHEN 325 MG PO TABS
650.0000 mg | ORAL_TABLET | Freq: Once | ORAL | Status: AC
  Administered 2016-10-10: 650 mg via ORAL

## 2016-10-10 MED ORDER — OXYCODONE-ACETAMINOPHEN 5-325 MG PO TABS
1.0000 | ORAL_TABLET | Freq: Once | ORAL | Status: AC
  Administered 2016-10-10: 1 via ORAL
  Filled 2016-10-10: qty 1

## 2016-10-10 NOTE — ED Provider Notes (Signed)
[facility] DEPT Provider Note   CSN: [id number] Arrival date & time: 10/10/16  1359     History   Chief Complaint Chief Complaint  Patient presents with  . Abdominal Pain    HPI CALEIGHA ZALE is a 31 y.o. female.  The history is provided by the patient.  Flank Pain  This is a new problem. The current episode started 2 days ago. The problem occurs constantly. The problem has not changed since onset.Associated symptoms include abdominal pain. Pertinent negatives include no chest pain, no headaches and no shortness of breath. Nothing aggravates the symptoms. Nothing relieves the symptoms. She has tried nothing for the symptoms.    Past Medical History:  Diagnosis Date  . Arthritis   . Headache   . Migraine   . No pertinent past medical history   . Trichomonas     There are no active problems to display for this patient.   Past Surgical History:  Procedure Laterality Date  . DILATION AND CURETTAGE OF UTERUS      OB History    Gravida Para Term Preterm AB Living   5 3 3   2 3    SAB TAB Ectopic Multiple Live Births     2             Home Medications    Prior to Admission medications   Medication Sig Start Date End Date Taking? Authorizing Provider  fluticasone (FLONASE) 50 MCG/ACT nasal spray Place 2 sprays into both nostrils daily. 10/04/15  Yes Waynetta Pean, PA-C    Family History Family History  Problem Relation Age of Onset  . Migraines Father 8  . Hypertension Brother   . Hypertension Maternal Aunt   . Hypertension Maternal Uncle   . Hypertension Maternal Grandmother     Social History Social History  Substance Use Topics  . Smoking status: Current Some Day Smoker    Packs/day: 1.00    Years: 3.00    Types: Cigarettes  . Smokeless tobacco: Never Used  . Alcohol use No     Allergies   Patient has no known allergies.   Review of Systems Review of Systems  Constitutional: Positive for appetite change and fever. Negative for  chills.  HENT: Negative for ear pain and sore throat.   Eyes: Negative for pain and visual disturbance.  Respiratory: Negative for cough and shortness of breath.   Cardiovascular: Negative for chest pain and palpitations.  Gastrointestinal: Positive for abdominal pain. Negative for vomiting.  Genitourinary: Positive for flank pain. Negative for dysuria and hematuria.  Musculoskeletal: Negative for arthralgias and back pain.  Skin: Negative for color change and rash.  Neurological: Negative for seizures, syncope and headaches.  All other systems reviewed and are negative.  Physical Exam Updated Vital Signs BP 110/72   Pulse 97   Temp (S) 98.4 F (36.9 C) (Oral)   Resp 18   LMP 10/07/2016   SpO2 100%   Physical Exam  Constitutional: She is oriented to person, place, and time. She appears well-developed and well-nourished. No distress.  HENT:  Head: Normocephalic and atraumatic.  Eyes: Conjunctivae are normal.  Neck: Neck supple.  Cardiovascular: Normal rate and regular rhythm.   No murmur heard. Pulmonary/Chest: Effort normal and breath sounds normal. No respiratory distress.  Abdominal: Soft. There is tenderness (TTP in small area of left mid-addomen).  Musculoskeletal: She exhibits no edema.  TTP in left flank w/CVAT  Neurological: She is alert and oriented to person, place, and time.  Skin: Skin is warm and dry.  Psychiatric: She has a normal mood and affect.  Nursing note and vitals reviewed.  ED Treatments / Results  Labs (all labs ordered are listed, but only abnormal results are displayed) Labs Reviewed  COMPREHENSIVE METABOLIC PANEL - Abnormal; Notable for the following:       Result Value   Sodium 128 (*)    Potassium 3.3 (*)    Chloride 97 (*)    Glucose, Bld 109 (*)    BUN 5 (*)    Calcium 8.8 (*)    Albumin 3.2 (*)    ALT 10 (*)    All other components within normal limits  CBC - Abnormal; Notable for the following:    WBC 13.2 (*)    All other  components within normal limits  URINALYSIS, ROUTINE W REFLEX MICROSCOPIC - Abnormal; Notable for the following:    APPearance HAZY (*)    Hgb urine dipstick SMALL (*)    Ketones, ur 5 (*)    Protein, ur 30 (*)    Leukocytes, UA MODERATE (*)    Bacteria, UA MANY (*)    Squamous Epithelial / LPF 0-5 (*)    All other components within normal limits  LIPASE, BLOOD  I-STAT BETA HCG BLOOD, ED ([facility], [facility], [facility] ONLY)  I-STAT CG4 LACTIC ACID, ED  I-STAT CG4 LACTIC ACID, ED    EKG  EKG Interpretation None       Radiology No results found.  Procedures Procedures (including critical care time)  Medications Ordered in ED Medications  acetaminophen (TYLENOL) 325 MG tablet (not administered)  acetaminophen (TYLENOL) tablet 650 mg (650 mg Oral Given 10/10/16 1416)     Initial Impression / Assessment and Plan / ED Course  I have reviewed the triage vital signs and the nursing notes.  Pertinent labs & imaging results that were available during my care of the patient were reviewed by me and considered in my medical decision making (see chart for details).    Pt presents with left flank pain. Says the pain started 2days ago & has been constant w/o worsening. Also endorses anorexia & fevers at home. Denies HA, lightheadedness, cough, CP, SOB, N/V/D, urinary symptoms, sick contacts, recent illness, or suspicious food intake. LMP 3days ago.  VS & exam as above. Labs remarkable for WBC 13.2, Na 128, K 3.3, Cl 97; NS bolus & analgesia given. Concern for pyelo vs infected stone; UA pending.  UA w/many bacteria, TNTC WBC, & small Hgb but no nitrites. CT A&P w/non-obstructive right renal stone & perinephric stranding on the left consistent w/early pyelonephritis. Rocephin given in the ED.  Explained all results to the Pt. Will discharge the Pt home with prescription for ciprofloxacin & Percocet. Recommending follow-up with PCP. ED return precautions provided. Pt acknowledged understanding of, and  concurrence with the plan. All questions answered to her satisfaction. In stable condition at the time of discharge.  Final Clinical Impressions(s) / ED Diagnoses   Final diagnoses:  Pyelonephritis    New Prescriptions New Prescriptions   CIPROFLOXACIN (CIPRO) 500 MG TABLET    Take 1 tablet (500 mg total) by mouth every 12 (twelve) hours.   OXYCODONE-ACETAMINOPHEN (PERCOCET/ROXICET) 5-325 MG TABLET    Take 2 tablets by mouth every 6 (six) hours as needed for severe pain.     Jenny Reichmann, MD 10/10/16 2054    Rex Kras Wenda Overland, MD 10/13/16 [phone]

## 2016-10-10 NOTE — ED Notes (Signed)
Patient transported to CT 

## 2016-10-10 NOTE — ED Notes (Signed)
Followed up with CT regarding scan, per CT approx 6 pts to be transported before pt taken to CT.

## 2016-10-10 NOTE — ED Notes (Signed)
This nurse explained to pt that Dr. Rex Kras would like to reassess her before leaving. Pt seen by this nurse walking down the hallway with her d/c papers with a friend. This nurse explained why EDP would like to re assess pt but pt states "I just want to go home now. I'm not waiting for her." Pt encouraged to stay but ultimately decided to leave. This nurse advised pt to take Tylenol at home for her fever. Pt with steady gait.

## 2016-10-10 NOTE — ED Notes (Signed)
Dr. Rex Kras aware of pt temperature, pt still reports she is in a lot of pain. Per Dr. Rex Kras, will reevaluate pt.

## 2016-10-10 NOTE — ED Notes (Signed)
Pt placed on bedside commode.  

## 2016-10-10 NOTE — ED Notes (Signed)
ED Provider at bedside. 

## 2016-10-10 NOTE — ED Triage Notes (Signed)
Pt reports left side abd pain x 2 days. Increases with respirations, denies cough. Reports possible fever. Denies n/v/d or urinary symptoms.

## 2016-12-25 ENCOUNTER — Ambulatory Visit: Payer: Self-pay | Admitting: Internal Medicine

## 2018-01-24 ENCOUNTER — Emergency Department (HOSPITAL_COMMUNITY)
Admission: EM | Admit: 2018-01-24 | Discharge: 2018-01-24 | Disposition: A | Discharge status: Home / Self Care | Payer: Medicaid Other | Attending: Emergency Medicine | Admitting: Emergency Medicine

## 2018-01-24 ENCOUNTER — Encounter (HOSPITAL_COMMUNITY): Payer: Self-pay

## 2018-01-24 DIAGNOSIS — Z79899 Other long term (current) drug therapy: Secondary | ICD-10-CM | POA: Diagnosis not present

## 2018-01-24 DIAGNOSIS — F17210 Nicotine dependence, cigarettes, uncomplicated: Secondary | ICD-10-CM | POA: Insufficient documentation

## 2018-01-24 DIAGNOSIS — J020 Streptococcal pharyngitis: Secondary | ICD-10-CM | POA: Insufficient documentation

## 2018-01-24 DIAGNOSIS — J029 Acute pharyngitis, unspecified: Secondary | ICD-10-CM | POA: Diagnosis present

## 2018-01-24 LAB — GROUP A STREP BY PCR: GROUP A STREP BY PCR: DETECTED — AB

## 2018-01-24 MED ORDER — OXYCODONE-ACETAMINOPHEN 5-325 MG PO TABS
1.0000 | ORAL_TABLET | ORAL | Status: DC | PRN
  Administered 2018-01-24: 1 via ORAL
  Filled 2018-01-24: qty 1

## 2018-01-24 MED ORDER — AMOXICILLIN 500 MG PO CAPS
500.0000 mg | ORAL_CAPSULE | Freq: Once | ORAL | Status: AC
  Administered 2018-01-24: 500 mg via ORAL
  Filled 2018-01-24: qty 1

## 2018-01-24 MED ORDER — LIDOCAINE VISCOUS HCL 2 % MT SOLN: 15.0000 mL | OROMUCOSAL | 0 refills | Status: AC | PRN

## 2018-01-24 MED ORDER — AMOXICILLIN 500 MG PO CAPS: 500.0000 mg | ORAL_CAPSULE | Freq: Three times a day (TID) | ORAL | 0 refills | Status: DC

## 2018-01-24 MED ORDER — DEXAMETHASONE SODIUM PHOSPHATE 4 MG/ML IJ SOLN
8.0000 mg | Freq: Once | INTRAMUSCULAR | Status: AC
  Administered 2018-01-24: 8 mg via INTRAMUSCULAR
  Filled 2018-01-24: qty 2

## 2018-01-24 NOTE — ED Provider Notes (Signed)
[facility] EMERGENCY DEPARTMENT Provider Note   CSN: [id number] Arrival date & time: 01/24/18  1244     History   Chief Complaint Chief Complaint  Patient presents with  . Sore Throat    HPI Ashley Spencer is a 32 y.o. female who presents for evaluation of  a 1 day history of sore throat. Pain is started around 7  pm last night when she got off of work and noticed it hurt to swallow her dinner. Pain does not radiate. Pain is described as a "scratchy feeling." No alleviating factors. Admits her pain is worse with swallowing. Denies recent sick contact, fever, chills, cough, chest pain, SOB, neck pain or stiffness, rhinorrhea. Able to tolerate po liquids.  HPI  Past Medical History:  Diagnosis Date  . Arthritis   . Headache   . Migraine   . No pertinent past medical history   . Trichomonas     There are no active problems to display for this patient.   Past Surgical History:  Procedure Laterality Date  . DILATION AND CURETTAGE OF UTERUS       OB History    Gravida  5   Para  3   Term  3   Preterm      AB  2   Living  3     SAB      TAB  2   Ectopic      Multiple      Live Births               Home Medications    Prior to Admission medications   Medication Sig Start Date End Date Taking? Authorizing Provider  amoxicillin (AMOXIL) 500 MG capsule Take 1 capsule (500 mg total) by mouth 3 (three) times daily. 01/24/18   Luisfelipe Engelstad A, PA-C  fluticasone (FLONASE) 50 MCG/ACT nasal spray Place 2 sprays into both nostrils daily. 10/04/15   Waynetta Pean, PA-C  lidocaine (XYLOCAINE) 2 % solution Use as directed 15 mLs in the mouth or throat every 4 (four) hours as needed for up to 3 days for mouth pain. 01/24/18 01/27/18  Sarayah Bacchi A, PA-C    Family History Family History  Problem Relation Age of Onset  . Migraines Father 91  . Hypertension Brother   . Hypertension Maternal Aunt   . Hypertension Maternal Uncle   .  Hypertension Maternal Grandmother     Social History Social History   Tobacco Use  . Smoking status: Current Some Day Smoker    Packs/day: 1.00    Years: 3.00    Pack years: 3.00    Types: Cigarettes  . Smokeless tobacco: Never Used  Substance Use Topics  . Alcohol use: No  . Drug use: No     Allergies   Patient has no known allergies.   Review of Systems Review of Systems  HENT: Positive for sore throat. Negative for congestion, drooling, ear discharge, ear pain, facial swelling, postnasal drip, rhinorrhea, sinus pressure, sinus pain, trouble swallowing and voice change.   Respiratory: Negative.   Cardiovascular: Negative.   Musculoskeletal: Negative for neck pain and neck stiffness.  Skin: Negative.   Neurological: Negative.   All other systems reviewed and are negative.    Physical Exam Updated Vital Signs BP 123/90 (BP Location: Right Arm)   Pulse 75   Temp 98.5 F (36.9 C) (Oral)   Resp 16   LMP 01/17/2018 (Approximate)   SpO2 100%   Physical  Exam  Constitutional: She appears well-developed and well-nourished.  Non-toxic appearance. She does not appear ill. No distress.  HENT:  Head: Normocephalic and atraumatic.  Right Ear: Hearing, tympanic membrane and ear canal normal. No drainage, swelling or tenderness. No middle ear effusion.  Left Ear: Hearing, tympanic membrane and ear canal normal. There is drainage. No swelling or tenderness.  No middle ear effusion.  Mouth/Throat: Uvula is midline and mucous membranes are normal. She does not have dentures. No oral lesions. No trismus in the jaw. Dental caries present. No dental abscesses or uvula swelling. Oropharyngeal exudate present. No posterior oropharyngeal edema, posterior oropharyngeal erythema or tonsillar abscesses. Tonsils are 2+ on the right. Tonsils are 2+ on the left. Tonsillar exudate.  Uvula midline, no elevation of the palate, No evidence of abscess. No facial swelling or sublingual/ submandibular  swelling.    Eyes: Pupils are equal, round, and reactive to light.  Neck: Trachea normal, normal range of motion, full passive range of motion without pain and phonation normal. Neck supple. No tracheal tenderness and no muscular tenderness present. No neck rigidity. No tracheal deviation, no edema, no erythema and normal range of motion present. No thyromegaly present.  Cardiovascular: Normal rate, regular rhythm, normal heart sounds and intact distal pulses.  Pulmonary/Chest: Effort normal and breath sounds normal. No accessory muscle usage or stridor. No respiratory distress. She has no wheezes. She has no rhonchi. She has no rales.  Abdominal: Soft. Bowel sounds are normal. She exhibits no distension.  Musculoskeletal: Normal range of motion.  Lymphadenopathy:    She has no cervical adenopathy.  Neurological: She is alert.  Skin: Skin is warm and dry. No rash noted. No erythema.  Psychiatric: She has a normal mood and affect.  Nursing note and vitals reviewed.    ED Treatments / Results  Labs (all labs ordered are listed, but only abnormal results are displayed) Labs Reviewed  GROUP A STREP BY PCR - Abnormal; Notable for the following components:      Result Value   Group A Strep by PCR DETECTED (*)    All other components within normal limits    EKG None  Radiology No results found.  Procedures Procedures (including critical care time)  Medications Ordered in ED Medications  oxyCODONE-acetaminophen (PERCOCET/ROXICET) 5-325 MG per tablet 1 tablet (1 tablet Oral Given 01/24/18 1256)  dexamethasone (DECADRON) injection 8 mg (8 mg Intramuscular Given 01/24/18 1404)  amoxicillin (AMOXIL) capsule 500 mg (500 mg Oral Given 01/24/18 1410)     Initial Impression / Assessment and Plan / ED Course  I have reviewed the triage vital signs and the nursing notes as well as the past medical history.  Pertinent labs & imaging results that were available during my care of the patient  were reviewed by me and considered in my medical decision making (see chart for details).  Patient presents for evaluation of sore throat x1 day. Rapid strep detected. Able to tolerate oral secretions and tolerate PO liquid intake in the ED. Pt afebrile with tonsillar exudate, no cervical lymphadenopathy. Treated in the ED with steroids, pain medication and dose of antibiotic.  Discussed importance of water rehydration. Presentation non concerning for PTA or RPA. No trismus or uvula deviation.  Pt able to drink water in ED and tolerate oral amoxacillin without difficulty with intact air way. Will d/c with Amoxacillin prescription and viscous lidocaine solution. Return precautions discussed. Recommended PCP follow up. Patient voiced understanding.    Final Clinical Impressions(s) / ED Diagnoses  Final diagnoses:  Strep pharyngitis    ED Discharge Orders         Ordered    amoxicillin (AMOXIL) 500 MG capsule  3 times daily     01/24/18 1355    lidocaine (XYLOCAINE) 2 % solution  Every 4 hours PRN     01/24/18 1355           Taariq Leitz A, PA-C 01/24/18 1425    Fredia Sorrow, MD 01/29/18 1628

## 2018-01-24 NOTE — Discharge Instructions (Addendum)
Your strep test today was positive. You have been prescribed Amoxicillin as an antibiotic. Please take as prescribed and complete the course of the antibiotic. I have also prescribe lidocaine solution for you to gargle with. You may also use warm salt water gargle. Please return for any new or concerning symptoms.   Get help right away if: You throw up (vomit). You get a very bad headache. You neck hurts or it feels stiff. You have chest pain or you are short of breath. You have drooling, very bad throat pain, or changes in your voice. Your neck is swollen or the skin gets red and tender. Your mouth is dry or you are peeing less than normal. You keep feeling more tired or it is hard to wake up. Your joints are red or they hurt.

## 2018-01-24 NOTE — ED Triage Notes (Signed)
Pt presents for evaluation of sore throat x 2 days. States very painful to swallow. Sent from [facility]. Negative strep test at [facility] per patient.

## 2018-10-21 ENCOUNTER — Ambulatory Visit: Payer: Medicaid Other | Attending: Internal Medicine | Admitting: Internal Medicine

## 2018-10-21 ENCOUNTER — Other Ambulatory Visit: Payer: Self-pay

## 2018-10-21 ENCOUNTER — Encounter: Payer: Self-pay | Admitting: Internal Medicine

## 2018-10-21 VITALS — BP 124/76 | HR 79 | Temp 98.2°F | Resp 16 | Ht 60.0 in | Wt 114.0 lb

## 2018-10-21 DIAGNOSIS — Z3202 Encounter for pregnancy test, result negative: Secondary | ICD-10-CM | POA: Diagnosis not present

## 2018-10-21 DIAGNOSIS — F17210 Nicotine dependence, cigarettes, uncomplicated: Secondary | ICD-10-CM | POA: Diagnosis not present

## 2018-10-21 DIAGNOSIS — N911 Secondary amenorrhea: Secondary | ICD-10-CM | POA: Insufficient documentation

## 2018-10-21 DIAGNOSIS — Z3042 Encounter for surveillance of injectable contraceptive: Secondary | ICD-10-CM

## 2018-10-21 DIAGNOSIS — Z3009 Encounter for other general counseling and advice on contraception: Secondary | ICD-10-CM

## 2018-10-21 DIAGNOSIS — F17200 Nicotine dependence, unspecified, uncomplicated: Secondary | ICD-10-CM

## 2018-10-21 LAB — POCT URINE PREGNANCY: Preg Test, Ur: NEGATIVE

## 2018-10-21 MED ORDER — MEDROXYPROGESTERONE ACETATE 150 MG/ML IM SUSP
150.0000 mg | Freq: Once | INTRAMUSCULAR | Status: AC
  Administered 2018-10-21: 150 mg via INTRAMUSCULAR

## 2018-10-21 NOTE — Patient Instructions (Addendum)
Please schedule a nurse visit for your next depo between July 28-August 11   Medroxyprogesterone injection [Contraceptive] What is this medicine? MEDROXYPROGESTERONE (me DROX ee proe JES te rone) contraceptive injections prevent pregnancy. They provide effective birth control for 3 months. Depo-subQ Provera 104 is also used for treating pain related to endometriosis. This medicine may be used for other purposes; ask your health care provider or pharmacist if you have questions. COMMON BRAND NAME(S): Depo-Provera, Depo-subQ Provera 104 What should I tell my health care provider before I take this medicine? They need to know if you have any of these conditions: -frequently drink alcohol -asthma -blood vessel disease or a history of a blood clot in the lungs or legs -bone disease such as osteoporosis -breast cancer -diabetes -eating disorder (anorexia nervosa or bulimia) -high blood pressure -HIV infection or AIDS -kidney disease -liver disease -mental depression -migraine -seizures (convulsions) -stroke -tobacco smoker -vaginal bleeding -an unusual or allergic reaction to medroxyprogesterone, other hormones, medicines, foods, dyes, or preservatives -pregnant or trying to get pregnant -breast-feeding How should I use this medicine? Depo-Provera Contraceptive injection is given into a muscle. Depo-subQ Provera 104 injection is given under the skin. These injections are given by a health care professional. You must not be pregnant before getting an injection. The injection is usually given during the first 5 days after the start of a menstrual period or 6 weeks after delivery of a baby. Talk to your pediatrician regarding the use of this medicine in children. Special care may be needed. These injections have been used in female children who have started having menstrual periods. Overdosage: If you think you have taken too much of this medicine contact a poison control center or emergency  room at once. NOTE: This medicine is only for you. Do not share this medicine with others. What if I miss a dose? Try not to miss a dose. You must get an injection once every 3 months to maintain birth control. If you cannot keep an appointment, call and reschedule it. If you wait longer than 13 weeks between Depo-Provera contraceptive injections or longer than 14 weeks between Depo-subQ Provera 104 injections, you could get pregnant. Use another method for birth control if you miss your appointment. You may also need a pregnancy test before receiving another injection. What may interact with this medicine? Do not take this medicine with any of the following medications: -bosentan This medicine may also interact with the following medications: -aminoglutethimide -antibiotics or medicines for infections, especially rifampin, rifabutin, rifapentine, and griseofulvin -aprepitant -barbiturate medicines such as phenobarbital or primidone -bexarotene -carbamazepine -medicines for seizures like ethotoin, felbamate, oxcarbazepine, phenytoin, topiramate -modafinil -St. John's wort This list may not describe all possible interactions. Give your health care provider a list of all the medicines, herbs, non-prescription drugs, or dietary supplements you use. Also tell them if you smoke, drink alcohol, or use illegal drugs. Some items may interact with your medicine. What should I watch for while using this medicine? This drug does not protect you against HIV infection (AIDS) or other sexually transmitted diseases. Use of this product may cause you to lose calcium from your bones. Loss of calcium may cause weak bones (osteoporosis). Only use this product for more than 2 years if other forms of birth control are not right for you. The longer you use this product for birth control the more likely you will be at risk for weak bones. Ask your health care professional how you can keep strong bones. You  may have a  change in bleeding pattern or irregular periods. Many females stop having periods while taking this drug. If you have received your injections on time, your chance of being pregnant is very low. If you think you may be pregnant, see your health care professional as soon as possible. Tell your health care professional if you want to get pregnant within the next year. The effect of this medicine may last a long time after you get your last injection. What side effects may I notice from receiving this medicine? Side effects that you should report to your doctor or health care professional as soon as possible: -allergic reactions like skin rash, itching or hives, swelling of the face, lips, or tongue -breast tenderness or discharge -breathing problems -changes in vision -depression -feeling faint or lightheaded, falls -fever -pain in the abdomen, chest, groin, or leg -problems with balance, talking, walking -unusually weak or tired -yellowing of the eyes or skin Side effects that usually do not require medical attention (report to your doctor or health care professional if they continue or are bothersome): -acne -fluid retention and swelling -headache -irregular periods, spotting, or absent periods -temporary pain, itching, or skin reaction at site where injected -weight gain This list may not describe all possible side effects. Call your doctor for medical advice about side effects. You may report side effects to FDA at 1-800-FDA-1088. Where should I keep my medicine? This does not apply. The injection will be given to you by a health care professional. NOTE: This sheet is a summary. It may not cover all possible information. If you have questions about this medicine, talk to your doctor, pharmacist, or health care provider.  2019 Elsevier/Gold Standard (2008-06-18 18:37:56)   Smoking Tobacco Information, Adult Smoking tobacco can be harmful to your health. Tobacco contains a poisonous  (toxic), colorless chemical called nicotine. Nicotine is addictive. It changes the brain and can make it hard to stop smoking. Tobacco also has other toxic chemicals that can hurt your body and raise your risk of many cancers. How can smoking tobacco affect me? Smoking tobacco puts you at risk for:  Cancer. Smoking is most commonly associated with lung cancer, but can also lead to cancer in other parts of the body.  Chronic obstructive pulmonary disease (COPD). This is a long-term lung condition that makes it hard to breathe. It also gets worse over time.  High blood pressure (hypertension), heart disease, stroke, or heart attack.  Lung infections, such as pneumonia.  Cataracts. This is when the lenses in the eyes become clouded.  Digestive problems. This may include peptic ulcers, heartburn, and gastroesophageal reflux disease (GERD).  Oral health problems, such as gum disease and tooth loss.  Loss of taste and smell. Smoking can affect your appearance by causing:  Wrinkles.  Yellow or stained teeth, fingers, and fingernails. Smoking tobacco can also affect your social life, because:  It may be challenging to find places to smoke when away from home. Many workplaces, [employer], hotels, and public places are tobacco-free.  Smoking is expensive. This is due to the cost of tobacco and the long-term costs of treating health problems from smoking.  Secondhand smoke may affect those around you. Secondhand smoke can cause lung cancer, breathing problems, and heart disease. Children of smokers have a higher risk for: ? Sudden infant death syndrome (SIDS). ? Ear infections. ? Lung infections. If you currently smoke tobacco, quitting now can help you:  Lead a longer and healthier life.  Look,  smell, breathe, and feel better over time.  Save money.  Protect others from the harms of secondhand smoke. What actions can I take to prevent health problems? Quit smoking   Do not start  smoking. Quit if you already do.  Make a plan to quit smoking and commit to it. Look for programs to help you and ask your health care provider for recommendations and ideas.  Set a date and write down all the reasons you want to quit.  Let your friends and family know you are quitting so they can help and support you. Consider finding friends who also want to quit. It can be easier to quit with someone else, so that you can support each other.  Talk with your health care provider about using nicotine replacement medicines to help you quit, such as gum, lozenges, patches, sprays, or pills.  Do not replace cigarette smoking with electronic cigarettes, which are commonly called e-cigarettes. The safety of e-cigarettes is not known, and some may contain harmful chemicals.  If you try to quit but return to smoking, stay positive. It is common to slip up when you first quit, so take it one day at a time.  Be prepared for cravings. When you feel the urge to smoke, chew gum or suck on hard candy. Lifestyle  Stay busy and take care of your body.  Drink enough fluid to keep your urine pale yellow.  Get plenty of exercise and eat a healthy diet. This can help prevent weight gain after quitting.  Monitor your eating habits. Quitting smoking can cause you to have a larger appetite than when you smoke.  Find ways to relax. Go out with friends or family to a movie or a restaurant where people do not smoke.  Ask your health care provider about having regular tests (screenings) to check for cancer. This may include blood tests, imaging tests, and other tests.  Find ways to manage your stress, such as meditation, yoga, or exercise. Where to find support To get support to quit smoking, consider:  Asking your health care provider for more information and resources.  Taking classes to learn more about quitting smoking.  Looking for local organizations that offer resources about quitting  smoking.  Joining a support group for people who want to quit smoking in your local community.  Calling the smokefree.gov counselor helpline: 1-800-Quit-Now 256-258-2989) Where to find more information You may find more information about quitting smoking from:  HelpGuide.org: www.helpguide.org  https://hall.com/: smokefree.gov  American Lung Association: www.lung.org Contact a health care provider if you:  Have problems breathing.  Notice that your lips, nose, or fingers turn blue.  Have chest pain.  Are coughing up blood.  Feel faint or you pass out.  Have other health changes that cause you to worry. Summary  Smoking tobacco can negatively affect your health, the health of those around you, your finances, and your social life.  Do not start smoking. Quit if you already do. If you need help quitting, ask your health care provider.  Think about joining a support group for people who want to quit smoking in your local community. There are many effective programs that will help you to quit this behavior. This information is not intended to replace advice given to you by your health care provider. Make sure you discuss any questions you have with your health care provider. Document Released: 06/12/2016 Document Revised: 07/17/2017 Document Reviewed: 06/12/2016 Elsevier Interactive Patient Education  2019 [employer].

## 2018-10-21 NOTE — Progress Notes (Signed)
Patient ID: Ashley Spencer, female    DOB: 23-Mar-1986  MRN: [id number]  CC: New Patient (Initial Visit)   Subjective: Ashley Spencer is a 33 y.o. female who presents for new pt visit Her concerns today include:   Pt requesting pregnancy test LNMP was 08/14/2018.  Last sexual activity was 3 wks ago.  Patient states she did a home pregnancy test last week and thinks it was positive.  She has not had any nausea or breast tenderness. Pt is G5P3 (2 abortions). Not wanting pregnancy I would like to be put on some method of birth control.  Was on Depo in past and did well on it without unpredictable bleeding or wgh gain Smokes 4 cig/day for past 4 yrs.  Attributes smoking to stress.  Tried to quit in past.  Feels she can quit by cutting down on her own No hx of HTN No hx of DV/PE Hx of migraine for which she takes Naprosyn for abortive therapy.  No increase in migraines in past when she was on depo   Social history, family history and surgical histories reviewed. No current outpatient medications on file prior to visit.   No current facility-administered medications on file prior to visit.     No Known Allergies  Social History   Socioeconomic History  . Marital status: Significant Other    Spouse name: Not on file  . Number of children: 3  . Years of education: 11th  . Highest education level: Not on file  Occupational History    Employer: OTHER    Comment: n/a  Social Needs  . Financial resource strain: Not on file  . Food insecurity:    Worry: Not on file    Inability: Not on file  . Transportation needs:    Medical: Not on file    Non-medical: Not on file  Tobacco Use  . Smoking status: Current Some Day Smoker    Packs/day: 1.00    Years: 3.00    Pack years: 3.00    Types: Cigarettes  . Smokeless tobacco: Never Used  Substance and Sexual Activity  . Alcohol use: No  . Drug use: No  . Sexual activity: Yes    Birth control/protection: None  Lifestyle  .  Physical activity:    Days per week: Not on file    Minutes per session: Not on file  . Stress: Not on file  Relationships  . Social connections:    Talks on phone: Not on file    Gets together: Not on file    Attends religious service: Not on file    Active member of club or organization: Not on file    Attends meetings of clubs or organizations: Not on file    Relationship status: Not on file  . Intimate partner violence:    Fear of current or ex partner: Not on file    Emotionally abused: Not on file    Physically abused: Not on file    Forced sexual activity: Not on file  Other Topics Concern  . Not on file  Social History Narrative   Patient lives at home with her grandmother.   Caffeine Use: 1 cup every other morning    Family History  Problem Relation Age of Onset  . Migraines Father 50  . Hypertension Brother   . Hypertension Maternal Aunt   . Hypertension Maternal Uncle   . Hypertension Maternal Grandmother     Past Surgical History:  Procedure Laterality  Date  . DILATION AND CURETTAGE OF UTERUS      ROS: Review of Systems Negative except as stated above  PHYSICAL EXAM: BP 124/76   Pulse 79   Temp 98.2 F (36.8 C) (Oral)   Resp 16   Ht 5' (1.524 m)   Wt 114 lb (51.7 kg)   LMP 08/14/2018   SpO2 100%   BMI 22.26 kg/m   Physical Exam  General appearance - alert, well appearing, young African-American female and in no distress Mental status - normal mood, behavior, speech, dress, motor activity, and thought processes Chest - clear to auscultation, no wheezes, rales or rhonchi, symmetric air entry Heart - normal rate, regular rhythm, normal S1, S2, no murmurs, rubs, clicks or gallops  Results for orders placed or performed in visit on 10/21/18  POCT urine pregnancy  Result Value Ref Range   Preg Test, Ur Negative Negative    CMP Latest Ref Rng & Units 10/10/2016  Glucose 65 - 99 mg/dL 109(H)  BUN 6 - 20 mg/dL 5(L)  Creatinine 0.44 - 1.00 mg/dL  1.00  Sodium 135 - 145 mmol/L 128(L)  Potassium 3.5 - 5.1 mmol/L 3.3(L)  Chloride 101 - 111 mmol/L 97(L)  CO2 22 - 32 mmol/L 23  Calcium 8.9 - 10.3 mg/dL 8.8(L)  Total Protein 6.5 - 8.1 g/dL 7.6  Total Bilirubin 0.3 - 1.2 mg/dL 0.6  Alkaline Phos 38 - 126 U/L 82  AST 15 - 41 U/L 17  ALT 14 - 54 U/L 10(L)   Lipid Panel  No results found for: CHOL, TRIG, HDL, CHOLHDL, VLDL, LDLCALC, LDLDIRECT  CBC    Component Value Date/Time   WBC 13.2 (H) 10/10/2016 1426   RBC 4.46 10/10/2016 1426   HGB 12.3 10/10/2016 1426   HCT 37.4 10/10/2016 1426   PLT 267 10/10/2016 1426   MCV 83.9 10/10/2016 1426   MCH 27.6 10/10/2016 1426   MCHC 32.9 10/10/2016 1426   RDW 13.5 10/10/2016 1426   LYMPHSABS 2.0 03/05/2008 2308   MONOABS 0.8 03/05/2008 2308   EOSABS 0.1 03/05/2008 2308   BASOSABS 0.0 03/05/2008 2308    ASSESSMENT AND PLAN: 1. Secondary amenorrhea 2. Contraceptive education Urine pregnancy test is negative.  We could check hormone levels to work-up for secondary amenorrhea the patient is more interested in getting on some form of reliable birth control.  We discussed birth control methods.  She prefers Depakote which she has been on in the past and did well with.  Patient advised of possible side effects of Depo including weight gain, blood clots and unpredictable and sometimes prolonged vaginal bleeding.  Patient advised that he can also sometimes cause increase in migraines.  Patient still wishes to proceed.  Depo shot given today.  She will follow-up with RN every 3 months - POCT urine pregnancy - medroxyPROGESTERone (DEPO-PROVERA) injection 150 mg  3. Tobacco dependence Strongly advised to quit.  Informed of health risks associated with smoking.  He is ready to give a trial of quitting.  I discussed methods to help her quit.  Patient prefers to cut back on her own and quit.  Encouraged her to set a quit date.  Less than 5 minutes spent on counseling.  Patient was given the opportunity  to ask questions.  Patient verbalized understanding of the plan and was able to repeat key elements of the plan.   Orders Placed This Encounter  Procedures  . POCT urine pregnancy     Requested Prescriptions  No prescriptions requested or ordered in this encounter    No follow-ups on file.  Karle Plumber, MD, FACP

## 2018-10-21 NOTE — Progress Notes (Signed)
Pt states she took a pregnancy test last week and it was positive

## 2019-01-06 ENCOUNTER — Ambulatory Visit: Payer: Medicaid Other | Admitting: Internal Medicine

## 2019-01-09 ENCOUNTER — Ambulatory Visit: Payer: Medicaid Other | Admitting: Internal Medicine

## 2019-01-27 ENCOUNTER — Ambulatory Visit: Payer: Medicaid Other

## 2019-01-29 ENCOUNTER — Ambulatory Visit: Payer: Medicaid Other

## 2019-04-02 ENCOUNTER — Emergency Department (HOSPITAL_COMMUNITY)
Admission: EM | Admit: 2019-04-02 | Discharge: 2019-04-02 | Disposition: A | Discharge status: Home / Self Care | Payer: Medicaid Other | Attending: Emergency Medicine | Admitting: Emergency Medicine

## 2019-04-02 ENCOUNTER — Other Ambulatory Visit: Payer: Self-pay

## 2019-04-02 ENCOUNTER — Emergency Department (HOSPITAL_COMMUNITY): Payer: Medicaid Other

## 2019-04-02 ENCOUNTER — Encounter (HOSPITAL_COMMUNITY): Payer: Self-pay | Admitting: Emergency Medicine

## 2019-04-02 DIAGNOSIS — F17210 Nicotine dependence, cigarettes, uncomplicated: Secondary | ICD-10-CM | POA: Insufficient documentation

## 2019-04-02 DIAGNOSIS — R1032 Left lower quadrant pain: Secondary | ICD-10-CM | POA: Diagnosis present

## 2019-04-02 DIAGNOSIS — N390 Urinary tract infection, site not specified: Secondary | ICD-10-CM | POA: Insufficient documentation

## 2019-04-02 DIAGNOSIS — N200 Calculus of kidney: Secondary | ICD-10-CM | POA: Diagnosis not present

## 2019-04-02 LAB — CBC WITH DIFFERENTIAL/PLATELET
Abs Immature Granulocytes: 0.12 10*3/uL — ABNORMAL HIGH (ref 0.00–0.07)
Basophils Absolute: 0 10*3/uL (ref 0.0–0.1)
Basophils Relative: 0 %
Eosinophils Absolute: 0 10*3/uL (ref 0.0–0.5)
Eosinophils Relative: 0 %
HCT: 40.9 % (ref 36.0–46.0)
Hemoglobin: 13 g/dL (ref 12.0–15.0)
Immature Granulocytes: 1 %
Lymphocytes Relative: 3 %
Lymphs Abs: 0.5 10*3/uL — ABNORMAL LOW (ref 0.7–4.0)
MCH: 27.5 pg (ref 26.0–34.0)
MCHC: 31.8 g/dL (ref 30.0–36.0)
MCV: 86.7 fL (ref 80.0–100.0)
Monocytes Absolute: 0.5 10*3/uL (ref 0.1–1.0)
Monocytes Relative: 3 %
Neutro Abs: 13.4 10*3/uL — ABNORMAL HIGH (ref 1.7–7.7)
Neutrophils Relative %: 93 %
Platelets: 251 10*3/uL (ref 150–400)
RBC: 4.72 MIL/uL (ref 3.87–5.11)
RDW: 13.4 % (ref 11.5–15.5)
WBC: 14.5 10*3/uL — ABNORMAL HIGH (ref 4.0–10.5)
nRBC: 0 % (ref 0.0–0.2)

## 2019-04-02 LAB — URINALYSIS, ROUTINE W REFLEX MICROSCOPIC
Bilirubin Urine: NEGATIVE
Glucose, UA: NEGATIVE mg/dL
Ketones, ur: 20 mg/dL — AB
Nitrite: POSITIVE — AB
Protein, ur: 100 mg/dL — AB
Specific Gravity, Urine: 1.024 (ref 1.005–1.030)
WBC, UA: >50 WBC/hpf — ABNORMAL HIGH (ref 0–5)
pH: 5 (ref 5.0–8.0)

## 2019-04-02 LAB — COMPREHENSIVE METABOLIC PANEL
ALT: 12 U/L (ref 0–44)
AST: 15 U/L (ref 15–41)
Albumin: 4.2 g/dL (ref 3.5–5.0)
Alkaline Phosphatase: 71 U/L (ref 38–126)
Anion gap: 9 (ref 5–15)
BUN: 9 mg/dL (ref 6–20)
CO2: 22 mmol/L (ref 22–32)
Calcium: 9.3 mg/dL (ref 8.9–10.3)
Chloride: 102 mmol/L (ref 98–111)
Creatinine, Ser: 1.07 mg/dL — ABNORMAL HIGH (ref 0.44–1.00)
GFR calc Af Amer: >60 mL/min (ref >60)
GFR calc non Af Amer: >60 mL/min (ref >60)
Glucose, Bld: 151 mg/dL — ABNORMAL HIGH (ref 70–99)
Potassium: 3.6 mmol/L (ref 3.5–5.1)
Sodium: 133 mmol/L — ABNORMAL LOW (ref 135–145)
Total Bilirubin: 0.5 mg/dL (ref 0.3–1.2)
Total Protein: 7.9 g/dL (ref 6.5–8.1)

## 2019-04-02 LAB — HCG, QUANTITATIVE, PREGNANCY: hCG, Beta Chain, Quant, S: <1 m[IU]/mL (ref <5)

## 2019-04-02 LAB — PREGNANCY, URINE: Preg Test, Ur: NEGATIVE

## 2019-04-02 MED ORDER — ONDANSETRON 4 MG PO TBDP: 4.0000 mg | ORAL_TABLET | Freq: Three times a day (TID) | ORAL | 0 refills | Status: DC | PRN

## 2019-04-02 MED ORDER — OXYCODONE-ACETAMINOPHEN 5-325 MG PO TABS: 1.0000 | ORAL_TABLET | ORAL | 0 refills | Status: DC | PRN

## 2019-04-02 MED ORDER — CEPHALEXIN 500 MG PO CAPS: 500.0000 mg | ORAL_CAPSULE | Freq: Four times a day (QID) | ORAL | 0 refills | Status: DC

## 2019-04-02 MED ORDER — KETOROLAC TROMETHAMINE 30 MG/ML IJ SOLN
30.0000 mg | Freq: Once | INTRAMUSCULAR | Status: AC
  Administered 2019-04-02: 14:00:00 30 mg via INTRAVENOUS
  Filled 2019-04-02: qty 1

## 2019-04-02 MED ORDER — TAMSULOSIN HCL 0.4 MG PO CAPS: 0.4000 mg | ORAL_CAPSULE | Freq: Every day | ORAL | 0 refills | Status: DC

## 2019-04-02 MED ORDER — ONDANSETRON HCL 4 MG/2ML IJ SOLN
4.0000 mg | Freq: Once | INTRAMUSCULAR | Status: AC
  Administered 2019-04-02: 4 mg via INTRAVENOUS
  Filled 2019-04-02: qty 2

## 2019-04-02 MED ORDER — HYDROMORPHONE HCL 1 MG/ML IJ SOLN
0.5000 mg | Freq: Once | INTRAMUSCULAR | Status: AC
  Administered 2019-04-02: 10:00:00 0.5 mg via INTRAVENOUS
  Filled 2019-04-02: qty 1

## 2019-04-02 NOTE — ED Provider Notes (Signed)
[facility] DEPT Provider Note   CSN: [id number] Arrival date & time: 04/02/19  [id number]     History   Chief Complaint Chief Complaint  Patient presents with  . Flank Pain  . Emesis    HPI Ashley Spencer is a 33 y.o. female.     The history is provided by the patient. No language interpreter was used.  Flank Pain This is a new problem. The current episode started 12 to 24 hours ago. The problem occurs constantly. The problem has been gradually worsening. Pertinent negatives include no abdominal pain. Nothing aggravates the symptoms. Nothing relieves the symptoms. She has tried nothing for the symptoms. The treatment provided no relief.  Emesis Associated symptoms: no abdominal pain   Pt reports she has severe pain in her right upper back.  Pt reports she had a kidney stone in the past.   Past Medical History:  Diagnosis Date  . Arthritis   . Headache   . Migraine   . No pertinent past medical history   . Trichomonas     There are no active problems to display for this patient.   Past Surgical History:  Procedure Laterality Date  . DILATION AND CURETTAGE OF UTERUS       OB History    Gravida  5   Para  3   Term  3   Preterm      AB  2   Living  3     SAB      TAB  2   Ectopic      Multiple      Live Births               Home Medications    Prior to Admission medications   Medication Sig Start Date End Date Taking? Authorizing Provider  ibuprofen (ADVIL) 200 MG tablet Take 400 mg by mouth every 6 (six) hours as needed for headache or moderate pain.   Yes [provider]    Family History Family History  Problem Relation Age of Onset  . Migraines Father 14  . Hypertension Brother   . Hypertension Maternal Aunt   . Hypertension Maternal Uncle   . Hypertension Maternal Grandmother     Social History Social History   Tobacco Use  . Smoking status: Current Some Day Smoker    Packs/day: 1.00     Years: 3.00    Pack years: 3.00    Types: Cigarettes  . Smokeless tobacco: Never Used  Substance Use Topics  . Alcohol use: No  . Drug use: No     Allergies   Patient has no known allergies.   Review of Systems Review of Systems  Gastrointestinal: Positive for vomiting. Negative for abdominal pain.  Genitourinary: Positive for flank pain.  All other systems reviewed and are negative.    Physical Exam Updated Vital Signs BP 128/78   Pulse 72   Temp 98.3 F (36.8 C)   Resp 16   SpO2 100%   Physical Exam Vitals signs and nursing note reviewed.  Constitutional:      Appearance: She is well-developed.  HENT:     Head: Normocephalic.     Mouth/Throat:     Mouth: Mucous membranes are moist.  Eyes:     Pupils: Pupils are equal, round, and reactive to light.  Neck:     Musculoskeletal: Normal range of motion.  Cardiovascular:     Rate and Rhythm: Normal rate.  Pulmonary:  Effort: Pulmonary effort is normal.  Abdominal:     General: Abdomen is flat. There is no distension.  Musculoskeletal: Normal range of motion.  Skin:    General: Skin is warm.  Neurological:     General: No focal deficit present.     Mental Status: She is alert and oriented to person, place, and time.  Psychiatric:        Mood and Affect: Mood normal.      ED Treatments / Results  Labs (all labs ordered are listed, but only abnormal results are displayed) Labs Reviewed  CBC WITH DIFFERENTIAL/PLATELET - Abnormal; Notable for the following components:      Result Value   WBC 14.5 (*)    Neutro Abs 13.4 (*)    Lymphs Abs 0.5 (*)    Abs Immature Granulocytes 0.12 (*)    All other components within normal limits  COMPREHENSIVE METABOLIC PANEL - Abnormal; Notable for the following components:   Sodium 133 (*)    Glucose, Bld 151 (*)    Creatinine, Ser 1.07 (*)    All other components within normal limits  URINALYSIS, ROUTINE W REFLEX MICROSCOPIC - Abnormal; Notable for the  following components:   APPearance CLOUDY (*)    Hgb urine dipstick MODERATE (*)    Ketones, ur 20 (*)    Protein, ur 100 (*)    Nitrite POSITIVE (*)    Leukocytes,Ua MODERATE (*)    WBC, UA >50 (*)    Bacteria, UA MANY (*)    All other components within normal limits  PREGNANCY, URINE  HCG, QUANTITATIVE, PREGNANCY    EKG None  Radiology Ct Renal Stone Study  Result Date: 04/02/2019 CLINICAL DATA:  Pt c/o flank pain and now abd pain as well C/o right flank pain since yesterday. Vomiting started around 4am today. Denies bowel or urinary problems. No hx of sx No hx of CA EXAM: CT ABDOMEN AND PELVIS WITHOUT CONTRAST TECHNIQUE: Multidetector CT imaging of the abdomen and pelvis was performed following the standard protocol without IV contrast. COMPARISON:  10/10/2016 FINDINGS: Lower chest: No acute abnormality. Hepatobiliary: No focal liver abnormality is seen. No radiopaque gallstones, biliary dilatation, or pericholecystic inflammatory changes. Pancreas: Unremarkable. No pancreatic ductal dilatation or surrounding inflammatory changes. Spleen: Normal in size without focal abnormality. Adrenals/Urinary Tract: Adrenal glands are normal in appearance. There is RIGHT-sided hydronephrosis and RIGHT hydroureter to level of the distal ureter where there is a calculus measuring 3 millimeters. This stone is estimated to be approximately 2-3 centimeters from the ureterovesical junction. The LEFT kidney and ureter are unremarkable. Urinary bladder is normal in appearance. Stomach/Bowel: Stomach is normal in appearance. Small bowel loops are unremarkable. No bowel obstruction. Colon is unremarkable. The appendix is well seen and has a normal appearance. Paucity of intra-abdominal fat limits evaluation of bowel on this noncontrast exam. Vascular/Lymphatic: No significant vascular findings are present. No enlarged abdominal or pelvic lymph nodes. Reproductive: Uterus is present. No adnexal mass. Other: No  abdominal wall hernia or abnormality. No abdominopelvic ascites. Musculoskeletal: No acute or significant osseous findings. IMPRESSION: 1. Obstructing 3 mm calculus in the distal RIGHT ureter associated with hydronephrosis and hydroureter. 2. Normal appendix. 3. No bowel obstruction. Electronically Signed   By: Nolon Nations M.D.   On: 04/02/2019 13:26    Procedures Procedures (including critical care time)  Medications Ordered in ED Medications  HYDROmorphone (DILAUDID) injection 0.5 mg (0.5 mg Intravenous Given 04/02/19 0958)  ondansetron (ZOFRAN) injection 4 mg (4 mg Intravenous  Given 04/02/19 0957)     Initial Impression / Assessment and Plan / ED Course  I have reviewed the triage vital signs and the nursing notes.  Pertinent labs & imaging results that were available during my care of the patient were reviewed by me and considered in my medical decision making (see chart for details).  Clinical Course as of Apr 01 1330  Thu Apr 02, 2019  1302 HCG, Beta Chain, Quant, S: <1 [LS]    Clinical Course User Index [LS] Fransico Meadow, PA-C       MDM  Pt given dilaudid and zofran IV.   CT shows  68mm calculus in the distal right ureter. Ua shows 21-50 rbc's and greater than 50 wbc's.   I will treat pt with keflex, percocet, zofran and flomax.  Pt advised to scheduled to see Urology for recheck.   Final Clinical Impressions(s) / ED Diagnoses   Final diagnoses:  Kidney stone  Urinary tract infection without hematuria, site unspecified    ED Discharge Orders         Ordered    oxyCODONE-acetaminophen (PERCOCET) 5-325 MG tablet  Every 4 hours PRN     04/02/19 1344    ondansetron (ZOFRAN ODT) 4 MG disintegrating tablet  Every 8 hours PRN     04/02/19 1344    cephALEXin (KEFLEX) 500 MG capsule  4 times daily     04/02/19 1344    tamsulosin (FLOMAX) 0.4 MG CAPS capsule  Daily     04/02/19 1344        An After Visit Summary was printed and given to the patient.    Fransico Meadow, [place] 04/02/19 1347    Isla Pence, MD 04/02/19 [phone]

## 2019-04-02 NOTE — ED Triage Notes (Signed)
C/o right flank pain since yesterday. Vomiting started around 4am today. Denies bowel or urinary problems.

## 2019-04-02 NOTE — Discharge Instructions (Addendum)
Return if any problems.

## 2019-04-04 ENCOUNTER — Emergency Department (HOSPITAL_COMMUNITY): Payer: Medicaid Other | Admitting: Certified Registered Nurse Anesthetist

## 2019-04-04 ENCOUNTER — Encounter (HOSPITAL_COMMUNITY): Payer: Self-pay | Admitting: Emergency Medicine

## 2019-04-04 ENCOUNTER — Other Ambulatory Visit: Payer: Self-pay

## 2019-04-04 ENCOUNTER — Inpatient Hospital Stay (HOSPITAL_COMMUNITY)
Admission: EM | Admit: 2019-04-04 | Discharge: 2019-04-07 | DRG: 660 | Disposition: A | Discharge status: Home / Self Care | Payer: Medicaid Other | Attending: Urology | Admitting: Urology

## 2019-04-04 ENCOUNTER — Encounter (HOSPITAL_COMMUNITY)
Admission: EM | Disposition: A | Discharge status: Home / Self Care | Payer: Self-pay | Source: Home / Self Care | Attending: Urology

## 2019-04-04 ENCOUNTER — Emergency Department (HOSPITAL_COMMUNITY): Payer: Medicaid Other

## 2019-04-04 DIAGNOSIS — F17210 Nicotine dependence, cigarettes, uncomplicated: Secondary | ICD-10-CM | POA: Diagnosis present

## 2019-04-04 DIAGNOSIS — N390 Urinary tract infection, site not specified: Secondary | ICD-10-CM | POA: Diagnosis present

## 2019-04-04 DIAGNOSIS — R509 Fever, unspecified: Secondary | ICD-10-CM

## 2019-04-04 DIAGNOSIS — Z87440 Personal history of urinary (tract) infections: Secondary | ICD-10-CM | POA: Diagnosis not present

## 2019-04-04 DIAGNOSIS — Z20828 Contact with and (suspected) exposure to other viral communicable diseases: Secondary | ICD-10-CM | POA: Diagnosis present

## 2019-04-04 DIAGNOSIS — N179 Acute kidney failure, unspecified: Secondary | ICD-10-CM | POA: Diagnosis present

## 2019-04-04 DIAGNOSIS — N200 Calculus of kidney: Secondary | ICD-10-CM

## 2019-04-04 DIAGNOSIS — N202 Calculus of kidney with calculus of ureter: Secondary | ICD-10-CM | POA: Diagnosis present

## 2019-04-04 DIAGNOSIS — N201 Calculus of ureter: Principal | ICD-10-CM | POA: Diagnosis present

## 2019-04-04 DIAGNOSIS — Z8249 Family history of ischemic heart disease and other diseases of the circulatory system: Secondary | ICD-10-CM | POA: Diagnosis not present

## 2019-04-04 DIAGNOSIS — R6510 Systemic inflammatory response syndrome (SIRS) of non-infectious origin without acute organ dysfunction: Secondary | ICD-10-CM

## 2019-04-04 HISTORY — PX: CYSTOSCOPY W/ URETERAL STENT PLACEMENT: SHX1429

## 2019-04-04 LAB — CBC WITH DIFFERENTIAL/PLATELET
Abs Immature Granulocytes: 0.45 10*3/uL — ABNORMAL HIGH (ref 0.00–0.07)
Basophils Absolute: 0 10*3/uL (ref 0.0–0.1)
Basophils Relative: 1 %
Eosinophils Absolute: 0 10*3/uL (ref 0.0–0.5)
Eosinophils Relative: 0 %
HCT: 35.6 % — ABNORMAL LOW (ref 36.0–46.0)
Hemoglobin: 11.9 g/dL — ABNORMAL LOW (ref 12.0–15.0)
Immature Granulocytes: 14 %
Lymphocytes Relative: 5 %
Lymphs Abs: 0.2 10*3/uL — ABNORMAL LOW (ref 0.7–4.0)
MCH: 27.7 pg (ref 26.0–34.0)
MCHC: 33.4 g/dL (ref 30.0–36.0)
MCV: 82.8 fL (ref 80.0–100.0)
Monocytes Absolute: 0 10*3/uL — ABNORMAL LOW (ref 0.1–1.0)
Monocytes Relative: 1 %
Neutro Abs: 2.5 10*3/uL (ref 1.7–7.7)
Neutrophils Relative %: 79 %
Platelets: 98 10*3/uL — ABNORMAL LOW (ref 150–400)
RBC: 4.3 MIL/uL (ref 3.87–5.11)
RDW: 13.3 % (ref 11.5–15.5)
WBC: 3.2 10*3/uL — ABNORMAL LOW (ref 4.0–10.5)
nRBC: 0 % (ref 0.0–0.2)

## 2019-04-04 LAB — BASIC METABOLIC PANEL
Anion gap: 13 (ref 5–15)
BUN: 25 mg/dL — ABNORMAL HIGH (ref 6–20)
CO2: 13 mmol/L — ABNORMAL LOW (ref 22–32)
Calcium: 6.8 mg/dL — ABNORMAL LOW (ref 8.9–10.3)
Chloride: 103 mmol/L (ref 98–111)
Creatinine, Ser: 1.97 mg/dL — ABNORMAL HIGH (ref 0.44–1.00)
GFR calc Af Amer: 38 mL/min — ABNORMAL LOW (ref >60)
GFR calc non Af Amer: 33 mL/min — ABNORMAL LOW (ref >60)
Glucose, Bld: 131 mg/dL — ABNORMAL HIGH (ref 70–99)
Potassium: 3 mmol/L — ABNORMAL LOW (ref 3.5–5.1)
Sodium: 129 mmol/L — ABNORMAL LOW (ref 135–145)

## 2019-04-04 LAB — COMPREHENSIVE METABOLIC PANEL
ALT: 17 U/L (ref 0–44)
AST: 22 U/L (ref 15–41)
Albumin: 3.1 g/dL — ABNORMAL LOW (ref 3.5–5.0)
Alkaline Phosphatase: 208 U/L — ABNORMAL HIGH (ref 38–126)
Anion gap: 14 (ref 5–15)
BUN: 23 mg/dL — ABNORMAL HIGH (ref 6–20)
CO2: 15 mmol/L — ABNORMAL LOW (ref 22–32)
Calcium: 8.1 mg/dL — ABNORMAL LOW (ref 8.9–10.3)
Chloride: 98 mmol/L (ref 98–111)
Creatinine, Ser: 1.91 mg/dL — ABNORMAL HIGH (ref 0.44–1.00)
GFR calc Af Amer: 39 mL/min — ABNORMAL LOW (ref >60)
GFR calc non Af Amer: 34 mL/min — ABNORMAL LOW (ref >60)
Glucose, Bld: 104 mg/dL — ABNORMAL HIGH (ref 70–99)
Potassium: 3.3 mmol/L — ABNORMAL LOW (ref 3.5–5.1)
Sodium: 127 mmol/L — ABNORMAL LOW (ref 135–145)
Total Bilirubin: 1 mg/dL (ref 0.3–1.2)
Total Protein: 6.8 g/dL (ref 6.5–8.1)

## 2019-04-04 LAB — BLOOD CULTURE ID PANEL (REFLEXED)

## 2019-04-04 LAB — APTT: aPTT: 38 seconds — ABNORMAL HIGH (ref 24–36)

## 2019-04-04 LAB — PROTIME-INR
INR: 1.7 — ABNORMAL HIGH (ref 0.8–1.2)
Prothrombin Time: 19.4 seconds — ABNORMAL HIGH (ref 11.4–15.2)

## 2019-04-04 LAB — URINALYSIS, ROUTINE W REFLEX MICROSCOPIC
Bilirubin Urine: NEGATIVE
Glucose, UA: NEGATIVE mg/dL
Ketones, ur: NEGATIVE mg/dL
Nitrite: NEGATIVE
Protein, ur: 30 mg/dL — AB
Specific Gravity, Urine: 1.006 (ref 1.005–1.030)
pH: 6 (ref 5.0–8.0)

## 2019-04-04 LAB — MRSA PCR SCREENING: MRSA by PCR: NEGATIVE

## 2019-04-04 LAB — CBC
HCT: 29.9 % — ABNORMAL LOW (ref 36.0–46.0)
Hemoglobin: 10 g/dL — ABNORMAL LOW (ref 12.0–15.0)
MCH: 28 pg (ref 26.0–34.0)
MCHC: 33.4 g/dL (ref 30.0–36.0)
MCV: 83.8 fL (ref 80.0–100.0)
Platelets: 84 10*3/uL — ABNORMAL LOW (ref 150–400)
RBC: 3.57 MIL/uL — ABNORMAL LOW (ref 3.87–5.11)
RDW: 13.5 % (ref 11.5–15.5)
WBC: 18.8 10*3/uL — ABNORMAL HIGH (ref 4.0–10.5)
nRBC: 0 % (ref 0.0–0.2)

## 2019-04-04 LAB — I-STAT BETA HCG BLOOD, ED (MC, WL, AP ONLY): I-stat hCG, quantitative: <5 m[IU]/mL (ref <5)

## 2019-04-04 LAB — SARS CORONAVIRUS 2 BY RT PCR (HOSPITAL ORDER, PERFORMED IN ~~LOC~~ HOSPITAL LAB): SARS Coronavirus 2: NEGATIVE

## 2019-04-04 LAB — LACTIC ACID, PLASMA
Lactic Acid, Venous: 3.2 mmol/L (ref 0.5–1.9)
Lactic Acid, Venous: 3.5 mmol/L (ref 0.5–1.9)

## 2019-04-04 SURGERY — CYSTOSCOPY, WITH RETROGRADE PYELOGRAM AND URETERAL STENT INSERTION
Anesthesia: General | Site: Ureter | Laterality: Right

## 2019-04-04 MED ORDER — MEPERIDINE HCL 50 MG/ML IJ SOLN: 6.2500 mg | INTRAMUSCULAR | Status: DC | PRN

## 2019-04-04 MED ORDER — OXYCODONE HCL 5 MG PO TABS
5.0000 mg | ORAL_TABLET | ORAL | Status: DC | PRN
  Administered 2019-04-05 – 2019-04-07 (×8): 5 mg via ORAL
  Filled 2019-04-04 (×9): qty 1

## 2019-04-04 MED ORDER — HYDROMORPHONE HCL 1 MG/ML IJ SOLN
0.5000 mg | INTRAMUSCULAR | Status: DC | PRN
  Administered 2019-04-04 – 2019-04-05 (×2): 1 mg via INTRAVENOUS
  Administered 2019-04-06: 0.75 mg via INTRAVENOUS
  Administered 2019-04-06 (×2): 1 mg via INTRAVENOUS
  Administered 2019-04-06: 0.5 mg via INTRAVENOUS
  Administered 2019-04-06 – 2019-04-07 (×5): 1 mg via INTRAVENOUS
  Filled 2019-04-04 (×11): qty 1

## 2019-04-04 MED ORDER — SODIUM CHLORIDE 0.9 % IV SOLN
2.0000 g | Freq: Two times a day (BID) | INTRAVENOUS | Status: DC
  Administered 2019-04-04: 2 g via INTRAVENOUS
  Filled 2019-04-04: qty 2

## 2019-04-04 MED ORDER — ONDANSETRON HCL 4 MG/2ML IJ SOLN
INTRAMUSCULAR | Status: AC
  Filled 2019-04-04: qty 2

## 2019-04-04 MED ORDER — MIDAZOLAM HCL 5 MG/5ML IJ SOLN
INTRAMUSCULAR | Status: DC | PRN
  Administered 2019-04-04: 2 mg via INTRAVENOUS

## 2019-04-04 MED ORDER — SODIUM CHLORIDE 0.9 % IV SOLN
INTRAVENOUS | Status: DC
  Administered 2019-04-04 – 2019-04-06 (×5): via INTRAVENOUS

## 2019-04-04 MED ORDER — DEXAMETHASONE SODIUM PHOSPHATE 10 MG/ML IJ SOLN
INTRAMUSCULAR | Status: DC | PRN
  Administered 2019-04-04: 10 mg via INTRAVENOUS

## 2019-04-04 MED ORDER — ONDANSETRON HCL 4 MG/2ML IJ SOLN
INTRAMUSCULAR | Status: DC | PRN
  Administered 2019-04-04: 4 mg via INTRAVENOUS

## 2019-04-04 MED ORDER — FENTANYL CITRATE (PF) 100 MCG/2ML IJ SOLN
INTRAMUSCULAR | Status: AC
  Filled 2019-04-04: qty 2

## 2019-04-04 MED ORDER — IOHEXOL 300 MG/ML  SOLN
INTRAMUSCULAR | Status: DC | PRN
  Administered 2019-04-04: 8 mL via URETHRAL

## 2019-04-04 MED ORDER — LIP MEDEX EX OINT
TOPICAL_OINTMENT | CUTANEOUS | Status: AC
  Administered 2019-04-04: 10:00:00
  Filled 2019-04-04: qty 7

## 2019-04-04 MED ORDER — KETOROLAC TROMETHAMINE 30 MG/ML IJ SOLN
15.0000 mg | Freq: Once | INTRAMUSCULAR | Status: AC
  Administered 2019-04-04: 15 mg via INTRAVENOUS
  Filled 2019-04-04: qty 1

## 2019-04-04 MED ORDER — LIDOCAINE 2% (20 MG/ML) 5 ML SYRINGE
INTRAMUSCULAR | Status: DC | PRN
  Administered 2019-04-04: 60 mg via INTRAVENOUS

## 2019-04-04 MED ORDER — FENTANYL CITRATE (PF) 100 MCG/2ML IJ SOLN
INTRAMUSCULAR | Status: DC | PRN
  Administered 2019-04-04: 100 ug via INTRAVENOUS

## 2019-04-04 MED ORDER — VANCOMYCIN HCL IN DEXTROSE 1-5 GM/200ML-% IV SOLN
1000.0000 mg | Freq: Once | INTRAVENOUS | Status: AC
  Administered 2019-04-04: 1000 mg via INTRAVENOUS
  Filled 2019-04-04: qty 200

## 2019-04-04 MED ORDER — FENTANYL CITRATE (PF) 100 MCG/2ML IJ SOLN: 25.0000 ug | INTRAMUSCULAR | Status: DC | PRN

## 2019-04-04 MED ORDER — SODIUM CHLORIDE 0.9 % IR SOLN
Status: DC | PRN
  Administered 2019-04-04: 3000 mL

## 2019-04-04 MED ORDER — STERILE WATER FOR IRRIGATION IR SOLN
Status: DC | PRN
  Administered 2019-04-04: 10 mL

## 2019-04-04 MED ORDER — SODIUM CHLORIDE 0.9 % IV SOLN
INTRAVENOUS | Status: DC | PRN
  Administered 2019-04-04: 100 ug/min via INTRAVENOUS

## 2019-04-04 MED ORDER — 0.9 % SODIUM CHLORIDE (POUR BTL) OPTIME
TOPICAL | Status: DC | PRN
  Administered 2019-04-04: 1000 mL

## 2019-04-04 MED ORDER — PHENYLEPHRINE 40 MCG/ML (10ML) SYRINGE FOR IV PUSH (FOR BLOOD PRESSURE SUPPORT)
PREFILLED_SYRINGE | INTRAVENOUS | Status: DC | PRN
  Administered 2019-04-04 (×3): 120 ug via INTRAVENOUS

## 2019-04-04 MED ORDER — SODIUM CHLORIDE 0.9 % IV BOLUS
1000.0000 mL | Freq: Once | INTRAVENOUS | Status: AC
  Administered 2019-04-04: 1000 mL via INTRAVENOUS

## 2019-04-04 MED ORDER — SODIUM CHLORIDE 0.9 % IV SOLN
INTRAVENOUS | Status: DC | PRN
  Administered 2019-04-04: 05:00:00 via INTRAVENOUS

## 2019-04-04 MED ORDER — SUCCINYLCHOLINE CHLORIDE 200 MG/10ML IV SOSY
PREFILLED_SYRINGE | INTRAVENOUS | Status: DC | PRN
  Administered 2019-04-04: 100 mg via INTRAVENOUS

## 2019-04-04 MED ORDER — ORAL CARE MOUTH RINSE
15.0000 mL | Freq: Two times a day (BID) | OROMUCOSAL | Status: DC
  Administered 2019-04-04 – 2019-04-07 (×7): 15 mL via OROMUCOSAL

## 2019-04-04 MED ORDER — SODIUM CHLORIDE 0.9 % IV SOLN
2.0000 g | INTRAVENOUS | Status: DC
  Administered 2019-04-04 – 2019-04-06 (×3): 2 g via INTRAVENOUS
  Filled 2019-04-04: qty 2
  Filled 2019-04-04: qty 20
  Filled 2019-04-04 (×2): qty 2

## 2019-04-04 MED ORDER — PROPOFOL 10 MG/ML IV BOLUS
INTRAVENOUS | Status: AC
  Filled 2019-04-04: qty 20

## 2019-04-04 MED ORDER — MIDAZOLAM HCL 2 MG/2ML IJ SOLN
INTRAMUSCULAR | Status: AC
  Filled 2019-04-04: qty 2

## 2019-04-04 MED ORDER — SODIUM CHLORIDE 0.9 % IV SOLN
2.0000 g | Freq: Once | INTRAVENOUS | Status: AC
  Administered 2019-04-04: 2 g via INTRAVENOUS
  Filled 2019-04-04: qty 2

## 2019-04-04 MED ORDER — PHENYLEPHRINE HCL (PRESSORS) 10 MG/ML IV SOLN
INTRAVENOUS | Status: AC
  Filled 2019-04-04: qty 1

## 2019-04-04 MED ORDER — LACTATED RINGERS IV SOLN: INTRAVENOUS | Status: DC

## 2019-04-04 MED ORDER — SODIUM CHLORIDE 0.9 % IV BOLUS
30.0000 mL/kg | Freq: Once | INTRAVENOUS | Status: AC
  Administered 2019-04-04: 05:00:00 1000 mL via INTRAVENOUS

## 2019-04-04 MED ORDER — CHLORHEXIDINE GLUCONATE CLOTH 2 % EX PADS
6.0000 | MEDICATED_PAD | Freq: Every day | CUTANEOUS | Status: DC
  Administered 2019-04-04 – 2019-04-06 (×3): 6 via TOPICAL

## 2019-04-04 MED ORDER — PROPOFOL 10 MG/ML IV BOLUS
INTRAVENOUS | Status: DC | PRN
  Administered 2019-04-04: 130 mg via INTRAVENOUS

## 2019-04-04 MED ORDER — METOCLOPRAMIDE HCL 5 MG/ML IJ SOLN: 10.0000 mg | Freq: Once | INTRAMUSCULAR | Status: DC | PRN

## 2019-04-04 MED ORDER — LIDOCAINE 2% (20 MG/ML) 5 ML SYRINGE
INTRAMUSCULAR | Status: AC
  Filled 2019-04-04: qty 5

## 2019-04-04 MED ORDER — ACETAMINOPHEN 500 MG PO TABS
1000.0000 mg | ORAL_TABLET | Freq: Three times a day (TID) | ORAL | Status: AC
  Administered 2019-04-04 (×3): 1000 mg via ORAL
  Filled 2019-04-04 (×3): qty 2

## 2019-04-04 MED ORDER — DEXAMETHASONE SODIUM PHOSPHATE 10 MG/ML IJ SOLN
INTRAMUSCULAR | Status: AC
  Filled 2019-04-04: qty 1

## 2019-04-04 MED ORDER — PHENYLEPHRINE 40 MCG/ML (10ML) SYRINGE FOR IV PUSH (FOR BLOOD PRESSURE SUPPORT): PREFILLED_SYRINGE | INTRAVENOUS | Status: DC | PRN

## 2019-04-04 MED ORDER — METRONIDAZOLE IN NACL 5-0.79 MG/ML-% IV SOLN
500.0000 mg | Freq: Once | INTRAVENOUS | Status: AC
  Administered 2019-04-04: 500 mg via INTRAVENOUS
  Filled 2019-04-04: qty 100

## 2019-04-04 MED ORDER — SODIUM CHLORIDE 0.9 % IV BOLUS
500.0000 mL | Freq: Once | INTRAVENOUS | Status: AC
  Administered 2019-04-04: 500 mL via INTRAVENOUS

## 2019-04-04 SURGICAL SUPPLY — 20 items
BAG URO CATCHER STRL LF (MISCELLANEOUS) ×3 IMPLANT
BASKET ZERO TIP NITINOL 2.4FR (BASKET) IMPLANT
CATH INTERMIT  6FR 70CM (CATHETERS) IMPLANT
CLOTH BEACON ORANGE TIMEOUT ST (SAFETY) ×3 IMPLANT
GLOVE BIOGEL M STRL SZ7.5 (GLOVE) ×3 IMPLANT
GLOVE BIOGEL PI IND STRL 8 (GLOVE) ×1 IMPLANT
GLOVE BIOGEL PI INDICATOR 8 (GLOVE) ×2
GLOVE ECLIPSE 8.0 STRL XLNG CF (GLOVE) ×3 IMPLANT
GOWN STRL REUS W/TWL LRG LVL3 (GOWN DISPOSABLE) ×3 IMPLANT
GOWN STRL REUS W/TWL XL LVL3 (GOWN DISPOSABLE) ×3 IMPLANT
GUIDEWIRE ANG ZIPWIRE 038X150 (WIRE) ×3 IMPLANT
GUIDEWIRE STR DUAL SENSOR (WIRE) IMPLANT
KIT TURNOVER KIT A (KITS) IMPLANT
MANIFOLD NEPTUNE II (INSTRUMENTS) ×3 IMPLANT
PACK CYSTO (CUSTOM PROCEDURE TRAY) ×3 IMPLANT
STENT POLARIS 5FRX22 (STENTS) ×3 IMPLANT
TRAY FOLEY MTR SLVR 14FR STAT (SET/KITS/TRAYS/PACK) ×3 IMPLANT
TUBING CONNECTING 10 (TUBING) ×2 IMPLANT
TUBING CONNECTING 10' (TUBING) ×1
TUBING UROLOGY SET (TUBING) IMPLANT

## 2019-04-04 NOTE — H&P (Signed)
Ashley Spencer is an 33 y.o. female.    Chief Complaint: RIGHT Ureteral Stone, Urosepsis  HPI:  1 -  RIGHT Ureteral Stone - 9mm Rt distal ureteral stone by ER CT 10/22 on eval flank pain and malaise. Stone is solitary just proximal to UVJ.  2 - Urosepsis - fevers to 102, tachycardia, malaise UA with significant pyuria and bacteruria c/w urinary infection. Rt ureteral stone as per above. Lactate 3.2.   3 - Acute Renal Failure - Cr 1.9 by ER labs 10/23. Rt ureteral stone, poor PO intake, and NSAID use. Baseline Cr 1.0.   PMH unremarkable. NO CV disease /blood thinners. NO prior surgery. C19 screen negative.   Today "Ashley Spencer" is seen for urgent evaluation of above. Few sips of water 10pm 10/23. NO solid food in >24 hours. UPreg negative.   Past Medical History:  Diagnosis Date  . Arthritis   . Headache   . Migraine   . No pertinent past medical history   . Trichomonas     Past Surgical History:  Procedure Laterality Date  . DILATION AND CURETTAGE OF UTERUS      Family History  Problem Relation Age of Onset  . Migraines Father 10  . Hypertension Brother   . Hypertension Maternal Aunt   . Hypertension Maternal Uncle   . Hypertension Maternal Grandmother    Social History:  reports that she has been smoking cigarettes. She has a 3.00 pack-year smoking history. She has never used smokeless tobacco. She reports that she does not drink alcohol or use drugs.  Allergies: No Known Allergies  (Not in a hospital admission)   Results for orders placed or performed during the hospital encounter of 04/02/19 (from the past 48 hour(s))  CBC with Differential/Platelet     Status: Abnormal   Collection Time: 04/02/19  9:58 AM  Result Value Ref Range   WBC 14.5 (H) 4.0 - 10.5 K/uL   RBC 4.72 3.87 - 5.11 MIL/uL   Hemoglobin 13.0 12.0 - 15.0 g/dL   HCT 40.9 36.0 - 46.0 %   MCV 86.7 80.0 - 100.0 fL   MCH 27.5 26.0 - 34.0 pg   MCHC 31.8 30.0 - 36.0 g/dL   RDW 13.4 11.5 - 15.5 %   Platelets 251 150 - 400 K/uL   nRBC 0.0 0.0 - 0.2 %   Neutrophils Relative % 93 %   Neutro Abs 13.4 (H) 1.7 - 7.7 K/uL   Lymphocytes Relative 3 %   Lymphs Abs 0.5 (L) 0.7 - 4.0 K/uL   Monocytes Relative 3 %   Monocytes Absolute 0.5 0.1 - 1.0 K/uL   Eosinophils Relative 0 %   Eosinophils Absolute 0.0 0.0 - 0.5 K/uL   Basophils Relative 0 %   Basophils Absolute 0.0 0.0 - 0.1 K/uL   Immature Granulocytes 1 %   Abs Immature Granulocytes 0.12 (H) 0.00 - 0.07 K/uL    Comment: Performed at [facility], [place] [address] [place], [place] [address]  Comprehensive metabolic panel     Status: Abnormal   Collection Time: 04/02/19  9:58 AM  Result Value Ref Range   Sodium 133 (L) 135 - 145 mmol/L   Potassium 3.6 3.5 - 5.1 mmol/L   Chloride 102 98 - 111 mmol/L   CO2 22 22 - 32 mmol/L   Glucose, Bld 151 (H) 70 - 99 mg/dL   BUN 9 6 - 20 mg/dL   Creatinine, Ser 1.07 (H) 0.44 - 1.00 mg/dL  Calcium 9.3 8.9 - 10.3 mg/dL   Total Protein 7.9 6.5 - 8.1 g/dL   Albumin 4.2 3.5 - 5.0 g/dL   AST 15 15 - 41 U/L   ALT 12 0 - 44 U/L   Alkaline Phosphatase 71 38 - 126 U/L   Total Bilirubin 0.5 0.3 - 1.2 mg/dL   GFR calc non Af Amer >60 >60 mL/min   GFR calc Af Amer >60 >60 mL/min   Anion gap 9 5 - 15    Comment: Performed at [facility], [place] [address] [place], [place] [address]  hCG, quantitative, pregnancy     Status: None   Collection Time: 04/02/19  9:58 AM  Result Value Ref Range   hCG, Beta Chain, Quant, S <1 <5 mIU/mL    Comment:          GEST. AGE      CONC.  (mIU/mL)   <=1 WEEK        5 - 50     2 WEEKS       50 - 500     3 WEEKS       100 - 10,000     4 WEEKS     1,000 - 30,000     5 WEEKS     3,500 - 115,000   6-8 WEEKS     12,000 - 270,000    12 WEEKS     15,000 - 220,000        FEMALE AND NON-PREGNANT FEMALE:     LESS THAN 5 mIU/mL Performed at [facility], [place] [address], [place], [place] [address]   Urinalysis, Routine w  reflex microscopic     Status: Abnormal   Collection Time: 04/02/19 12:47 PM  Result Value Ref Range   Color, Urine YELLOW YELLOW   APPearance CLOUDY (A) CLEAR   Specific Gravity, Urine 1.024 1.005 - 1.030   pH 5.0 5.0 - 8.0   Glucose, UA NEGATIVE NEGATIVE mg/dL   Hgb urine dipstick MODERATE (A) NEGATIVE   Bilirubin Urine NEGATIVE NEGATIVE   Ketones, ur 20 (A) NEGATIVE mg/dL   Protein, ur 100 (A) NEGATIVE mg/dL   Nitrite POSITIVE (A) NEGATIVE   Leukocytes,Ua MODERATE (A) NEGATIVE   RBC / HPF 21-50 0 - 5 RBC/hpf   WBC, UA >50 (H) 0 - 5 WBC/hpf   Bacteria, UA MANY (A) NONE SEEN   Squamous Epithelial / LPF 6-10 0 - 5   Mucus PRESENT     Comment: Performed at [facility], [place] [address], [place], [place] [address]  Pregnancy, urine     Status: None   Collection Time: 04/02/19 12:47 PM  Result Value Ref Range   Preg Test, Ur NEGATIVE NEGATIVE    Comment:        THE SENSITIVITY OF THIS METHODOLOGY IS >20 mIU/mL. Performed at [facility] [place], [place] [address] [place], [place] [address]    Ct Renal Stone Study  Result Date: 04/02/2019 CLINICAL DATA:  Pt c/o flank pain and now abd pain as well C/o right flank pain since yesterday. Vomiting started around 4am today. Denies bowel or urinary problems. No hx of sx No hx of CA EXAM: CT ABDOMEN AND PELVIS WITHOUT CONTRAST TECHNIQUE: Multidetector CT imaging of the abdomen and pelvis was performed following the standard protocol without IV contrast. COMPARISON:  10/10/2016 FINDINGS: Lower chest: No acute abnormality. Hepatobiliary: No focal liver abnormality is seen. No radiopaque gallstones, biliary dilatation, or  pericholecystic inflammatory changes. Pancreas: Unremarkable. No pancreatic ductal dilatation or surrounding inflammatory changes. Spleen: Normal in size without focal abnormality. Adrenals/Urinary Tract: Adrenal glands are normal in appearance. There is RIGHT-sided hydronephrosis and RIGHT hydroureter  to level of the distal ureter where there is a calculus measuring 3 millimeters. This stone is estimated to be approximately 2-3 centimeters from the ureterovesical junction. The LEFT kidney and ureter are unremarkable. Urinary bladder is normal in appearance. Stomach/Bowel: Stomach is normal in appearance. Small bowel loops are unremarkable. No bowel obstruction. Colon is unremarkable. The appendix is well seen and has a normal appearance. Paucity of intra-abdominal fat limits evaluation of bowel on this noncontrast exam. Vascular/Lymphatic: No significant vascular findings are present. No enlarged abdominal or pelvic lymph nodes. Reproductive: Uterus is present. No adnexal mass. Other: No abdominal wall hernia or abnormality. No abdominopelvic ascites. Musculoskeletal: No acute or significant osseous findings. IMPRESSION: 1. Obstructing 3 mm calculus in the distal RIGHT ureter associated with hydronephrosis and hydroureter. 2. Normal appendix. 3. No bowel obstruction. Electronically Signed   By: Nolon Nations M.D.   On: 04/02/2019 13:26    Review of Systems  Constitutional: Positive for chills, fever and malaise/fatigue.  Genitourinary: Positive for flank pain.  All other systems reviewed and are negative.   Blood pressure 105/69, pulse (!) 147, temperature (!) 100.9 F (38.3 C), temperature source Oral, resp. rate (!) 21, height 5' (1.524 m), weight 52.2 kg, SpO2 98 %. Physical Exam  Constitutional: She appears well-developed.  Thin, nervous but pleasant and cooperative.   HENT:  Head: Normocephalic.  Eyes: Pupils are equal, round, and reactive to light.  Neck: Normal range of motion.  Cardiovascular:  Regular tachycardia  GI: Soft.  Genitourinary:    Genitourinary Comments: Mild Rt CVAT at present.    Musculoskeletal: Normal range of motion.  Neurological: She is alert.  Skin: Skin is warm.  Psychiatric: She has a normal mood and affect.     Assessment/Plan  1 -  RIGHT Ureteral  Stone - rec urgent renal decompression. Stent preferred as no contraindications. Risks, benefits, alternaitves, expected peri-op course discussed.   2 - Urosepsis - Rec continued IV ABX and in house management until afebrile for at least 24 hours. Low thrshold for ICU / critical care managmnet if develops pressor requironment. Plan for stepdown post-op.   3 - Acute Renal Failure - likely pre/post renal and NSAID use. This will likely quickly improve with hydration and renal decompression.   Alexis Frock, MD 04/04/2019, 1:39 AM

## 2019-04-04 NOTE — Anesthesia Procedure Notes (Signed)
Procedure Name: Intubation Date/Time: 04/04/2019 4:33 AM Performed by: Montel Clock, CRNA Pre-anesthesia Checklist: Patient identified, Emergency Drugs available, Suction available, Patient being monitored and Timeout performed Patient Re-evaluated:Patient Re-evaluated prior to induction Oxygen Delivery Method: Circle system utilized Preoxygenation: Pre-oxygenation with 100% oxygen Induction Type: IV induction Ventilation: Mask ventilation without difficulty Laryngoscope Size: Mac and 3 Grade View: Grade I Tube type: Oral Tube size: 7.5 mm Number of attempts: 1 Airway Equipment and Method: Stylet Placement Confirmation: ETT inserted through vocal cords under direct vision,  positive ETCO2 and breath sounds checked- equal and bilateral Secured at: 19 cm Tube secured with: Tape Dental Injury: Teeth and Oropharynx as per pre-operative assessment

## 2019-04-04 NOTE — Plan of Care (Signed)
  Problem: Education: Goal: Knowledge of General Education information will improve Description Including pain rating scale, medication(s)/side effects and non-pharmacologic comfort measures Outcome: Progressing   

## 2019-04-04 NOTE — Progress Notes (Signed)
CRITICAL VALUE ALERT  Critical Value:  Lactic 3.5  Date & Time Notied:  04/04/19 @ [id number]  Provider Notified: Dr. Gloriann Loan   Orders Received/Actions taken:  Give 1L NS

## 2019-04-04 NOTE — Progress Notes (Signed)
Pharmacy Antibiotic Note  Ashley Spencer is a 33 y.o. female admitted on 04/04/2019 with sepsis.  Pharmacy has been consulted for Cefepime dosing.  Plan: Cefepime 2gm iv q12hr  Height: 5' (152.4 cm) Weight: 115 lb (52.2 kg) IBW/kg (Calculated) : 45.5  Temp (24hrs), Avg:99.5 F (37.5 C), Min:98.4 F (36.9 C), Max:100.9 F (38.3 C)  Recent Labs  Lab 04/02/19 0958 04/04/19 0108  WBC 14.5* 3.2*  CREATININE 1.07* 1.91*  LATICACIDVEN  --  3.2*    Estimated Creatinine Clearance: 30.1 mL/min (A) (by C-G formula based on SCr of 1.91 mg/dL (H)).    No Known Allergies  Antimicrobials this admission: Vancomycin 04/04/2019 x1 Cefepime 04/04/2019 >>   Dose adjustments this admission: -  Microbiology results: -  Thank you for allowing pharmacy to be a part of this patient's care.  Nani Skillern Crowford 04/04/2019 6:30 AM

## 2019-04-04 NOTE — Progress Notes (Signed)
Patient arrived form ED, alert, oriented, warm and dry.  NS at 999/hr. Dr. Tresa Moore and Dr Marcell Barlow in to talk with patient regarding surgery and anesthesia.  PIV x 2 present on arrival. Permit signed and updated contact numbers.

## 2019-04-04 NOTE — Progress Notes (Signed)
Patient resting in bed BP 77/49 and HR 120. Patient denies any dizziness. Patient states, "I'm  just sleepy." Dr. Tresa Moore notified of vital signs no new orders received at this time. MD advised writer to continue to monitor patient and notify if patient becomes symptomatic or heart rate  sustains in the 140's.

## 2019-04-04 NOTE — Op Note (Signed)
NAME: Ashley Spencer, RAILING MEDICAL RECORD [id number] ACCOUNT [id number] DATE OF BIRTH:[birth year] FACILITY: [facility] LOCATION: [facility] PHYSICIAN:Davianna Deutschman, MD  OPERATIVE REPORT  DATE OF PROCEDURE:  04/04/2019  PREOPERATIVE DIAGNOSIS:  Right ureteral stone with sepsis.  PROCEDURE: 1.  Cystoscopy, right retrograde pyelogram, interpretation. 2.  Right ureteral stent placement 5 x 22 Polaris, no tether.  ESTIMATED BLOOD LOSS:  Nil.  MEDICATIONS:  None.  SPECIMENS:  None.  FINDINGS: 1.  Right distal ureteral stone with proximal hydronephrosis and tortuosity. 2.  Successful placement of right ureteral stent, proximal end in upper pole, distal end in urinary bladder.  INDICATIONS:  The patient is an otherwise healthy 33 year old young woman who was found on workup for colicky flank pain to have right distal ureteral stone.  On her initial presentation, she did have some bacteriuria, but otherwise systemically well.   She was given a brief trial of outpatient therapy.  She re-presented to the emergency room with increasing infectious parameters, tachycardia, lactic acidosis concerning for impending sepsis.  She was promptly given broad-spectrum IV antibiotics, fluid  resuscitation, and urgent consultation sought.  IMAGING:  Reviewed and it was clearly felt that she needed urgent renal decompression with stenting versus nephrostomy with stenting being preferred as less invasive.  As soon as her COVID-19 came back negative, she was promptly posted for this.   Informed consent was obtained and placed in the medical record.  DESCRIPTION OF PROCEDURE:  The patient being identified and procedure being right ureteral stent placement was confirmed.  Procedure timeout was performed.  Intravenous antibiotics were administered.  General anesthesia was induced.  The patient was  placed into a low lithotomy position.  A sterile field was created by prepping and draping the vagina, introitus, and  proximal thighs using iodine.  Cystourethroscopy was performed using a 21-French rigid cystoscope with offset lens.  Inspection of the  bladder revealed no diverticula, calcifications, or papillary lesions.  There was mild mucosal edema consistent with cystitis.  The right ureteral orifice was singleton and cannulated with 6-French renal catheter.  A right retrograde pyelogram was  obtained.  Very gentle right retrograde pyelogram demonstrated a single right ureter with single-system right kidney.  There was a calcification turned filling defect in the distal ureter consistent with known stone.  There was significant tortuosity.  A 0.038  sensor wire was then carefully advanced into the upper pole of which a new 5 x 22 Polaris-type stent was placed.  Using fluoroscopic guidance, good proximal and distal planes were noted.  Foley catheter was placed free to straight drain, 10 mL sterile  water in the balloon.  Anesthesia was terminated.  The patient tolerated the procedure well.  No immediate complications.  The patient was taken to postanesthesia care unit in stable condition with plan for step-down admission for close monitoring of her  sepsis parameters and continued IV antibiotics and fluid resuscitation.  LN/NUANCE  D:04/04/2019 T:04/04/2019 JOB:[id number]

## 2019-04-04 NOTE — Brief Op Note (Signed)
04/04/2019  4:50 AM  PATIENT:  Ashley Spencer  33 y.o. female  PRE-OPERATIVE DIAGNOSIS:  Ureteral stone  POST-OPERATIVE DIAGNOSIS:  Ureteral stone, Sepsis  PROCEDURE:  Procedure(s): CYSTOSCOPY WITH RETROGRADE PYELOGRAM/URETERAL STENT PLACEMENT (Right)  SURGEON:  Surgeon(s) and Role:    * Alexis Frock, MD - Primary  PHYSICIAN ASSISTANT:   ASSISTANTS: none   ANESTHESIA:   general  EBL:  minimal   BLOOD ADMINISTERED:none  DRAINS: foley to gravity   LOCAL MEDICATIONS USED:  NONE  SPECIMEN:  No Specimen  DISPOSITION OF SPECIMEN:  N/A  COUNTS:  YES  TOURNIQUET:  * No tourniquets in log *  DICTATION: .Other Dictation: Dictation Number [id number]  PLAN OF CARE: Admit to inpatient   PATIENT DISPOSITION:  PACU - hemodynamically stable.   Delay start of Pharmacological VTE agent (>24hrs) due to surgical blood loss or risk of bleeding: not applicable

## 2019-04-04 NOTE — ED Provider Notes (Signed)
[facility] Provider Note   CSN: [id number] Arrival date & time: 04/04/19  0014     History   Chief Complaint Chief Complaint  Patient presents with   Recurrent UTI   Fever    HPI Ashley Spencer is a 33 y.o. female.     The history is provided by the patient.  Fever Max temp prior to arrival:  102 Temp source:  Oral Severity:  Moderate Onset quality:  Gradual Timing:  Constant Progression:  Unchanged Chronicity:  New Relieved by:  Nothing Worsened by:  Nothing Ineffective treatments:  None tried Associated symptoms: chills and dysuria   Associated symptoms: no chest pain, no congestion and no cough   Associated symptoms comment:  Flank pain with known stone.   Risk factors: recent sickness   Risk factors: no hx of cancer     Past Medical History:  Diagnosis Date   Arthritis    Headache    Migraine    No pertinent past medical history    Trichomonas     There are no active problems to display for this patient.   Past Surgical History:  Procedure Laterality Date   DILATION AND CURETTAGE OF UTERUS       OB History    Gravida  5   Para  3   Term  3   Preterm      AB  2   Living  3     SAB      TAB  2   Ectopic      Multiple      Live Births               Home Medications    Prior to Admission medications   Medication Sig Start Date End Date Taking? Authorizing Provider  cephALEXin (KEFLEX) 500 MG capsule Take 1 capsule (500 mg total) by mouth 4 (four) times daily for 10 days. 04/02/19 04/12/19 Yes Caryl Ada K, PA-C  ibuprofen (ADVIL) 200 MG tablet Take 400 mg by mouth every 6 (six) hours as needed for headache or moderate pain.   Yes [provider]  ondansetron (ZOFRAN ODT) 4 MG disintegrating tablet Take 1 tablet (4 mg total) by mouth every 8 (eight) hours as needed for nausea or vomiting. 04/02/19  Yes Fransico Meadow, PA-C  oxyCODONE-acetaminophen (PERCOCET) 5-325 MG tablet Take 1  tablet by mouth every 4 (four) hours as needed for severe pain. 04/02/19 04/01/20 Yes Fransico Meadow, PA-C  tamsulosin (FLOMAX) 0.4 MG CAPS capsule Take 1 capsule (0.4 mg total) by mouth daily. 04/02/19  Yes Fransico Meadow, PA-C    Family History Family History  Problem Relation Age of Onset   Migraines Father 47   Hypertension Brother    Hypertension Maternal Aunt    Hypertension Maternal Uncle    Hypertension Maternal Grandmother     Social History Social History   Tobacco Use   Smoking status: Current Some Day Smoker    Packs/day: 1.00    Years: 3.00    Pack years: 3.00    Types: Cigarettes   Smokeless tobacco: Never Used  Substance Use Topics   Alcohol use: No   Drug use: No     Allergies   Patient has no known allergies.   Review of Systems Review of Systems  Constitutional: Positive for chills and fever.  HENT: Negative for congestion.   Eyes: Negative for visual disturbance.  Respiratory: Negative for cough and shortness of  breath.   Cardiovascular: Negative for chest pain.  Gastrointestinal: Negative for abdominal pain.  Genitourinary: Positive for dysuria and frequency.  Musculoskeletal: Negative for arthralgias.  Neurological: Negative for dizziness.  Psychiatric/Behavioral: Negative for agitation.  All other systems reviewed and are negative.    Physical Exam Updated Vital Signs BP 94/65 (BP Location: Right Arm)    Pulse (!) 135    Temp 99.7 F (37.6 C)    Resp 20    Ht 5' (1.524 m)    Wt 52.2 kg    SpO2 100%    BMI 22.46 kg/m   Physical Exam Vitals signs and nursing note reviewed.  Constitutional:      General: She is not in acute distress.    Appearance: She is normal weight.  HENT:     Head: Normocephalic and atraumatic.     Nose: Nose normal.  Eyes:     Conjunctiva/sclera: Conjunctivae normal.     Pupils: Pupils are equal, round, and reactive to light.  Neck:     Musculoskeletal: Normal range of motion and neck supple.    Cardiovascular:     Rate and Rhythm: Regular rhythm. Tachycardia present.     Pulses: Normal pulses.     Heart sounds: Normal heart sounds.  Pulmonary:     Effort: Pulmonary effort is normal.     Breath sounds: Normal breath sounds.  Abdominal:     General: Abdomen is flat. Bowel sounds are normal.     Tenderness: There is no abdominal tenderness. There is no guarding or rebound.  Musculoskeletal: Normal range of motion.  Skin:    General: Skin is warm and dry.     Capillary Refill: Capillary refill takes less than 2 seconds.  Neurological:     General: No focal deficit present.     Mental Status: She is alert and oriented to person, place, and time.  Psychiatric:        Mood and Affect: Mood normal.        Behavior: Behavior normal.      ED Treatments / Results  Labs (all labs ordered are listed, but only abnormal results are displayed) Results for orders placed or performed during the hospital encounter of 04/04/19  SARS Coronavirus 2 by RT PCR (hospital order, performed in [place] hospital lab) Nasopharyngeal Nasopharyngeal Swab   Specimen: Nasopharyngeal Swab  Result Value Ref Range   SARS Coronavirus 2 NEGATIVE NEGATIVE  Lactic acid, plasma  Result Value Ref Range   Lactic Acid, Venous 3.2 (HH) 0.5 - 1.9 mmol/L  Comprehensive metabolic panel  Result Value Ref Range   Sodium 127 (L) 135 - 145 mmol/L   Potassium 3.3 (L) 3.5 - 5.1 mmol/L   Chloride 98 98 - 111 mmol/L   CO2 15 (L) 22 - 32 mmol/L   Glucose, Bld 104 (H) 70 - 99 mg/dL   BUN 23 (H) 6 - 20 mg/dL   Creatinine, Ser 1.91 (H) 0.44 - 1.00 mg/dL   Calcium 8.1 (L) 8.9 - 10.3 mg/dL   Total Protein 6.8 6.5 - 8.1 g/dL   Albumin 3.1 (L) 3.5 - 5.0 g/dL   AST 22 15 - 41 U/L   ALT 17 0 - 44 U/L   Alkaline Phosphatase 208 (H) 38 - 126 U/L   Total Bilirubin 1.0 0.3 - 1.2 mg/dL   GFR calc non Af Amer 34 (L) >60 mL/min   GFR calc Af Amer 39 (L) >60 mL/min   Anion gap 14 5 -  15  CBC WITH DIFFERENTIAL  Result  Value Ref Range   WBC 3.2 (L) 4.0 - 10.5 K/uL   RBC 4.30 3.87 - 5.11 MIL/uL   Hemoglobin 11.9 (L) 12.0 - 15.0 g/dL   HCT 35.6 (L) 36.0 - 46.0 %   MCV 82.8 80.0 - 100.0 fL   MCH 27.7 26.0 - 34.0 pg   MCHC 33.4 30.0 - 36.0 g/dL   RDW 13.3 11.5 - 15.5 %   Platelets 98 (L) 150 - 400 K/uL   nRBC 0.0 0.0 - 0.2 %   Neutrophils Relative % 79 %   Neutro Abs 2.5 1.7 - 7.7 K/uL   Lymphocytes Relative 5 %   Lymphs Abs 0.2 (L) 0.7 - 4.0 K/uL   Monocytes Relative 1 %   Monocytes Absolute 0.0 (L) 0.1 - 1.0 K/uL   Eosinophils Relative 0 %   Eosinophils Absolute 0.0 0.0 - 0.5 K/uL   Basophils Relative 1 %   Basophils Absolute 0.0 0.0 - 0.1 K/uL   WBC Morphology DOHLE BODIES    Immature Granulocytes 14 %   Abs Immature Granulocytes 0.45 (H) 0.00 - 0.07 K/uL   Burr Cells PRESENT   APTT  Result Value Ref Range   aPTT 38 (H) 24 - 36 seconds  Protime-INR  Result Value Ref Range   Prothrombin Time 19.4 (H) 11.4 - 15.2 seconds   INR 1.7 (H) 0.8 - 1.2  Urinalysis, Routine w reflex microscopic  Result Value Ref Range   Color, Urine YELLOW YELLOW   APPearance HAZY (A) CLEAR   Specific Gravity, Urine 1.006 1.005 - 1.030   pH 6.0 5.0 - 8.0   Glucose, UA NEGATIVE NEGATIVE mg/dL   Hgb urine dipstick LARGE (A) NEGATIVE   Bilirubin Urine NEGATIVE NEGATIVE   Ketones, ur NEGATIVE NEGATIVE mg/dL   Protein, ur 30 (A) NEGATIVE mg/dL   Nitrite NEGATIVE NEGATIVE   Leukocytes,Ua TRACE (A) NEGATIVE   RBC / HPF 0-5 0 - 5 RBC/hpf   WBC, UA 11-20 0 - 5 WBC/hpf   Bacteria, UA RARE (A) NONE SEEN   Squamous Epithelial / LPF 0-5 0 - 5   Budding Yeast PRESENT   I-Stat beta hCG blood, ED  Result Value Ref Range   I-stat hCG, quantitative <5.0 <5 mIU/mL   Comment 3           Dg Chest Port 1 View  Result Date: 04/04/2019 CLINICAL DATA:  Fever EXAM: PORTABLE CHEST 1 VIEW COMPARISON:  None. FINDINGS: The heart size and mediastinal contours are within normal limits. Both lungs are clear. The visualized skeletal  structures are unremarkable. IMPRESSION: No acute cardiopulmonary process. Electronically Signed   By: Prudencio Pair M.D.   On: 04/04/2019 01:39   Ct Renal Stone Study  Result Date: 04/02/2019 CLINICAL DATA:  Pt c/o flank pain and now abd pain as well C/o right flank pain since yesterday. Vomiting started around 4am today. Denies bowel or urinary problems. No hx of sx No hx of CA EXAM: CT ABDOMEN AND PELVIS WITHOUT CONTRAST TECHNIQUE: Multidetector CT imaging of the abdomen and pelvis was performed following the standard protocol without IV contrast. COMPARISON:  10/10/2016 FINDINGS: Lower chest: No acute abnormality. Hepatobiliary: No focal liver abnormality is seen. No radiopaque gallstones, biliary dilatation, or pericholecystic inflammatory changes. Pancreas: Unremarkable. No pancreatic ductal dilatation or surrounding inflammatory changes. Spleen: Normal in size without focal abnormality. Adrenals/Urinary Tract: Adrenal glands are normal in appearance. There is RIGHT-sided hydronephrosis and RIGHT hydroureter to level of  the distal ureter where there is a calculus measuring 3 millimeters. This stone is estimated to be approximately 2-3 centimeters from the ureterovesical junction. The LEFT kidney and ureter are unremarkable. Urinary bladder is normal in appearance. Stomach/Bowel: Stomach is normal in appearance. Small bowel loops are unremarkable. No bowel obstruction. Colon is unremarkable. The appendix is well seen and has a normal appearance. Paucity of intra-abdominal fat limits evaluation of bowel on this noncontrast exam. Vascular/Lymphatic: No significant vascular findings are present. No enlarged abdominal or pelvic lymph nodes. Reproductive: Uterus is present. No adnexal mass. Other: No abdominal wall hernia or abnormality. No abdominopelvic ascites. Musculoskeletal: No acute or significant osseous findings. IMPRESSION: 1. Obstructing 3 mm calculus in the distal RIGHT ureter associated with  hydronephrosis and hydroureter. 2. Normal appendix. 3. No bowel obstruction. Electronically Signed   By: Nolon Nations M.D.   On: 04/02/2019 13:26    EKG EKG Interpretation  Date/Time:  Saturday April 04 2019 00:46:30 EDT Ventricular Rate:  140 PR Interval:    QRS Duration: 80 QT Interval:  279 QTC Calculation: 426 R Axis:   84 Text Interpretation:  Sinus tachycardia Confirmed by Dory Horn) on 04/04/2019 1:16:37 AM   Radiology Dg Chest Port 1 View  Result Date: 04/04/2019 CLINICAL DATA:  Fever EXAM: PORTABLE CHEST 1 VIEW COMPARISON:  None. FINDINGS: The heart size and mediastinal contours are within normal limits. Both lungs are clear. The visualized skeletal structures are unremarkable. IMPRESSION: No acute cardiopulmonary process. Electronically Signed   By: Prudencio Pair M.D.   On: 04/04/2019 01:39   Ct Renal Stone Study  Result Date: 04/02/2019 CLINICAL DATA:  Pt c/o flank pain and now abd pain as well C/o right flank pain since yesterday. Vomiting started around 4am today. Denies bowel or urinary problems. No hx of sx No hx of CA EXAM: CT ABDOMEN AND PELVIS WITHOUT CONTRAST TECHNIQUE: Multidetector CT imaging of the abdomen and pelvis was performed following the standard protocol without IV contrast. COMPARISON:  10/10/2016 FINDINGS: Lower chest: No acute abnormality. Hepatobiliary: No focal liver abnormality is seen. No radiopaque gallstones, biliary dilatation, or pericholecystic inflammatory changes. Pancreas: Unremarkable. No pancreatic ductal dilatation or surrounding inflammatory changes. Spleen: Normal in size without focal abnormality. Adrenals/Urinary Tract: Adrenal glands are normal in appearance. There is RIGHT-sided hydronephrosis and RIGHT hydroureter to level of the distal ureter where there is a calculus measuring 3 millimeters. This stone is estimated to be approximately 2-3 centimeters from the ureterovesical junction. The LEFT kidney and ureter are  unremarkable. Urinary bladder is normal in appearance. Stomach/Bowel: Stomach is normal in appearance. Small bowel loops are unremarkable. No bowel obstruction. Colon is unremarkable. The appendix is well seen and has a normal appearance. Paucity of intra-abdominal fat limits evaluation of bowel on this noncontrast exam. Vascular/Lymphatic: No significant vascular findings are present. No enlarged abdominal or pelvic lymph nodes. Reproductive: Uterus is present. No adnexal mass. Other: No abdominal wall hernia or abnormality. No abdominopelvic ascites. Musculoskeletal: No acute or significant osseous findings. IMPRESSION: 1. Obstructing 3 mm calculus in the distal RIGHT ureter associated with hydronephrosis and hydroureter. 2. Normal appendix. 3. No bowel obstruction. Electronically Signed   By: Nolon Nations M.D.   On: 04/02/2019 13:26    Procedures Procedures (including critical care time)  Medications Ordered in ED Medications  sodium chloride 0.9 % bolus 1,566 mL ( Intravenous MAR Hold 04/04/19 0434)  sodium chloride irrigation 0.9 % (3,000 mLs Irrigation Given 04/04/19 0428)  0.9 % irrigation (POUR BTL) (  1,000 mLs Irrigation Given 04/04/19 0428)  ceFEPIme (MAXIPIME) 2 g in sodium chloride 0.9 % 100 mL IVPB (0 g Intravenous Stopped 04/04/19 0205)  metroNIDAZOLE (FLAGYL) IVPB 500 mg (0 mg Intravenous Stopped 04/04/19 0236)  vancomycin (VANCOCIN) IVPB 1000 mg/200 mL premix (0 mg Intravenous Stopped 04/04/19 0237)  ketorolac (TORADOL) 30 MG/ML injection 15 mg (15 mg Intravenous Given 04/04/19 0133)  sodium chloride 0.9 % bolus 500 mL (500 mLs Intravenous New Bag/Given 04/04/19 0318)  lidocaine 20 MG/ML injection (has no administration in time range)  ondansetron (ZOFRAN) 4 MG/2ML injection (has no administration in time range)  fentaNYL (SUBLIMAZE) 100 MCG/2ML injection (has no administration in time range)  propofol (DIPRIVAN) 10 mg/mL bolus/IV push (has no administration in time range)    midazolam (VERSED) 2 MG/2ML injection (has no administration in time range)  dexamethasone (DECADRON) 10 MG/ML injection (has no administration in time range)     MDM Reviewed: previous chart, nursing note and vitals Reviewed previous: CT scan and labs Interpretation: CT scan, x-ray and labs (elevated white count and lactate infected urine) Total time providing critical care: 30-74 minutes (sepsis initiated fluids held until covid negative then 30 cc/kg given ). This excludes time spent performing separately reportable procedures and services. Consults: urology Dr. Tresa Moore.  CRITICAL CARE Performed by: Shiela Bruns K Treyson Axel-Rasch Total critical care time: 60 minutes Critical care time was exclusive of separately billable procedures and treating other patients. Critical care was necessary to treat or prevent imminent or life-threatening deterioration. Critical care was time spent personally by me on the following activities: development of treatment plan with patient and/or surrogate as well as nursing, discussions with consultants, evaluation of patient's response to treatment, examination of patient, obtaining history from patient or surrogate, ordering and performing treatments and interventions, ordering and review of laboratory studies, ordering and review of radiographic studies, pulse oximetry and re-evaluation of patient's condition.  Final Clinical Impressions(s) / ED Diagnoses   Final diagnoses:  Fever, unspecified fever cause  Kidney stone  SIRS (systemic inflammatory response syndrome) ([place])    Admit to OR    Novaleigh Kohlman, MD 04/04/19 [phone]

## 2019-04-04 NOTE — Plan of Care (Signed)
Pt with likely urosepsis from stone. IV ABX given. Waiting to post for urgent stent pending C19 rapid test. Called clinical lab who has specimen and verified rapid test pending to be resulted likely 0320 or so.

## 2019-04-04 NOTE — Progress Notes (Signed)
Patient's  BP 100/60 no complaints of dizziness at this time. Patient sitting in chair.

## 2019-04-04 NOTE — ED Triage Notes (Signed)
Pt from home c/o fever Right flank pain with SOB pt states she was dx with sepsis 1 day ago at [facility] and started antibiotic yesterday.

## 2019-04-04 NOTE — Transfer of Care (Signed)
Immediate Anesthesia Transfer of Care Note  Patient: Ashley Spencer  Procedure(s) Performed: CYSTOSCOPY WITH RETROGRADE PYELOGRAM/URETERAL STENT PLACEMENT (Right Ureter)  Patient Location: PACU  Anesthesia Type:General  Level of Consciousness: drowsy and patient cooperative  Airway & Oxygen Therapy: Patient Spontanous Breathing and Patient connected to face mask oxygen  Post-op Assessment: Report given to RN and Post -op Vital signs reviewed and stable  Post vital signs: Reviewed and stable  Last Vitals:  Vitals Value Taken Time  BP 131/86 04/04/19 0506  Temp    Pulse 140 04/04/19 0508  Resp 31 04/04/19 0511  SpO2 80 % 04/04/19 0508  Vitals shown include unvalidated device data.  Last Pain:  Vitals:   04/04/19 0038  TempSrc: Oral  PainSc:          Complications: No apparent anesthesia complications

## 2019-04-04 NOTE — Progress Notes (Signed)
PHARMACY - PHYSICIAN COMMUNICATION CRITICAL VALUE ALERT - BLOOD CULTURE IDENTIFICATION (BCID)  Ashley Spencer is an 33 y.o. female who presented to [facility] on 04/04/2019 with a chief complaint of Recurrent UTI, fever  Assessment:  Patient started on cefepime for sepsis,UTI.  4/4 blood cultures result E. Coli with no KPC resistance detected.   (include suspected source if known)  Name of physician (or Provider) Contacted: Dr. Tresa Moore  Current antibiotics: Cefepime  Changes to prescribed antibiotics recommended:  MD agreed to change to Ceftriaxone 2gm iv q24hr.  Will also d/c cefepime per pharmacy.  Results for orders placed or performed during the hospital encounter of 04/04/19  Blood Culture ID Panel (Reflexed) (Collected: 04/04/2019  1:08 AM)  Result Value Ref Range   Enterococcus species NOT DETECTED NOT DETECTED   Listeria monocytogenes NOT DETECTED NOT DETECTED   Staphylococcus species NOT DETECTED NOT DETECTED   Staphylococcus aureus (BCID) NOT DETECTED NOT DETECTED   Streptococcus species NOT DETECTED NOT DETECTED   Streptococcus agalactiae NOT DETECTED NOT DETECTED   Streptococcus pneumoniae NOT DETECTED NOT DETECTED   Streptococcus pyogenes NOT DETECTED NOT DETECTED   Acinetobacter baumannii NOT DETECTED NOT DETECTED   Enterobacteriaceae species DETECTED (A) NOT DETECTED   Enterobacter cloacae complex NOT DETECTED NOT DETECTED   Escherichia coli DETECTED (A) NOT DETECTED   Klebsiella oxytoca NOT DETECTED NOT DETECTED   Klebsiella pneumoniae NOT DETECTED NOT DETECTED   Proteus species NOT DETECTED NOT DETECTED   Serratia marcescens NOT DETECTED NOT DETECTED   Carbapenem resistance NOT DETECTED NOT DETECTED   Haemophilus influenzae NOT DETECTED NOT DETECTED   Neisseria meningitidis NOT DETECTED NOT DETECTED   Pseudomonas aeruginosa NOT DETECTED NOT DETECTED   Candida albicans NOT DETECTED NOT DETECTED   Candida glabrata NOT DETECTED NOT DETECTED   Candida krusei NOT  DETECTED NOT DETECTED   Candida parapsilosis NOT DETECTED NOT DETECTED   Candida tropicalis NOT DETECTED NOT DETECTED    Nani Skillern Crowford 04/04/2019  9:21 PM

## 2019-04-04 NOTE — Progress Notes (Signed)
Notified Dr. Tresa Moore about K+ level of 3.0 from this am, no new orders at this time.

## 2019-04-04 NOTE — Progress Notes (Signed)
A consult was received from an ED physician for Vancomycin, cefepime per pharmacy dosing.  The patient's profile has been reviewed for ht/wt/allergies/indication/available labs.   A one time order has been placed for Vancomycin 1gm iv x1, and Cefepime 2gm iv x1.  Further antibiotics/pharmacy consults should be ordered by admitting physician if indicated.                       Thank you, Nani Skillern Crowford 04/04/2019  12:47 AM

## 2019-04-04 NOTE — Anesthesia Preprocedure Evaluation (Signed)
Anesthesia Evaluation  Patient identified by MRN, date of birth, ID band Patient awake    Reviewed: Allergy & Precautions, NPO status , Patient's Chart, lab work & pertinent test results  Airway Mallampati: II  TM Distance: >3 FB Neck ROM: Full    Dental no notable dental hx.    Pulmonary neg pulmonary ROS, Current Smoker,    Pulmonary exam normal breath sounds clear to auscultation       Cardiovascular negative cardio ROS Normal cardiovascular exam Rhythm:Regular Rate:Normal     Neuro/Psych negative neurological ROS  negative psych ROS   GI/Hepatic negative GI ROS, Neg liver ROS,   Endo/Other  negative endocrine ROS  Renal/GU negative Renal ROS  negative genitourinary   Musculoskeletal negative musculoskeletal ROS (+)   Abdominal   Peds negative pediatric ROS (+)  Hematology negative hematology ROS (+)   Anesthesia Other Findings   Reproductive/Obstetrics negative OB ROS                             Anesthesia Physical Anesthesia Plan  ASA: II and emergent  Anesthesia Plan: General   Post-op Pain Management:    Induction: Intravenous  PONV Risk Score and Plan: 2 and Ondansetron, Dexamethasone and Treatment may vary due to age or medical condition  Airway Management Planned: Oral ETT  Additional Equipment:   Intra-op Plan:   Post-operative Plan: Extubation in OR  Informed Consent: I have reviewed the patients History and Physical, chart, labs and discussed the procedure including the risks, benefits and alternatives for the proposed anesthesia with the patient or authorized representative who has indicated his/her understanding and acceptance.     Dental advisory given  Plan Discussed with: CRNA  Anesthesia Plan Comments:         Anesthesia Quick Evaluation

## 2019-04-05 LAB — CBC WITH DIFFERENTIAL/PLATELET
Abs Immature Granulocytes: 0.71 10*3/uL — ABNORMAL HIGH (ref 0.00–0.07)
Basophils Absolute: 0.1 10*3/uL (ref 0.0–0.1)
Basophils Relative: 0 %
Eosinophils Absolute: 0 10*3/uL (ref 0.0–0.5)
Eosinophils Relative: 0 %
HCT: 31 % — ABNORMAL LOW (ref 36.0–46.0)
Hemoglobin: 10.2 g/dL — ABNORMAL LOW (ref 12.0–15.0)
Immature Granulocytes: 2 %
Lymphocytes Relative: 4 %
Lymphs Abs: 1.1 10*3/uL (ref 0.7–4.0)
MCH: 27.6 pg (ref 26.0–34.0)
MCHC: 32.9 g/dL (ref 30.0–36.0)
MCV: 83.8 fL (ref 80.0–100.0)
Monocytes Absolute: 1.5 10*3/uL — ABNORMAL HIGH (ref 0.1–1.0)
Monocytes Relative: 5 %
Neutro Abs: 25.9 10*3/uL — ABNORMAL HIGH (ref 1.7–7.7)
Neutrophils Relative %: 89 %
Platelets: 95 10*3/uL — ABNORMAL LOW (ref 150–400)
RBC: 3.7 MIL/uL — ABNORMAL LOW (ref 3.87–5.11)
RDW: 14.4 % (ref 11.5–15.5)
WBC: 29.4 10*3/uL — ABNORMAL HIGH (ref 4.0–10.5)
nRBC: 0 % (ref 0.0–0.2)

## 2019-04-05 LAB — LACTIC ACID, PLASMA: Lactic Acid, Venous: 2.2 mmol/L (ref 0.5–1.9)

## 2019-04-05 LAB — BASIC METABOLIC PANEL
Anion gap: 7 (ref 5–15)
BUN: 25 mg/dL — ABNORMAL HIGH (ref 6–20)
CO2: 15 mmol/L — ABNORMAL LOW (ref 22–32)
Calcium: 8.2 mg/dL — ABNORMAL LOW (ref 8.9–10.3)
Chloride: 112 mmol/L — ABNORMAL HIGH (ref 98–111)
Creatinine, Ser: 1.38 mg/dL — ABNORMAL HIGH (ref 0.44–1.00)
GFR calc Af Amer: 58 mL/min — ABNORMAL LOW (ref >60)
GFR calc non Af Amer: 50 mL/min — ABNORMAL LOW (ref >60)
Glucose, Bld: 140 mg/dL — ABNORMAL HIGH (ref 70–99)
Potassium: 4 mmol/L (ref 3.5–5.1)
Sodium: 134 mmol/L — ABNORMAL LOW (ref 135–145)

## 2019-04-05 MED ORDER — ACETAMINOPHEN 500 MG PO TABS
1000.0000 mg | ORAL_TABLET | Freq: Three times a day (TID) | ORAL | Status: DC
  Administered 2019-04-05 – 2019-04-07 (×7): 1000 mg via ORAL
  Filled 2019-04-05 (×7): qty 2

## 2019-04-05 NOTE — Anesthesia Postprocedure Evaluation (Signed)
Anesthesia Post Note  Patient: Ashley Spencer  Procedure(s) Performed: CYSTOSCOPY WITH RETROGRADE PYELOGRAM/URETERAL STENT PLACEMENT (Right Ureter)     Patient location during evaluation: PACU Anesthesia Type: General Level of consciousness: awake and alert Pain management: pain level controlled Vital Signs Assessment: post-procedure vital signs reviewed and stable Respiratory status: spontaneous breathing, nonlabored ventilation, respiratory function stable and patient connected to nasal cannula oxygen Cardiovascular status: blood pressure returned to baseline and stable Postop Assessment: no apparent nausea or vomiting Anesthetic complications: no    Last Vitals:  Vitals:   04/05/19 0530 04/05/19 0700  BP: 96/67   Pulse: 81   Resp: 20   Temp:  36.6 C  SpO2: 99%     Last Pain:  Vitals:   04/05/19 0700  TempSrc: Oral  PainSc:                  Montez Hageman

## 2019-04-05 NOTE — Progress Notes (Signed)
1 Day Post-Op   Subjective/Chief Complaint:   1 -  RIGHT Ureteral Stone - s/p urgent Rt JJ stent (5x22 polaris) 10/24 early AM for 77mm Rt distal ureteral stone by ER CT  on eval flank pain and malaise. Admitted stepdown post-op. Stone is solitary just proximal to UVJ.  2 - Urosepsis - fevers to 102, tachycardia, malaise UA with significant pyuria and bacteruria c/w urinary infection. Rt ureteral stone as per above. Initial Lactate 3.2. Given Maxipeme, changed to rocephin 10/24. UCX/BCX e.coli / pending pending.   3 - Acute Renal Failure - Cr 1.9 by ER labs 10/23. Rt ureteral stone, poor PO intake, and NSAID use. Baseline Cr 1.0. Improve to 1.4 10/25.   Today "Ashley Spencer" is improving but still with systemic inflammatory response. Cr and lactate improving, BCX positive for e. Coli. Fever curve trending down as is heart rate.    Objective: Vital signs in last 24 hours: Temp:  [97.8 F (36.6 C)-98.7 F (37.1 C)] 97.9 F (36.6 C) (10/25 0700) Pulse Rate:  [81-144] 81 (10/25 0900) Resp:  [16-39] 22 (10/25 0900) BP: (80-103)/(49-67) 90/66 (10/25 0900) SpO2:  [96 %-100 %] 99 % (10/25 0900) Last BM Date: 04/04/19  Intake/Output from previous day: 10/24 0701 - 10/25 0700 In: 2886.4 [P.O.:480; I.V.:2255.7; IV Piggyback:150.8] Out: 2050 [Urine:2050] Intake/Output this shift: No intake/output data recorded.   Physical Exam  Constitutional: She appears well-developed.  Thin, much less nervous, pleasant.  HENT:  Head: Normocephalic.  Eyes: Pupils are equal, round, and reactive to light.  Neck: Normal range of motion.  Cardiovascular:  HR 80s now and regular GI: Soft.  Genitourinary:    Genitourinary Comments: Improved Rt CVAT. Foley with copious non-foul urine.  Musculoskeletal: Normal range of motion.  Neurological: She is alert.  Skin: Skin is warm.  Psychiatric: She has a normal mood and affect.   Lab Results:  Recent Labs    04/04/19 0708 04/05/19 0604  WBC 18.8* 29.4*  HGB  10.0* 10.2*  HCT 29.9* 31.0*  PLT 84* 95*   BMET Recent Labs    04/04/19 0708 04/05/19 0604  NA 129* 134*  K 3.0* 4.0  CL 103 112*  CO2 13* 15*  GLUCOSE 131* 140*  BUN 25* 25*  CREATININE 1.97* 1.38*  CALCIUM 6.8* 8.2*   PT/INR Recent Labs    04/04/19 0108  LABPROT 19.4*  INR 1.7*   ABG No results for input(s): PHART, HCO3 in the last 72 hours.  Invalid input(s): PCO2, PO2  Studies/Results: Dg Chest Port 1 View  Result Date: 04/04/2019 CLINICAL DATA:  Fever EXAM: PORTABLE CHEST 1 VIEW COMPARISON:  None. FINDINGS: The heart size and mediastinal contours are within normal limits. Both lungs are clear. The visualized skeletal structures are unremarkable. IMPRESSION: No acute cardiopulmonary process. Electronically Signed   By: Prudencio Pair M.D.   On: 04/04/2019 01:39   Dg C-arm 1-60 Min-no Report  Result Date: 04/04/2019 Fluoroscopy was utilized by the requesting physician.  No radiographic interpretation.    Anti-infectives: Anti-infectives (From admission, onward)   Start     Dose/Rate Route Frequency Ordered Stop   04/04/19 2200  cefTRIAXone (ROCEPHIN) 2 g in sodium chloride 0.9 % 100 mL IVPB     2 g 200 mL/hr over 30 Minutes Intravenous Every 24 hours 04/04/19 2120     04/04/19 1200  ceFEPIme (MAXIPIME) 2 g in sodium chloride 0.9 % 100 mL IVPB  Status:  Discontinued     2 g 200 mL/hr over 30  Minutes Intravenous Every 12 hours 04/04/19 0629 04/04/19 2119   04/04/19 0045  ceFEPIme (MAXIPIME) 2 g in sodium chloride 0.9 % 100 mL IVPB     2 g 200 mL/hr over 30 Minutes Intravenous  Once 04/04/19 0044 04/04/19 0205   04/04/19 0045  metroNIDAZOLE (FLAGYL) IVPB 500 mg     500 mg 100 mL/hr over 60 Minutes Intravenous  Once 04/04/19 0044 04/04/19 0236   04/04/19 0045  vancomycin (VANCOCIN) IVPB 1000 mg/200 mL premix     1,000 mg 200 mL/hr over 60 Minutes Intravenous  Once 04/04/19 0044 04/04/19 0237      Assessment/Plan:  1 -  RIGHT Ureteral Stone - now  temporized with stent. Will need ureteroscopy in outpatient setting in few weeks after clears infectious parameters.   2 - Urosepsis - improving but still some sig signs of severe infection. Contiue IV ABX, foley, stepdown, daily labs.   3 - Acute Renal Failure - improving.   Alexis Frock 04/05/2019

## 2019-04-06 ENCOUNTER — Encounter (HOSPITAL_COMMUNITY): Payer: Self-pay | Admitting: Urology

## 2019-04-06 LAB — CULTURE, BLOOD (ROUTINE X 2)
Special Requests: ADEQUATE
Special Requests: ADEQUATE

## 2019-04-06 LAB — CBC
HCT: 31.9 % — ABNORMAL LOW (ref 36.0–46.0)
Hemoglobin: 10.2 g/dL — ABNORMAL LOW (ref 12.0–15.0)
MCH: 27.3 pg (ref 26.0–34.0)
MCHC: 32 g/dL (ref 30.0–36.0)
MCV: 85.5 fL (ref 80.0–100.0)
Platelets: 112 10*3/uL — ABNORMAL LOW (ref 150–400)
RBC: 3.73 MIL/uL — ABNORMAL LOW (ref 3.87–5.11)
RDW: 14.7 % (ref 11.5–15.5)
WBC: 21.9 10*3/uL — ABNORMAL HIGH (ref 4.0–10.5)
nRBC: 0 % (ref 0.0–0.2)

## 2019-04-06 LAB — BASIC METABOLIC PANEL
Anion gap: 7 (ref 5–15)
BUN: 24 mg/dL — ABNORMAL HIGH (ref 6–20)
CO2: 15 mmol/L — ABNORMAL LOW (ref 22–32)
Calcium: 8 mg/dL — ABNORMAL LOW (ref 8.9–10.3)
Chloride: 114 mmol/L — ABNORMAL HIGH (ref 98–111)
Creatinine, Ser: 1.13 mg/dL — ABNORMAL HIGH (ref 0.44–1.00)
GFR calc Af Amer: >60 mL/min (ref >60)
GFR calc non Af Amer: >60 mL/min (ref >60)
Glucose, Bld: 101 mg/dL — ABNORMAL HIGH (ref 70–99)
Potassium: 3.7 mmol/L (ref 3.5–5.1)
Sodium: 136 mmol/L (ref 135–145)

## 2019-04-06 LAB — URINE CULTURE: Culture: 2000 — AB

## 2019-04-06 LAB — LACTIC ACID, PLASMA: Lactic Acid, Venous: 1.8 mmol/L (ref 0.5–1.9)

## 2019-04-06 NOTE — Progress Notes (Signed)
2 Days Post-Op   Subjective/Chief Complaint:  1 -  RIGHT Ureteral Stone - s/p urgent Rt JJ stent (5x22 polaris) 10/24 early AM for 75mm Rt distal ureteral stone by ER CT  on eval flank pain and malaise. Admitted stepdown post-op. Stone is solitary just proximal to UVJ.   2 - Urosepsis - fevers to 102, tachycardia, malaise UA with significant pyuria and bacteruria c/w urinary infection. Rt ureteral stone as per above. Initial Lactate 3.2. Given Maxipeme, changed to rocephin 10/24. UCX/BCX e.coli / sens rocephin, keflex. Lactate cleared by 10/26.   3 - Acute Renal Failure - Cr 1.9 by ER labs 10/23. Rt ureteral stone, poor PO intake, and NSAID use. Baseline Cr 1.0. Improve to 1.1 by 10/26.   Today "Ashley Spencer" is continuing to improve. No fevers x 24 hours and BP and SBP improved. Lactate cleared by labs this AM.    Objective: Vital signs in last 24 hours: Temp:  [97.4 F (36.3 C)-98.8 F (37.1 C)] 98.8 F (37.1 C) (10/26 0730) Pulse Rate:  [69-103] 90 (10/26 1200) Resp:  [13-26] 26 (10/26 1200) BP: (87-117)/(59-89) 109/75 (10/26 1200) SpO2:  [89 %-100 %] 93 % (10/26 1200) Last BM Date: 04/04/19  Intake/Output from previous day: 10/25 0701 - 10/26 0700 In: 3977.3 [P.O.:720; I.V.:3160.7; IV Piggyback:96.6] Out: 750 [Urine:750] Intake/Output this shift: Total I/O In: 876.5 [I.V.:526.5; Other:350] Out: -    Physical Exam  Constitutional: She appears well-developed.  Thin, much less nervous, pleasant.  HENT:  Head: Normocephalic.  Eyes: Pupils are equal, round, and reactive to light.  Neck: Normal range of motion.  Cardiovascular:  HR 80s now and regular GI: Soft.  Genitourinary:    Genitourinary Comments: No CVAT. Foley with copious non-foul urine.  Musculoskeletal: Normal range of motion.  Neurological: She is alert.  Skin: Skin is warm.  Psychiatric: She has a normal mood and affect.   Lab Results:  Recent Labs    04/05/19 0604 04/06/19 0219  WBC 29.4* 21.9*  HGB 10.2*  10.2*  HCT 31.0* 31.9*  PLT 95* 112*   BMET Recent Labs    04/05/19 0604 04/06/19 0219  NA 134* 136  K 4.0 3.7  CL 112* 114*  CO2 15* 15*  GLUCOSE 140* 101*  BUN 25* 24*  CREATININE 1.38* 1.13*  CALCIUM 8.2* 8.0*   PT/INR Recent Labs    04/04/19 0108  LABPROT 19.4*  INR 1.7*   ABG No results for input(s): PHART, HCO3 in the last 72 hours.  Invalid input(s): PCO2, PO2  Studies/Results: No results found.  Anti-infectives: Anti-infectives (From admission, onward)   Start     Dose/Rate Route Frequency Ordered Stop   04/04/19 2200  cefTRIAXone (ROCEPHIN) 2 g in sodium chloride 0.9 % 100 mL IVPB     2 g 200 mL/hr over 30 Minutes Intravenous Every 24 hours 04/04/19 2120     04/04/19 1200  ceFEPIme (MAXIPIME) 2 g in sodium chloride 0.9 % 100 mL IVPB  Status:  Discontinued     2 g 200 mL/hr over 30 Minutes Intravenous Every 12 hours 04/04/19 0629 04/04/19 2119   04/04/19 0045  ceFEPIme (MAXIPIME) 2 g in sodium chloride 0.9 % 100 mL IVPB     2 g 200 mL/hr over 30 Minutes Intravenous  Once 04/04/19 0044 04/04/19 0205   04/04/19 0045  metroNIDAZOLE (FLAGYL) IVPB 500 mg     500 mg 100 mL/hr over 60 Minutes Intravenous  Once 04/04/19 0044 04/04/19 0236   04/04/19 0045  vancomycin (VANCOCIN) IVPB 1000 mg/200 mL premix     1,000 mg 200 mL/hr over 60 Minutes Intravenous  Once 04/04/19 0044 04/04/19 0237      Assessment/Plan:  1 -  RIGHT Ureteral Stone - now temporized with stent. Will need ureteroscopy in outpatient setting in few weeks after clears infectious parameters. Transfer to med-surg floor today. DC foley. IVF to 1/2 maintenance.   2 - Urosepsis - improving. Continue IV ABX for abother 24-48 hrs then DC on PO meds.   3 - Acute Renal Failure - improving.   Alexis Frock 04/06/2019

## 2019-04-07 ENCOUNTER — Encounter (HOSPITAL_COMMUNITY): Payer: Self-pay

## 2019-04-07 LAB — CBC
HCT: 31.7 % — ABNORMAL LOW (ref 36.0–46.0)
Hemoglobin: 10.6 g/dL — ABNORMAL LOW (ref 12.0–15.0)
MCH: 27.9 pg (ref 26.0–34.0)
MCHC: 33.4 g/dL (ref 30.0–36.0)
MCV: 83.4 fL (ref 80.0–100.0)
Platelets: 119 10*3/uL — ABNORMAL LOW (ref 150–400)
RBC: 3.8 MIL/uL — ABNORMAL LOW (ref 3.87–5.11)
RDW: 14.6 % (ref 11.5–15.5)
WBC: 17.7 10*3/uL — ABNORMAL HIGH (ref 4.0–10.5)
nRBC: 0 % (ref 0.0–0.2)

## 2019-04-07 MED ORDER — KETOROLAC TROMETHAMINE 10 MG PO TABS: 10.0000 mg | ORAL_TABLET | Freq: Three times a day (TID) | ORAL | 0 refills | Status: DC | PRN

## 2019-04-07 MED ORDER — OXYCODONE-ACETAMINOPHEN 5-325 MG PO TABS: 1.0000 | ORAL_TABLET | Freq: Four times a day (QID) | ORAL | 0 refills | Status: DC | PRN

## 2019-04-07 MED ORDER — CEPHALEXIN 500 MG PO CAPS: 500.0000 mg | ORAL_CAPSULE | Freq: Three times a day (TID) | ORAL | 0 refills | Status: AC

## 2019-04-07 NOTE — Discharge Summary (Signed)
Physician Discharge Summary  Patient ID: Ashley Spencer MRN: [id number] DOB/AGE: November 23, 1985 33 y.o.  Admit date: 04/04/2019 Discharge date: 04/07/2019  Admission Diagnoses: Ureteral Stone, Urosepsis  Discharge Diagnoses:  Active Problems:   Ureteral stone   Discharged Condition: good  Hospital Course:   1 -  RIGHT Ureteral Stone - s/p urgent Rt JJ stent (5x22 polaris) 10/24 early AM for 38mm Rt distal ureteral stone by ER CT  on eval flank pain and malaise. Admitted stepdown post-op. Stone is solitary just proximal to UVJ.   2 - Urosepsis - fevers to 102, tachycardia, malaise UA with significant pyuria and bacteruria c/w urinary infection. Rt ureteral stone as per above. Initial Lactate 3.2. Given Maxipeme, changed to rocephin 10/24. UCX/BCX e.coli / sens rocephin, keflex. Lactate cleared by 10/26.   3 - Acute Renal Failure - Cr 1.9 by ER labs 10/23. Rt ureteral stone, poor PO intake, and NSAID use. Baseline Cr 1.0. Improve to 1.1 by 10/26.   By the afternoon of 04/07/19, the day of discharge, she is ambulatory, tollerating PO intake, w/o high fevers, and felt to be adequate for discharge.   Consults: None  Significant Diagnostic Studies: labs: as per above.  Treatments: surgery: as per above and IV antibiotics.   Discharge Exam: Blood pressure (!) 140/95, pulse 100, temperature 98.9 F (37.2 C), temperature source Oral, resp. rate (!) 22, height 5' (1.524 m), weight 52.2 kg, SpO2 95 %.  Physical Exam Constitutional: She appearswell-developed. Thin, much less nervous, pleasant.  HENT:  Head:Normocephalic.  Eyes:Pupils are equal, round, and reactive to light.  Neck:Normal range of motion.  Cardiovascular: HR 80s now and regular [id number].  Genitourinary: Genitourinary Comments: No CVAT.  Musculoskeletal:Normal range of motion.  Neurological: She isalert.  Skin: Skin iswarm.  Psychiatric: She has anormal mood and affect.  Disposition:   HOME      Signed: Alexis Frock 04/07/2019, 1:00 PM

## 2019-04-07 NOTE — Discharge Instructions (Signed)
Dietary Guidelines to Help Prevent Kidney Stones Kidney stones are deposits of minerals and salts that form inside your kidneys. Your risk of developing kidney stones may be greater depending on your diet, your lifestyle, the medicines you take, and whether you have certain medical conditions. Most people can reduce their chances of developing kidney stones by following the instructions below. Depending on your overall health and the type of kidney stones you tend to develop, your dietitian may give you more specific instructions. What are tips for following this plan? Reading food labels  Choose foods with "no salt added" or "low-salt" labels. Limit your sodium intake to less than 1500 mg per day.  Choose foods with calcium for each meal and snack. Try to eat about 300 mg of calcium at each meal. Foods that contain 200-500 mg of calcium per serving include: ? 8 oz (237 ml) of milk, fortified nondairy milk, and fortified fruit juice. ? 8 oz (237 ml) of kefir, yogurt, and soy yogurt. ? 4 oz (118 ml) of tofu. ? 1 oz of cheese. ? 1 cup (300 g) of dried figs. ? 1 cup (91 g) of cooked broccoli. ? 1-3 oz can of sardines or mackerel.  Most people need 1000 to 1500 mg of calcium each day. Talk to your dietitian about how much calcium is recommended for you. Shopping  Buy plenty of fresh fruits and vegetables. Most people do not need to avoid fruits and vegetables, even if they contain nutrients that may contribute to kidney stones.  When shopping for convenience foods, choose: ? Whole pieces of fruit. ? Premade salads with dressing on the side. ? Low-fat fruit and yogurt smoothies.  Avoid buying frozen meals or prepared deli foods.  Look for foods with live cultures, such as yogurt and kefir. Cooking  Do not add salt to food when cooking. Place a salt shaker on the table and allow each person to add his or her own salt to taste.  Use vegetable protein, such as beans, textured vegetable  protein (TVP), or tofu instead of meat in pasta, casseroles, and soups. Meal planning   Eat less salt, if told by your dietitian. To do this: ? Avoid eating processed or premade food. ? Avoid eating fast food.  Eat less animal protein, including cheese, meat, poultry, or fish, if told by your dietitian. To do this: ? Limit the number of times you have meat, poultry, fish, or cheese each week. Eat a diet free of meat at least 2 days a week. ? Eat only one serving each day of meat, poultry, fish, or seafood. ? When you prepare animal protein, cut pieces into small portion sizes. For most meat and fish, one serving is about the size of one deck of cards.  Eat at least 5 servings of fresh fruits and vegetables each day. To do this: ? Keep fruits and vegetables on hand for snacks. ? Eat 1 piece of fruit or a handful of berries with breakfast. ? Have a salad and fruit at lunch. ? Have two kinds of vegetables at dinner.  Limit foods that are high in a substance called oxalate. These include: ? Spinach. ? Rhubarb. ? Beets. ? Potato chips and french fries. ? Nuts.  If you regularly take a diuretic medicine, make sure to eat at least 1-2 fruits or vegetables high in potassium each day. These include: ? Avocado. ? Banana. ? Orange, prune, carrot, or tomato juice. ? Baked potato. ? Cabbage. ? Beans and split   split peas. General instructions   Drink enough fluid to keep your urine clear or pale yellow. This is the most important thing you can do.  Talk to your health care provider and dietitian about taking daily supplements. Depending on your health and the cause of your kidney stones, you may be advised: ? Not to take supplements with vitamin C. ? To take a calcium supplement. ? To take a daily probiotic supplement. ? To take other supplements such as magnesium, fish oil, or vitamin B6.  Take all medicines and supplements as told by your health care provider.  Limit alcohol intake to no  more than 1 drink a day for nonpregnant women and 2 drinks a day for men. One drink equals 12 oz of beer, 5 oz of wine, or 1 oz of hard liquor.  Lose weight if told by your health care provider. Work with your dietitian to find strategies and an eating plan that works best for you. What foods are not recommended? Limit your intake of the following foods, or as told by your dietitian. Talk to your dietitian about specific foods you should avoid based on the type of kidney stones and your overall health. Grains Breads. Bagels. Rolls. Baked goods. Salted crackers. Cereal. Pasta. Vegetables Spinach. Rhubarb. Beets. Canned vegetables. Angie Fava. Olives. Meats and other protein foods Nuts. Nut butters. Large portions of meat, poultry, or fish. Salted or cured meats. Deli meats. Hot dogs. Sausages. Dairy Cheese. Beverages Regular soft drinks. Regular vegetable juice. Seasonings and other foods Seasoning blends with salt. Salad dressings. Canned soups. Soy sauce. Ketchup. Barbecue sauce. Canned pasta sauce. Casseroles. Pizza. Lasagna. Frozen meals. Potato chips. [place] fries. Summary  You can reduce your risk of kidney stones by making changes to your diet.  The most important thing you can do is drink enough fluid. You should drink enough fluid to keep your urine clear or pale yellow.  Ask your health care provider or dietitian how much protein from animal sources you should eat each day, and also how much salt and calcium you should have each day. This information is not intended to replace advice given to you by your health care provider. Make sure you discuss any questions you have with your health care provider. Document Released: 09/22/2010 Document Revised: 09/17/2018 Document Reviewed: 05/08/2016 Elsevier Patient Education  2020 [place].   Kidney Stones  Kidney stones (urolithiasis) are solid, rock-like deposits that form inside of the organs that make urine (kidneys). A kidney  stone may form in a kidney and move into the bladder, where it can cause intense pain and block the flow of urine. Kidney stones are created when high levels of certain minerals are found in the urine. They are usually passed through urination, but in some cases, medical treatment may be needed to remove them. What are the causes? Kidney stones may be caused by:  A condition in which certain glands produce too much parathyroid hormone (primary hyperparathyroidism), which causes too much calcium buildup in the blood.  Buildup of uric acid crystals in the bladder (hyperuricosuria). Uric acid is a chemical that the body produces when you eat certain foods. It usually exits the body in the urine.  Narrowing (stricture) of one or both of the tubes that drain urine from the kidneys to the bladder (ureters).  A kidney blockage that is present at birth (congenital obstruction).  Past surgery on the kidney or the ureters, such as gastric bypass surgery. What increases the risk? The following  factors make you more likely to develop kidney stones:  Having had a kidney stone in the past.  Having a family history of kidney stones.  Not drinking enough water.  Eating a diet that is high in protein, salt (sodium), or sugar.  Being overweight or obese. What are the signs or symptoms? Symptoms of a kidney stone may include:  Nausea.  Vomiting.  Blood in the urine (hematuria).  Pain in the side of the abdomen, right below the ribs (flank pain). Pain usually spreads (radiates) to the groin.  Needing to urinate frequently or urgently. How is this diagnosed? This condition may be diagnosed based on:  Your medical history.  A physical exam.  Blood tests.  Urine tests.  CT scan.  Abdominal X-ray.  A procedure to examine the inside of the bladder (cystoscopy). How is this treated? Treatment for kidney stones depends on the size, location, and makeup of the stones. Treatment may  involve:  Analyzing your urine before and after you pass the stone through urination.  Being monitored at the hospital until you pass the stone through urination.  Increasing your fluid intake and decreasing the amount of calcium and protein in your diet.  A procedure to break up kidney stones in the bladder using: ? A focused beam of light (laser therapy). ? Shock waves (extracorporeal shock wave lithotripsy).  Surgery to remove kidney stones. This may be needed if you have severe pain or have stones that block your urinary tract. Follow these instructions at home: Eating and drinking  Drink enough fluid to keep your urine clear or pale yellow. This will help you to pass the kidney stone.  If directed, change your diet. This may include: ? Limiting how much sodium you eat. ? Eating more fruits and vegetables. ? Limiting how much meat, poultry, fish, and eggs you eat.  Follow instructions from your health care provider about eating or drinking restrictions. General instructions  Collect urine samples as told by your health care provider. You may need to collect a urine sample: ? 24 hours after you pass the stone. ? 8-12 weeks after passing the kidney stone, and every 6-12 months after that.  Strain your urine every time you urinate, for as long as directed. Use the strainer that your health care provider recommends.  Do not throw out the kidney stone after passing it. Keep the stone so it can be tested by your health care provider. Testing the makeup of your kidney stone may help prevent you from getting kidney stones in the future.  Take over-the-counter and prescription medicines only as told by your health care provider.  Keep all follow-up visits as told by your health care provider. This is important. You may need follow-up X-rays or ultrasounds to make sure that your stone has passed. How is this prevented? To prevent another kidney stone:  Drink enough fluid to keep  your urine clear or pale yellow. This is the best way to prevent kidney stones.  Eat a healthy diet and follow recommendations from your health care provider about foods to avoid. You may be instructed to eat a low-protein diet. Recommendations vary depending on the type of kidney stone that you have.  Maintain a healthy weight. Contact a health care provider if:  You have pain that gets worse or does not get better with medicine. Get help right away if:  You have a fever or chills.  You develop severe pain.  You develop new abdominal pain.  You faint.  You are unable to urinate. This information is not intended to replace advice given to you by your health care provider. Make sure you discuss any questions you have with your health care provider. Document Released: 05/28/2005 Document Revised: 01/07/2018 Document Reviewed: 11/11/2015 Elsevier Patient Education  2020 [place] may have urinary urgency (bladder spasms) and bloody urine on / off with stent in place. This is normal.  2 - Call MD or go to ER for fever >102, severe pain / nausea / vomiting not relieved by medications, or acute change in medical status  3 - OK to return to work without restrictions on 04/13/2019.

## 2019-04-10 ENCOUNTER — Other Ambulatory Visit: Payer: Self-pay | Admitting: Urology

## 2019-04-18 ENCOUNTER — Other Ambulatory Visit (HOSPITAL_COMMUNITY): Payer: Medicaid Other

## 2019-04-20 ENCOUNTER — Other Ambulatory Visit (HOSPITAL_COMMUNITY)
Admission: RE | Admit: 2019-04-20 | Discharge: 2019-04-20 | Disposition: A | Discharge status: Home / Self Care | Payer: Medicaid Other | Source: Ambulatory Visit | Attending: Urology | Admitting: Urology

## 2019-04-20 DIAGNOSIS — Z20828 Contact with and (suspected) exposure to other viral communicable diseases: Secondary | ICD-10-CM | POA: Insufficient documentation

## 2019-04-20 DIAGNOSIS — Z01812 Encounter for preprocedural laboratory examination: Secondary | ICD-10-CM | POA: Insufficient documentation

## 2019-04-20 LAB — SARS CORONAVIRUS 2 (TAT 6-24 HRS): SARS Coronavirus 2: NEGATIVE

## 2019-04-21 ENCOUNTER — Other Ambulatory Visit: Payer: Self-pay

## 2019-04-21 ENCOUNTER — Encounter (HOSPITAL_BASED_OUTPATIENT_CLINIC_OR_DEPARTMENT_OTHER): Payer: Self-pay | Admitting: *Deleted

## 2019-04-21 NOTE — Progress Notes (Signed)
Spoke w/ via phone for pre-op interview--- Lab needs dos---- Istat 8 and Urine preg             Lab results------  EKG 04-04-2019 chart/ epic COVID test ------ 04-18-2019 Arrive at ------ 04-20-2019 NPO after ------ MN w/ exception clear liquids until 0900 then nothing by mouth (no cream/ milk products) Medications to take morning of surgery ----- NONE Diabetic medication ----- n/a Patient Special Instructions ----- n/a Pre-Op special Istructions ----- n/a Patient verbalized understanding of instructions that were given at this phone interview. Patient denies shortness of breath, chest pain, fever, cough a this phone interview.

## 2019-04-22 ENCOUNTER — Encounter (HOSPITAL_BASED_OUTPATIENT_CLINIC_OR_DEPARTMENT_OTHER): Payer: Self-pay | Admitting: Emergency Medicine

## 2019-04-22 ENCOUNTER — Ambulatory Visit (HOSPITAL_BASED_OUTPATIENT_CLINIC_OR_DEPARTMENT_OTHER): Payer: Medicaid Other | Admitting: Anesthesiology

## 2019-04-22 ENCOUNTER — Encounter (HOSPITAL_BASED_OUTPATIENT_CLINIC_OR_DEPARTMENT_OTHER)
Admission: RE | Disposition: A | Discharge status: Home / Self Care | Payer: Self-pay | Source: Home / Self Care | Attending: Urology

## 2019-04-22 ENCOUNTER — Other Ambulatory Visit: Payer: Self-pay

## 2019-04-22 ENCOUNTER — Ambulatory Visit (HOSPITAL_BASED_OUTPATIENT_CLINIC_OR_DEPARTMENT_OTHER)
Admission: RE | Admit: 2019-04-22 | Discharge: 2019-04-22 | Disposition: A | Discharge status: Home / Self Care | Payer: Medicaid Other | Attending: Urology | Admitting: Urology

## 2019-04-22 DIAGNOSIS — N201 Calculus of ureter: Secondary | ICD-10-CM | POA: Insufficient documentation

## 2019-04-22 DIAGNOSIS — Z87891 Personal history of nicotine dependence: Secondary | ICD-10-CM | POA: Insufficient documentation

## 2019-04-22 DIAGNOSIS — M1990 Unspecified osteoarthritis, unspecified site: Secondary | ICD-10-CM | POA: Diagnosis not present

## 2019-04-22 HISTORY — PX: CYSTOSCOPY WITH RETROGRADE PYELOGRAM, URETEROSCOPY AND STENT PLACEMENT: SHX5789

## 2019-04-22 HISTORY — DX: Anemia, unspecified: D64.9

## 2019-04-22 HISTORY — DX: Personal history of other infectious and parasitic diseases: Z86.19

## 2019-04-22 HISTORY — DX: Calculus of ureter: N20.1

## 2019-04-22 HISTORY — DX: Nocturia: R35.1

## 2019-04-22 LAB — POCT PREGNANCY, URINE: Preg Test, Ur: NEGATIVE

## 2019-04-22 SURGERY — CYSTOURETEROSCOPY, WITH RETROGRADE PYELOGRAM AND STENT INSERTION
Anesthesia: General | Laterality: Right

## 2019-04-22 MED ORDER — CEPHALEXIN 500 MG PO CAPS: 500.0000 mg | ORAL_CAPSULE | Freq: Two times a day (BID) | ORAL | 0 refills | Status: AC

## 2019-04-22 MED ORDER — OXYCODONE HCL 5 MG PO TABS
5.0000 mg | ORAL_TABLET | Freq: Once | ORAL | Status: AC | PRN
  Administered 2019-04-22: 5 mg via ORAL
  Filled 2019-04-22: qty 1

## 2019-04-22 MED ORDER — OXYCODONE HCL 5 MG PO TABS
ORAL_TABLET | ORAL | Status: AC
  Filled 2019-04-22: qty 1

## 2019-04-22 MED ORDER — ACETAMINOPHEN 160 MG/5ML PO SOLN
325.0000 mg | ORAL | Status: DC | PRN
  Filled 2019-04-22: qty 20.3

## 2019-04-22 MED ORDER — LIDOCAINE 2% (20 MG/ML) 5 ML SYRINGE
INTRAMUSCULAR | Status: DC | PRN
  Administered 2019-04-22: 100 mg via INTRAVENOUS

## 2019-04-22 MED ORDER — LACTATED RINGERS IV SOLN
INTRAVENOUS | Status: DC
  Administered 2019-04-22: 14:00:00 via INTRAVENOUS
  Filled 2019-04-22: qty 1000

## 2019-04-22 MED ORDER — SODIUM CHLORIDE 0.9 % IV SOLN
INTRAVENOUS | Status: AC
  Filled 2019-04-22: qty 100

## 2019-04-22 MED ORDER — KETOROLAC TROMETHAMINE 30 MG/ML IJ SOLN
INTRAMUSCULAR | Status: AC
  Filled 2019-04-22: qty 1

## 2019-04-22 MED ORDER — FENTANYL CITRATE (PF) 100 MCG/2ML IJ SOLN
25.0000 ug | INTRAMUSCULAR | Status: DC | PRN
  Filled 2019-04-22: qty 1

## 2019-04-22 MED ORDER — ONDANSETRON HCL 4 MG/2ML IJ SOLN
INTRAMUSCULAR | Status: DC | PRN
  Administered 2019-04-22: 4 mg via INTRAVENOUS

## 2019-04-22 MED ORDER — KETOROLAC TROMETHAMINE 10 MG PO TABS: 10.0000 mg | ORAL_TABLET | Freq: Three times a day (TID) | ORAL | 0 refills | Status: DC | PRN

## 2019-04-22 MED ORDER — DEXAMETHASONE SODIUM PHOSPHATE 10 MG/ML IJ SOLN
INTRAMUSCULAR | Status: AC
  Filled 2019-04-22: qty 1

## 2019-04-22 MED ORDER — FENTANYL CITRATE (PF) 100 MCG/2ML IJ SOLN
INTRAMUSCULAR | Status: AC
  Filled 2019-04-22: qty 2

## 2019-04-22 MED ORDER — OXYCODONE HCL 5 MG/5ML PO SOLN
5.0000 mg | Freq: Once | ORAL | Status: AC | PRN
  Filled 2019-04-22: qty 5

## 2019-04-22 MED ORDER — MIDAZOLAM HCL 5 MG/5ML IJ SOLN
INTRAMUSCULAR | Status: DC | PRN
  Administered 2019-04-22: 2 mg via INTRAVENOUS

## 2019-04-22 MED ORDER — FENTANYL CITRATE (PF) 100 MCG/2ML IJ SOLN
INTRAMUSCULAR | Status: DC | PRN
  Administered 2019-04-22 (×2): 50 ug via INTRAVENOUS

## 2019-04-22 MED ORDER — IOHEXOL 300 MG/ML  SOLN
INTRAMUSCULAR | Status: DC | PRN
  Administered 2019-04-22: 16:00:00 10 mL via URETHRAL

## 2019-04-22 MED ORDER — LIDOCAINE 2% (20 MG/ML) 5 ML SYRINGE
INTRAMUSCULAR | Status: AC
  Filled 2019-04-22: qty 5

## 2019-04-22 MED ORDER — CEFTRIAXONE SODIUM 1 G IJ SOLR
INTRAMUSCULAR | Status: AC
  Filled 2019-04-22: qty 10

## 2019-04-22 MED ORDER — DEXAMETHASONE SODIUM PHOSPHATE 10 MG/ML IJ SOLN
INTRAMUSCULAR | Status: DC | PRN
  Administered 2019-04-22: 10 mg via INTRAVENOUS

## 2019-04-22 MED ORDER — TRAMADOL HCL 50 MG PO TABS: 50.0000 mg | ORAL_TABLET | Freq: Four times a day (QID) | ORAL | 0 refills | Status: DC | PRN

## 2019-04-22 MED ORDER — ONDANSETRON HCL 4 MG/2ML IJ SOLN
4.0000 mg | Freq: Once | INTRAMUSCULAR | Status: DC | PRN
  Filled 2019-04-22: qty 2

## 2019-04-22 MED ORDER — MIDAZOLAM HCL 2 MG/2ML IJ SOLN
INTRAMUSCULAR | Status: AC
  Filled 2019-04-22: qty 2

## 2019-04-22 MED ORDER — PROPOFOL 10 MG/ML IV BOLUS
INTRAVENOUS | Status: AC
  Filled 2019-04-22: qty 20

## 2019-04-22 MED ORDER — SODIUM CHLORIDE 0.9 % IV SOLN
1.0000 g | INTRAVENOUS | Status: AC
  Administered 2019-04-22: 2 g via INTRAVENOUS
  Filled 2019-04-22: qty 10

## 2019-04-22 MED ORDER — ACETAMINOPHEN 325 MG PO TABS
325.0000 mg | ORAL_TABLET | ORAL | Status: DC | PRN
  Filled 2019-04-22: qty 2

## 2019-04-22 MED ORDER — PROPOFOL 10 MG/ML IV BOLUS
INTRAVENOUS | Status: DC | PRN
  Administered 2019-04-22: 100 mg via INTRAVENOUS

## 2019-04-22 MED ORDER — MEPERIDINE HCL 25 MG/ML IJ SOLN
6.2500 mg | INTRAMUSCULAR | Status: DC | PRN
  Filled 2019-04-22: qty 1

## 2019-04-22 MED ORDER — KETOROLAC TROMETHAMINE 30 MG/ML IJ SOLN
INTRAMUSCULAR | Status: DC | PRN
  Administered 2019-04-22: 30 mg via INTRAVENOUS

## 2019-04-22 MED ORDER — ONDANSETRON HCL 4 MG/2ML IJ SOLN
INTRAMUSCULAR | Status: AC
  Filled 2019-04-22: qty 2

## 2019-04-22 SURGICAL SUPPLY — 23 items
BAG DRAIN URO-CYSTO SKYTR STRL (DRAIN) ×3 IMPLANT
BASKET LASER NITINOL 1.9FR (BASKET) IMPLANT
CATH INTERMIT  6FR 70CM (CATHETERS) IMPLANT
CLOTH BEACON ORANGE TIMEOUT ST (SAFETY) ×3 IMPLANT
FIBER LASER FLEXIVA 365 (UROLOGICAL SUPPLIES) IMPLANT
FIBER LASER TRAC TIP (UROLOGICAL SUPPLIES) IMPLANT
GLOVE BIO SURGEON STRL SZ7.5 (GLOVE) ×3 IMPLANT
GOWN STRL REUS W/TWL LRG LVL3 (GOWN DISPOSABLE) ×3 IMPLANT
GUIDEWIRE ANG ZIPWIRE 038X150 (WIRE) ×3 IMPLANT
GUIDEWIRE STR DUAL SENSOR (WIRE) ×3 IMPLANT
IV NS 1000ML (IV SOLUTION) ×2
IV NS 1000ML BAXH (IV SOLUTION) ×1 IMPLANT
IV NS IRRIG 3000ML ARTHROMATIC (IV SOLUTION) ×3 IMPLANT
KIT TURNOVER CYSTO (KITS) ×3 IMPLANT
MANIFOLD NEPTUNE II (INSTRUMENTS) ×3 IMPLANT
NS IRRIG 500ML POUR BTL (IV SOLUTION) ×6 IMPLANT
PACK CYSTO (CUSTOM PROCEDURE TRAY) ×3 IMPLANT
STENT POLARIS 5FRX22 (STENTS) ×3 IMPLANT
SYR 10ML LL (SYRINGE) ×3 IMPLANT
TUBE CONNECTING 12'X1/4 (SUCTIONS)
TUBE CONNECTING 12X1/4 (SUCTIONS) IMPLANT
TUBE FEEDING 8FR 16IN STR KANG (MISCELLANEOUS) IMPLANT
TUBING UROLOGY SET (TUBING) ×3 IMPLANT

## 2019-04-22 NOTE — Transfer of Care (Signed)
Immediate Anesthesia Transfer of Care Note  Patient: Ashley Spencer  Procedure(s) Performed: CYSTOSCOPY WITH RETROGRADE PYELOGRAM, DIAGNOSTIC URETEROSCOPY AND STENT EXCHANGE (Right )  Patient Location: PACU  Anesthesia Type:General  Level of Consciousness: awake, alert  and oriented  Airway & Oxygen Therapy: Patient Spontanous Breathing and Patient connected to nasal cannula oxygen  Post-op Assessment: Report given to RN  Post vital signs: Reviewed and stable  Last Vitals: 119/83, 98.8 Vitals Value Taken Time  BP    Temp    Pulse 79 04/22/19 1631  Resp 9 04/22/19 1631  SpO2 99 % 04/22/19 1631  Vitals shown include unvalidated device data.  Last Pain:  Vitals:   04/22/19 1340  TempSrc: Oral      Patients Stated Pain Goal: 4 ([id number])  Complications: No apparent anesthesia complications

## 2019-04-22 NOTE — Brief Op Note (Signed)
04/22/2019  4:19 PM  PATIENT:  Ashley Spencer  33 y.o. female  PRE-OPERATIVE DIAGNOSIS:  RIGHT URETERAL STONE  POST-OPERATIVE DIAGNOSIS:  RIGHT URETERAL STONE  PROCEDURE:  Procedure(s) with comments: CYSTOSCOPY WITH RETROGRADE PYELOGRAM, DIAGNOSTIC URETEROSCOPY AND STENT EXCHANGE (Right) - 60 MINS HOLMIUM LASER APPLICATION (Right)  SURGEON:  Surgeon(s) and Role:    * Alexis Frock, MD - Primary  PHYSICIAN ASSISTANT:   ASSISTANTS: none   ANESTHESIA:   general  EBL:  minimal   BLOOD ADMINISTERED:none  DRAINS: none   LOCAL MEDICATIONS USED:  NONE  SPECIMEN:  No Specimen  DISPOSITION OF SPECIMEN:  N/A  COUNTS:  YES  TOURNIQUET:  * No tourniquets in log *  DICTATION: .Other Dictation: Dictation Number [id number]  PLAN OF CARE: Discharge to home after PACU  PATIENT DISPOSITION:  PACU - hemodynamically stable.   Delay start of Pharmacological VTE agent (>24hrs) due to surgical blood loss or risk of bleeding: not applicable

## 2019-04-22 NOTE — Anesthesia Procedure Notes (Signed)
Procedure Name: LMA Insertion Date/Time: 04/22/2019 4:00 PM Performed by: Bonney Aid, CRNA Pre-anesthesia Checklist: Patient identified, Emergency Drugs available, Suction available and Patient being monitored Patient Re-evaluated:Patient Re-evaluated prior to induction Oxygen Delivery Method: Circle system utilized Preoxygenation: Pre-oxygenation with 100% oxygen Induction Type: IV induction Ventilation: Mask ventilation without difficulty LMA: LMA inserted LMA Size: 3.0 Number of attempts: 1 Airway Equipment and Method: Bite block Placement Confirmation: positive ETCO2 Tube secured with: Tape Dental Injury: Teeth and Oropharynx as per pre-operative assessment

## 2019-04-22 NOTE — H&P (Signed)
Ashley Spencer is an 33 y.o. female.    Chief Complaint: Pre-OP RIGHT Ureteroscopic Stone Manipulation  HPI:  1 - RIGHT Ureteral Stone - s/p urgent Rt JJ stent (5x22 polaris) 10/24 early AM for 52mm Rt distal ureteral stone by ER CT on eval flank pain and malaise. Admitted stepdown post-op. Stone is solitary just proximal to UVJ.   2 - History of Urosepsis- fevers to 102, tachycardia, malaise UA with significant pyuria and bacteruria c/w urinary infection. Rt ureteral stone as per above 03/2019. Initial Lactate 3.2. Given Maxipeme, changed to rocephin 10/24. UCX/BCX e.coli / sens rocephin, keflex. Lactate cleared by 10/26 and discharged on continued PO ABX course.   Today "Ashley Spencer" is seen to proceed with RIGHT ureteroscopic stone manipulation / stent exchagnge for right distal stone. She has stent in situ, no interval fevers, and has been on CX-specidfic ABX prior to today.    Past Medical History:  Diagnosis Date  . Anemia   . Arthritis   . Headache   . History of pyelonephritis 2018  . History of sepsis    04-04-2019  urosepsis due to obstructive right ureteral stone  . History of trichomonal vaginitis   . Nocturia   . Right ureteral stone     Past Surgical History:  Procedure Laterality Date  . CYSTOSCOPY W/ URETERAL STENT PLACEMENT Right 04/04/2019   Procedure: CYSTOSCOPY WITH RETROGRADE PYELOGRAM/URETERAL STENT PLACEMENT;  Surgeon: Alexis Frock, MD;  Location: [facility];  Service: Urology;  Laterality: Right;    Family History  Problem Relation Age of Onset  . Migraines Father 30  . Hypertension Brother   . Hypertension Maternal Aunt   . Hypertension Maternal Uncle   . Hypertension Maternal Grandmother    Social History:  reports that she quit smoking about 2 weeks ago. Her smoking use included cigarettes. She has a 3.00 pack-year smoking history. She has never used smokeless tobacco. She reports that she does not drink alcohol or use drugs.  Allergies: No Known  Allergies  No medications prior to admission.    Results for orders placed or performed during the hospital encounter of 04/20/19 (from the past 48 hour(s))  SARS CORONAVIRUS 2 (TAT 6-24 HRS) Nasopharyngeal Nasopharyngeal Swab     Status: None   Collection Time: 04/20/19  9:47 AM   Specimen: Nasopharyngeal Swab  Result Value Ref Range   SARS Coronavirus 2 NEGATIVE NEGATIVE    Comment: (NOTE) SARS-CoV-2 target nucleic acids are NOT DETECTED. The SARS-CoV-2 RNA is generally detectable in upper and lower respiratory specimens during the acute phase of infection. Negative results do not preclude SARS-CoV-2 infection, do not rule out co-infections with other pathogens, and should not be used as the sole basis for treatment or other patient management decisions. Negative results must be combined with clinical observations, patient history, and epidemiological information. The expected result is Negative. Fact Sheet for Patients: SugarRoll.be Fact Sheet for Healthcare Providers: https://www.woods-mathews.com/ This test is not yet approved or cleared by the [place] FDA and  has been authorized for detection and/or diagnosis of SARS-CoV-2 by FDA under an Emergency Use Authorization (EUA). This EUA will remain  in effect (meaning this test can be used) for the duration of the COVID-19 declaration under Section 56 4(b)(1) of the Act, 21 U.S.C. section 360bbb-3(b)(1), unless the authorization is terminated or revoked sooner. Performed at [facility], [place] [address], [place], [place] [address]    No results found.  Review of Systems  Constitutional: Negative.  Negative for chills and fever.  HENT: Negative.   Eyes: Negative.   Respiratory: Negative.   Cardiovascular: Negative.   Gastrointestinal: Negative.   Genitourinary: Positive for urgency.  Musculoskeletal: Negative.   Skin: Negative.   Neurological: Negative.    Endo/Heme/Allergies: Negative.   Psychiatric/Behavioral: Negative.     Last menstrual period 04/05/2019. Physical Exam  Constitutional: She appears well-developed.  HENT:  Head: Normocephalic.  Eyes: Pupils are equal, round, and reactive to light.  Neck: Normal range of motion.  Cardiovascular: Normal rate.  Respiratory: Effort normal.  Genitourinary:    Genitourinary Comments: NO CVAT at present.    Musculoskeletal: Normal range of motion.  Neurological: She is alert.  Skin: Skin is warm.     Assessment/Plan  Proceed as planned with RIGHT ureteroscopic stone manipulation / stent exchange with goal of stone free. RIsks, benefits, alternatives, expected peri-op course discussed previously and reiterated today.   Alexis Frock, MD 04/22/2019, 8:21 AM

## 2019-04-22 NOTE — Discharge Instructions (Signed)
1 - You may have urinary urgency (bladder spasms) and bloody urine on / off with stent in place. This is normal.  2 - Remove tethered stent on Friday morning at home by pulling on string, then blue-white plastic tubing, and discarding. Office is open Friday if problems arise.   3 - Call MD or go to ER for fever >102, severe pain / nausea / vomiting not relieved by medications, or acute change in medical status  [facility] [phone] Post Ureteroscopy With or Without Stent Instructions  Definitions:  Ureter: The duct that transports urine from the kidney to the bladder. Stent:   A plastic hollow tube that is placed into the ureter, from the kidney to the                 bladder to prevent the ureter from swelling shut.  GENERAL INSTRUCTIONS:  Despite the fact that no skin incisions were used, the area around the ureter and bladder is raw and irritated. The stent is a foreign body which will further irritate the bladder wall. This irritation is manifested by increased frequency of urination, both day and night, and by an increase in the urge to urinate. In some, the urge to urinate is present almost always. Sometimes the urge is strong enough that you may not be able to stop yourself from urinating. The only real cure is to remove the stent and then give time for the bladder wall to heal which can't be done until the danger of the ureter swelling shut has passed, which varies.  You may see some blood in your urine while the stent is in place and a few days afterwards. Do not be alarmed, even if the urine was clear for a while. Get off your feet and drink lots of fluids until clearing occurs. If you start to pass clots or don't improve, call us.  DIET: You may return to your normal diet immediately. Because of the raw surface of your bladder, alcohol, spicy foods, acid type foods and drinks with caffeine may cause irritation or frequency and should be used in moderation. To  keep your urine flowing freely and to avoid constipation, drink plenty of fluids during the day ( 8-10 glasses ). Tip: Avoid cranberry juice because it is very acidic.  ACTIVITY: Your physical activity doesn't need to be restricted. However, if you are very active, you may see some blood in your urine. We suggest that you reduce your activity under these circumstances until the bleeding has stopped.  BOWELS: It is important to keep your bowels regular during the postoperative period. Straining with bowel movements can cause bleeding. A bowel movement every other day is reasonable. Use a mild laxative if needed, such as Milk of Magnesia 2-3 tablespoons, or 2 Dulcolax tablets. Call if you continue to have problems. If you have been taking narcotics for pain, before, during or after your surgery, you may be constipated. Take a laxative if necessary.   MEDICATION: You should resume your pre-surgery medications unless told not to. In addition you will often be given an antibiotic to prevent infection. These should be taken as prescribed until the bottles are finished unless you are having an unusual reaction to one of the drugs.  PROBLEMS YOU SHOULD REPORT TO [place]:  Fevers over 100.5 Fahrenheit.  Heavy bleeding, or clots ( See above notes about blood in urine ).  Inability to urinate.  Drug reactions ( hives, rash, nausea, vomiting, diarrhea ).  Severe  burning or pain with urination that is not improving.  FOLLOW-UP: You will need a follow-up appointment to monitor your progress. Call for this appointment at the number listed above. Usually the first appointment will be about three to fourteen days after your surgery.      Post Anesthesia Home Care Instructions  Activity: Get plenty of rest for the remainder of the day. A responsible adult should stay with you for 24 hours following the procedure.  For the next 24 hours, DO NOT: -Drive a car -Paediatric nurse -Drink alcoholic  beverages -Take any medication unless instructed by your physician -Make any legal decisions or sign important papers.  Meals: Start with liquid foods such as gelatin or soup. Progress to regular foods as tolerated. Avoid greasy, spicy, heavy foods. If nausea and/or vomiting occur, drink only clear liquids until the nausea and/or vomiting subsides. Call your physician if vomiting continues.  Special Instructions/Symptoms: Your throat may feel dry or sore from the anesthesia or the breathing tube placed in your throat during surgery. If this causes discomfort, gargle with warm salt water. The discomfort should disappear within 24 hours.  If you had a scopolamine patch placed behind your ear for the management of post- operative nausea and/or vomiting:  1. The medication in the patch is effective for 72 hours, after which it should be removed.  Wrap patch in a tissue and discard in the trash. Wash hands thoroughly with soap and water. 2. You may remove the patch earlier than 72 hours if you experience unpleasant side effects which may include dry mouth, dizziness or visual disturbances. 3. Avoid touching the patch. Wash your hands with soap and water after contact with the patch.

## 2019-04-22 NOTE — Anesthesia Preprocedure Evaluation (Addendum)
Anesthesia Evaluation  Patient identified by MRN, date of birth, ID band Patient awake    Reviewed: Allergy & Precautions, NPO status , Patient's Chart, lab work & pertinent test results  Airway Mallampati: I  TM Distance: >3 FB Neck ROM: Full    Dental no notable dental hx. (+) Teeth Intact, Chipped,    Pulmonary neg pulmonary ROS, Current Smoker, former smoker,    Pulmonary exam normal breath sounds clear to auscultation       Cardiovascular negative cardio ROS Normal cardiovascular exam Rhythm:Regular Rate:Normal     Neuro/Psych negative neurological ROS  negative psych ROS   GI/Hepatic negative GI ROS, Neg liver ROS,   Endo/Other  negative endocrine ROS  Renal/GU negative Renal ROS  negative genitourinary   Musculoskeletal negative musculoskeletal ROS (+)   Abdominal   Peds negative pediatric ROS (+)  Hematology negative hematology ROS (+)   Anesthesia Other Findings   Reproductive/Obstetrics negative OB ROS                            Anesthesia Physical  Anesthesia Plan  ASA: II  Anesthesia Plan: General   Post-op Pain Management:    Induction: Intravenous  PONV Risk Score and Plan: 3 and Ondansetron, Dexamethasone, Treatment may vary due to age or medical condition and Midazolam  Airway Management Planned: Oral ETT and LMA  Additional Equipment:   Intra-op Plan:   Post-operative Plan: Extubation in OR  Informed Consent: I have reviewed the patients History and Physical, chart, labs and discussed the procedure including the risks, benefits and alternatives for the proposed anesthesia with the patient or authorized representative who has indicated his/her understanding and acceptance.     Dental advisory given  Plan Discussed with: CRNA, Anesthesiologist and Surgeon  Anesthesia Plan Comments: ( )        Anesthesia Quick Evaluation

## 2019-04-23 ENCOUNTER — Encounter (HOSPITAL_BASED_OUTPATIENT_CLINIC_OR_DEPARTMENT_OTHER): Payer: Self-pay | Admitting: Urology

## 2019-04-23 NOTE — Anesthesia Postprocedure Evaluation (Signed)
Anesthesia Post Note  Patient: Ashley Spencer  Procedure(s) Performed: CYSTOSCOPY WITH RETROGRADE PYELOGRAM, DIAGNOSTIC URETEROSCOPY AND STENT EXCHANGE (Right )     Patient location during evaluation: PACU Anesthesia Type: General Level of consciousness: awake and alert Pain management: pain level controlled Vital Signs Assessment: post-procedure vital signs reviewed and stable Respiratory status: spontaneous breathing, nonlabored ventilation, respiratory function stable and patient connected to nasal cannula oxygen Cardiovascular status: blood pressure returned to baseline and stable Postop Assessment: no apparent nausea or vomiting Anesthetic complications: no    Last Vitals:  Vitals:   04/22/19 1700 04/22/19 1800  BP: 115/79 116/75  Pulse: 67 75  Resp: (!) 21 16  Temp:  37.3 C  SpO2: 100% 100%    Last Pain:  Vitals:   04/22/19 1800  TempSrc:   PainSc: 0-No pain                 Nalayah Hitt

## 2019-04-24 NOTE — Op Note (Signed)
NAME: Ashley Spencer, MIESNER MEDICAL RECORD [id number] ACCOUNT [id number] DATE OF BIRTH:Sep 13, 1985 FACILITY: [facility] LOCATION:  PHYSICIAN:Aoife Bold, MD  OPERATIVE REPORT  DATE OF PROCEDURE:  04/22/2019  PREOPERATIVE DIAGNOSES:  Right distal ureteral stone, history of urosepsis.  POSTOPERATIVE DIAGNOSES:  Right distal ureteral stone, history of urosepsis, status post resolution of stone.  PROCEDURE: 1.  Cystoscopy, right retrograde pyelogram, interpretation. 2.  Right diagnostic ureteroscopy. 3.  Exchange of right ureteral stent 5 x 22 Polaris with tether.  ESTIMATED BLOOD LOSS:  Nil.  COMPLICATIONS:  None.  SPECIMENS:  None.  INDICATIONS:  The patient is a pleasant 33 year old woman who had an episode of a septic obstructing stone last month.  She underwent stenting as a temporizing measure and admission for intravenous antibiotics and even required ICU admission briefly  given her significant infectious parameters.  She has since cleared infectious parameters, has been on culture specific antibiotics, now presents for definitive stone management with ureteroscopy.  She reports no obvious interval stone passage.  Informed  consent was obtained and placed in medical record.  DESCRIPTION OF PROCEDURE:  Patient being identified, procedure being right ureteroscopic stimulation confirmed, procedure timeout was performed.  Antibiotics administered.  General anesthesia introduced.  The patient was placed into a low lithotomy  position, sterile field was created prepped and draped the patient's vagina, introitus and proximal thighs using iodine.  Cystourethroscopy was performed with a 22-French rigid cystoscope with offset lens.  Inspection of urinary bladder revealed distal  end of right ureteral stent in situ.  It was grasped and brought to the level of the urethral meatus using 0.03 ZIPwire was then delivered pole and the stent was exchanged for open-ended catheter and right retrograde  pyelogram was obtained.  Right retrograde pyelogram reveals single right ureter single system right kidney.  No filling defects, narrowing or hydronephrosis were noted.  The ZIPwire was once again advanced.  An 8-French feeding tube was placed in the urinary bladder for pressure  release, and semirigid ureteroscopy for the entire length right ureter alongside a sepsis working wire.  In the distal ureter the area of the ureterovesical junction there was some mucosal edema at the site of likely prior stone impaction.  However,  there was no obvious stone material here seen whatsoever.  As the goal today was to verify stone free and the patient did have a retrograde positioned stent placed previously, a single channel digital ureteroscope was advanced over the sensor working  wire to the level of the kidney and flexible digital ureteroscopy was performed all calices x3.  No evidence of intraluminal stone was seen whatsoever.  The entire length of the ureter was once again inspected and again there was some mucosal edema at  the area of the ureterovesical junction consistent with likely interval passage of stone, but no stone material seen.  This was quite favorable given the persistence of mucosal edema and the patient's history of prior urosepsis, it was felt that brief  interval stenting with tethered stent would be warranted.  As such, a new 5 x 22 Polaris-type stent was placed using safety wire using fluoroscopic guidance.  Good proximal and distal plane were noted.  Tether was left in place and tucked.  Anesthesia  terminated.  The patient tolerated the procedure well.  No immediate complications.  The patient taken to postanesthesia care in stable condition with plan for discharge home.  CN/NUANCE  D:04/22/2019 T:04/23/2019 JOB:[id number]

## 2019-07-28 DIAGNOSIS — F17200 Nicotine dependence, unspecified, uncomplicated: Secondary | ICD-10-CM | POA: Diagnosis not present

## 2019-07-29 DIAGNOSIS — F17200 Nicotine dependence, unspecified, uncomplicated: Secondary | ICD-10-CM | POA: Diagnosis not present

## 2019-07-30 DIAGNOSIS — F17200 Nicotine dependence, unspecified, uncomplicated: Secondary | ICD-10-CM | POA: Diagnosis not present

## 2019-08-05 DIAGNOSIS — F17200 Nicotine dependence, unspecified, uncomplicated: Secondary | ICD-10-CM | POA: Diagnosis not present

## 2019-08-06 DIAGNOSIS — F17200 Nicotine dependence, unspecified, uncomplicated: Secondary | ICD-10-CM | POA: Diagnosis not present

## 2019-08-07 DIAGNOSIS — F17200 Nicotine dependence, unspecified, uncomplicated: Secondary | ICD-10-CM | POA: Diagnosis not present

## 2019-09-23 DIAGNOSIS — F17200 Nicotine dependence, unspecified, uncomplicated: Secondary | ICD-10-CM | POA: Diagnosis not present

## 2019-09-24 DIAGNOSIS — F17200 Nicotine dependence, unspecified, uncomplicated: Secondary | ICD-10-CM | POA: Diagnosis not present

## 2019-11-03 ENCOUNTER — Ambulatory Visit: Payer: Medicaid Other | Admitting: Internal Medicine

## 2019-12-03 ENCOUNTER — Ambulatory Visit: Payer: Medicaid Other | Attending: Internal Medicine | Admitting: Internal Medicine

## 2019-12-03 ENCOUNTER — Encounter: Payer: Self-pay | Admitting: Internal Medicine

## 2019-12-03 ENCOUNTER — Other Ambulatory Visit: Payer: Self-pay

## 2019-12-03 VITALS — BP 123/75 | HR 60 | Temp 97.9°F | Resp 16 | Wt 115.6 lb

## 2019-12-03 DIAGNOSIS — Z79899 Other long term (current) drug therapy: Secondary | ICD-10-CM | POA: Insufficient documentation

## 2019-12-03 DIAGNOSIS — N938 Other specified abnormal uterine and vaginal bleeding: Secondary | ICD-10-CM | POA: Insufficient documentation

## 2019-12-03 DIAGNOSIS — Z87891 Personal history of nicotine dependence: Secondary | ICD-10-CM | POA: Insufficient documentation

## 2019-12-03 DIAGNOSIS — N946 Dysmenorrhea, unspecified: Secondary | ICD-10-CM | POA: Insufficient documentation

## 2019-12-03 LAB — POCT URINE PREGNANCY: Preg Test, Ur: NEGATIVE

## 2019-12-03 NOTE — Progress Notes (Signed)
Patient ID: Ashley Spencer, female    DOB: 1985/06/21  MRN: [id number]  CC: Menstrual Problem   Subjective: Ashley Spencer is a 34 y.o. female who presents for urgent care visit for menstrual abnormality Her concerns today include:  Tobacco dependence, ureteral stone  Patient presents today complaining of abnormal vaginal bleeding pattern.  She was started on Depo in May of last year.  She got 1 shot and never came back for any other.  She reports that her menstrual cycles have been normal usually lasting about 5 days with heavy bleeding the first 2 days and then it tapers off.  She has mild cramps. -However last month she bled from the beginning of May until the 29th of the month.  Menses stopped and then restarted again on the 6/16/201 and has continued until now.  Current bleeding is light and trailing off.  - Bleeding for the month of May was very heavy to the point where she changed pads about every 45 minutes.  Endorsed large clots and severe cramps.  She is sexually active.  She is not on any method of birth control.  Last sexual activity was on the second of this month.  Patient Active Problem List   Diagnosis Date Noted  . Ureteral stone 04/04/2019     Current Outpatient Medications on File Prior to Visit  Medication Sig Dispense Refill  . ketorolac (TORADOL) 10 MG tablet Take 1 tablet (10 mg total) by mouth every 8 (eight) hours as needed for moderate pain. Or stent discomfort post-operatively. 20 tablet 0  . ondansetron (ZOFRAN ODT) 4 MG disintegrating tablet Take 1 tablet (4 mg total) by mouth every 8 (eight) hours as needed for nausea or vomiting. 20 tablet 0  . traMADol (ULTRAM) 50 MG tablet Take 1-2 tablets (50-100 mg total) by mouth every 6 (six) hours as needed for severe pain. Post-operatively 15 tablet 0   No current facility-administered medications on file prior to visit.    No Known Allergies  Social History   Socioeconomic History  . Marital status: Single     Spouse name: Not on file  . Number of children: 3  . Years of education: 11th  . Highest education level: Not on file  Occupational History    Employer: OTHER    Comment: n/a  Tobacco Use  . Smoking status: Former Smoker    Packs/day: 1.00    Years: 3.00    Pack years: 3.00    Types: Cigarettes    Quit date: 04/03/2019    Years since quitting: 0.6  . Smokeless tobacco: Never Used  Vaping Use  . Vaping Use: Never used  Substance and Sexual Activity  . Alcohol use: No  . Drug use: No  . Sexual activity: Yes    Birth control/protection: None  Other Topics Concern  . Not on file  Social History Narrative   Patient lives at home with her grandmother.   Caffeine Use: 1 cup every other morning   Social Determinants of Health   Financial Resource Strain:   . Difficulty of Paying Living Expenses:   Food Insecurity:   . Worried About Charity fundraiser in the Last Year:   . Arboriculturist in the Last Year:   Transportation Needs:   . Film/video editor (Medical):   Marland Kitchen Lack of Transportation (Non-Medical):   Physical Activity:   . Days of Exercise per Week:   . Minutes of Exercise per Session:  Stress:   . Feeling of Stress :   Social Connections:   . Frequency of Communication with Friends and Family:   . Frequency of Social Gatherings with Friends and Family:   . Attends Religious Services:   . Active Member of Clubs or Organizations:   . Attends Archivist Meetings:   Marland Kitchen Marital Status:   Intimate Partner Violence:   . Fear of Current or Ex-Partner:   . Emotionally Abused:   Marland Kitchen Physically Abused:   . Sexually Abused:     Family History  Problem Relation Age of Onset  . Migraines Father 57  . Hypertension Brother   . Hypertension Maternal Aunt   . Hypertension Maternal Uncle   . Hypertension Maternal Grandmother     Past Surgical History:  Procedure Laterality Date  . CYSTOSCOPY W/ URETERAL STENT PLACEMENT Right 04/04/2019   Procedure:  CYSTOSCOPY WITH RETROGRADE PYELOGRAM/URETERAL STENT PLACEMENT;  Surgeon: Alexis Frock, MD;  Location: [facility];  Service: Urology;  Laterality: Right;  . CYSTOSCOPY WITH RETROGRADE PYELOGRAM, URETEROSCOPY AND STENT PLACEMENT Right 04/22/2019   Procedure: CYSTOSCOPY WITH RETROGRADE PYELOGRAM, DIAGNOSTIC URETEROSCOPY AND STENT EXCHANGE;  Surgeon: Alexis Frock, MD;  Location: [facility];  Service: Urology;  Laterality: Right;  60 MINS    ROS: Review of Systems Negative except as stated above  PHYSICAL EXAM: BP 123/75   Pulse 60   Temp 97.9 F (36.6 C)   Resp 16   Wt 115 lb 9.6 oz (52.4 kg)   SpO2 100%   BMI 22.58 kg/m   Physical Exam  General appearance - alert, well appearing, and in no distress Mental status - normal mood, behavior, speech, dress, motor activity, and thought processes Abdomen - soft, nontender, nondistended, no masses or organomegaly   CMP Latest Ref Rng & Units 04/06/2019 04/05/2019 04/04/2019  Glucose 70 - 99 mg/dL 101(H) 140(H) 131(H)  BUN 6 - 20 mg/dL 24(H) 25(H) 25(H)  Creatinine 0.44 - 1.00 mg/dL 1.13(H) 1.38(H) 1.97(H)  Sodium 135 - 145 mmol/L 136 134(L) 129(L)  Potassium 3.5 - 5.1 mmol/L 3.7 4.0 3.0(L)  Chloride 98 - 111 mmol/L 114(H) 112(H) 103  CO2 22 - 32 mmol/L 15(L) 15(L) 13(L)  Calcium 8.9 - 10.3 mg/dL 8.0(L) 8.2(L) 6.8(L)  Total Protein 6.5 - 8.1 g/dL - - -  Total Bilirubin 0.3 - 1.2 mg/dL - - -  Alkaline Phos 38 - 126 U/L - - -  AST 15 - 41 U/L - - -  ALT 0 - 44 U/L - - -   Lipid Panel  No results found for: CHOL, TRIG, HDL, CHOLHDL, VLDL, LDLCALC, LDLDIRECT  CBC    Component Value Date/Time   WBC 17.7 (H) 04/07/2019 0133   RBC 3.80 (L) 04/07/2019 0133   HGB 10.6 (L) 04/07/2019 0133   HCT 31.7 (L) 04/07/2019 0133   PLT 119 (L) 04/07/2019 0133   MCV 83.4 04/07/2019 0133   MCH 27.9 04/07/2019 0133   MCHC 33.4 04/07/2019 0133   RDW 14.6 04/07/2019 0133   LYMPHSABS 1.1 04/05/2019 0604   MONOABS 1.5 (H) 04/05/2019  0604   EOSABS 0.0 04/05/2019 0604   BASOSABS 0.1 04/05/2019 0604   Results for orders placed or performed in visit on 12/03/19  POCT urine pregnancy  Result Value Ref Range   Preg Test, Ur Negative Negative    ASSESSMENT AND PLAN:  1. Dysfunctional uterine bleeding -She is overdue for a Pap smear but currently having some vaginal bleeding so we will have  her come back in about 3 weeks to have her Pap.  Check vaginal ultrasound.  Refer to GYN. - CBC - POCT urine pregnancy - US Pelvic Complete With Transvaginal; Future - Ambulatory referral to Gynecology  2. Dysmenorrhea Recommend using ibuprofen 200 mg and taking 4 tablets every 8 hours as needed - US Pelvic Complete With Transvaginal; Future - Ambulatory referral to Gynecology    Patient was given the opportunity to ask questions.  Patient verbalized understanding of the plan and was able to repeat key elements of the plan.   Orders Placed This Encounter  Procedures  . US Pelvic Complete With Transvaginal  . CBC  . Ambulatory referral to Gynecology  . POCT urine pregnancy     Requested Prescriptions    No prescriptions requested or ordered in this encounter    Return in about 3 weeks (around 12/24/2019) for PAP with me or Amy.  Karle Plumber, MD, FACP

## 2019-12-03 NOTE — Patient Instructions (Signed)
I have referred you for a pelvic ultrasound and to see the gynecologist. You can take ibuprofen 200 mg 4 tablets every 8 hours as needed whenever you have menstrual cramps. Please keep a log of the dates that your periods begin and ends.

## 2019-12-04 LAB — CBC
Hematocrit: 36.3 % (ref 34.0–46.6)
Hemoglobin: 11.9 g/dL (ref 11.1–15.9)
MCH: 27.6 pg (ref 26.6–33.0)
MCHC: 32.8 g/dL (ref 31.5–35.7)
MCV: 84 fL (ref 79–97)
Platelets: 345 10*3/uL (ref 150–450)
RBC: 4.31 x10E6/uL (ref 3.77–5.28)
RDW: 13.1 % (ref 11.7–15.4)
WBC: 7 10*3/uL (ref 3.4–10.8)

## 2019-12-16 ENCOUNTER — Telehealth: Payer: Self-pay

## 2019-12-16 NOTE — Telephone Encounter (Signed)
Contacted pt to go over lab results pt is aware and doesn't have any questions or concerns 

## 2019-12-21 ENCOUNTER — Ambulatory Visit (HOSPITAL_COMMUNITY): Payer: Medicaid Other

## 2019-12-22 ENCOUNTER — Telehealth: Payer: Self-pay | Admitting: Internal Medicine

## 2019-12-22 NOTE — Telephone Encounter (Signed)
Copied from [place] [id number]. Topic: General - Other >> Dec 21, 2019  1:56 PM Rainey Pines A wrote: Patient wants a callback as soon as possible today .She is unable to make her appointment at 2pm today due to transportation,for the pelvic scan that was scheduled at [facility] today and wants to know if it can be rescheduled for her or if she can have the scan done at the office during her appt on Friday .Please advise

## 2019-12-22 NOTE — Telephone Encounter (Signed)
Returned pt call and made aware that her [place] was scheduled for yesterday. Made pt aware that she can call and reschedule her appointment. Provided her the number. Pt doesn't have any questions or concerns

## 2019-12-25 ENCOUNTER — Ambulatory Visit (HOSPITAL_BASED_OUTPATIENT_CLINIC_OR_DEPARTMENT_OTHER): Payer: Medicaid Other | Admitting: Family

## 2019-12-25 DIAGNOSIS — Z5329 Procedure and treatment not carried out because of patient's decision for other reasons: Secondary | ICD-10-CM

## 2019-12-25 NOTE — Progress Notes (Signed)
Patient did not show for appointment.   

## 2020-01-18 ENCOUNTER — Telehealth (HOSPITAL_COMMUNITY): Payer: Self-pay | Admitting: Emergency Medicine

## 2020-01-18 ENCOUNTER — Ambulatory Visit (HOSPITAL_COMMUNITY)
Admission: EM | Admit: 2020-01-18 | Discharge: 2020-01-18 | Disposition: A | Discharge status: Home / Self Care | Payer: Medicaid Other | Attending: Family Medicine | Admitting: Family Medicine

## 2020-01-18 ENCOUNTER — Encounter (HOSPITAL_COMMUNITY): Payer: Self-pay

## 2020-01-18 ENCOUNTER — Other Ambulatory Visit: Payer: Self-pay

## 2020-01-18 DIAGNOSIS — K047 Periapical abscess without sinus: Secondary | ICD-10-CM

## 2020-01-18 DIAGNOSIS — K0889 Other specified disorders of teeth and supporting structures: Secondary | ICD-10-CM

## 2020-01-18 MED ORDER — AMOXICILLIN 875 MG PO TABS: 875.0000 mg | ORAL_TABLET | Freq: Two times a day (BID) | ORAL | 0 refills | Status: DC

## 2020-01-18 MED ORDER — LIDOCAINE VISCOUS HCL 2 % MT SOLN: 15.0000 mL | OROMUCOSAL | 0 refills | Status: DC | PRN

## 2020-01-18 MED ORDER — IBUPROFEN 800 MG PO TABS: 800.0000 mg | ORAL_TABLET | Freq: Three times a day (TID) | ORAL | 0 refills | Status: DC

## 2020-01-18 NOTE — ED Provider Notes (Signed)
[place]    CSN: [id number] Arrival date & time: 01/18/20  1126      History   Chief Complaint Chief Complaint  Patient presents with  . Dental Pain    HPI Ashley Spencer is a 34 y.o. female.   HPI  Patient presents for evaluation of right facial pain x 3 days.  Endorses broken teeth and no recent oral care. Denies fever. Endorses mild facial pain. She has not taken any medication for pain nor does she have follow-up with dentist.  Past Medical History:  Diagnosis Date  . Anemia   . Arthritis   . Headache   . History of pyelonephritis 2018  . History of sepsis    04-04-2019  urosepsis due to obstructive right ureteral stone  . History of trichomonal vaginitis   . Nocturia   . Right ureteral stone     Patient Active Problem List   Diagnosis Date Noted  . Ureteral stone 04/04/2019    Past Surgical History:  Procedure Laterality Date  . CYSTOSCOPY W/ URETERAL STENT PLACEMENT Right 04/04/2019   Procedure: CYSTOSCOPY WITH RETROGRADE PYELOGRAM/URETERAL STENT PLACEMENT;  Surgeon: Alexis Frock, MD;  Location: [facility];  Service: Urology;  Laterality: Right;  . CYSTOSCOPY WITH RETROGRADE PYELOGRAM, URETEROSCOPY AND STENT PLACEMENT Right 04/22/2019   Procedure: CYSTOSCOPY WITH RETROGRADE PYELOGRAM, DIAGNOSTIC URETEROSCOPY AND STENT EXCHANGE;  Surgeon: Alexis Frock, MD;  Location: [facility];  Service: Urology;  Laterality: Right;  60 MINS    OB History    Gravida  5   Para  3   Term  3   Preterm      AB  2   Living  3     SAB      TAB  2   Ectopic      Multiple      Live Births               Home Medications    Prior to Admission medications   Medication Sig Start Date End Date Taking? Authorizing Provider  ketorolac (TORADOL) 10 MG tablet Take 1 tablet (10 mg total) by mouth every 8 (eight) hours as needed for moderate pain. Or stent discomfort post-operatively. 04/22/19   Alexis Frock, MD  ondansetron  (ZOFRAN ODT) 4 MG disintegrating tablet Take 1 tablet (4 mg total) by mouth every 8 (eight) hours as needed for nausea or vomiting. 04/02/19   Fransico Meadow, PA-C  traMADol (ULTRAM) 50 MG tablet Take 1-2 tablets (50-100 mg total) by mouth every 6 (six) hours as needed for severe pain. Post-operatively 04/22/19   Alexis Frock, MD    Family History Family History  Problem Relation Age of Onset  . Migraines Father 72  . Hypertension Brother   . Hypertension Maternal Aunt   . Hypertension Maternal Uncle   . Hypertension Maternal Grandmother     Social History Social History   Tobacco Use  . Smoking status: Former Smoker    Packs/day: 1.00    Years: 3.00    Pack years: 3.00    Types: Cigarettes    Quit date: 04/03/2019    Years since quitting: 0.7  . Smokeless tobacco: Never Used  Vaping Use  . Vaping Use: Never used  Substance Use Topics  . Alcohol use: No  . Drug use: No     Allergies   Patient has no known allergies.   Review of Systems Review of Systems Pertinent negatives listed in HPI  Physical  Exam Triage Vital Signs ED Triage Vitals  Enc Vitals Group     BP 01/18/20 1345 131/87     Pulse Rate 01/18/20 1345 (!) 58     Resp 01/18/20 1345 18     Temp 01/18/20 1345 98.4 F (36.9 C)     Temp src --      SpO2 01/18/20 1345 100 %     Weight --      Height --      Head Circumference --      Peak Flow --      Pain Score 01/18/20 1344 7     Pain Loc --      Pain Edu? --      Excl. in [place]? --    No data found.  Updated Vital Signs BP 131/87   Pulse (!) 58   Temp 98.4 F (36.9 C)   Resp 18   LMP 01/08/2020   SpO2 100%   Visual Acuity Right Eye Distance:   Left Eye Distance:   Bilateral Distance:    Right Eye Near:   Left Eye Near:    Bilateral Near:     Physical Exam HENT:     Mouth/Throat:     Dentition: Abnormal dentition. Dental tenderness and dental caries present.     Comments: Broken molar upper right middle. Cardiovascular:      Rate and Rhythm: Normal rate.  Pulmonary:     Effort: Pulmonary effort is normal.  Neurological:     Mental Status: She is alert.      UC Treatments / Results  Labs (all labs ordered are listed, but only abnormal results are displayed) Labs Reviewed - No data to display  EKG   Radiology No results found.  Procedures Procedures (including critical care time)  Medications Ordered in UC Medications - No data to display  Initial Impression / Assessment and Plan / UC Course  I have reviewed the triage vital signs and the nursing notes.  Pertinent labs & imaging results that were available during my care of the patient were reviewed by me and considered in my medical decision making (see chart for details).    Antibiotic prescribed for dental infection.  Ibuprofen and lidocaine viscous prescribed for pain.  Patient advised to take as directed.  Patient given information to follow-up with urgent tooth for evaluation and management of chronic oral dentition problems. Final Clinical Impressions(s) / UC Diagnoses   Final diagnoses:  Dental infection  Pain, dental     Discharge Instructions     Follow-up at [facility]  [address] [place], [place] [address] For further evaluation of tooth pain.     ED Prescriptions    Medication Sig Dispense Auth. Provider   lidocaine (XYLOCAINE) 2 % solution Use as directed 15 mLs in the mouth or throat as needed for mouth pain (mix with warm salt water, gargle and spit). 100 mL Scot Jun, FNP   amoxicillin (AMOXIL) 875 MG tablet Take 1 tablet (875 mg total) by mouth 2 (two) times daily. 20 tablet Scot Jun, FNP   ibuprofen (ADVIL) 800 MG tablet Take 1 tablet (800 mg total) by mouth 3 (three) times daily. 21 tablet Scot Jun, FNP     PDMP not reviewed this encounter.   Scot Jun, FNP 01/18/20 1426

## 2020-01-18 NOTE — Discharge Instructions (Addendum)
Follow-up at [facility]  [address] [place], [place] [id number] For further evaluation of tooth pain.

## 2020-01-18 NOTE — ED Triage Notes (Addendum)
Pt presents with complaints of upper right tooth pain. Reports pain x 3 days. States the tooth has a hole in it and pain is spreading up into her ear. Pt has not been seen at the dentist for it. Pt has had tylenol

## 2020-01-22 ENCOUNTER — Encounter (HOSPITAL_COMMUNITY): Payer: Self-pay

## 2020-01-22 ENCOUNTER — Other Ambulatory Visit: Payer: Self-pay

## 2020-01-22 ENCOUNTER — Emergency Department (HOSPITAL_COMMUNITY)
Admission: EM | Admit: 2020-01-22 | Discharge: 2020-01-22 | Disposition: A | Discharge status: Home / Self Care | Payer: Medicaid Other | Attending: Emergency Medicine | Admitting: Emergency Medicine

## 2020-01-22 DIAGNOSIS — Z87891 Personal history of nicotine dependence: Secondary | ICD-10-CM | POA: Diagnosis not present

## 2020-01-22 DIAGNOSIS — R220 Localized swelling, mass and lump, head: Secondary | ICD-10-CM | POA: Diagnosis not present

## 2020-01-22 DIAGNOSIS — K047 Periapical abscess without sinus: Secondary | ICD-10-CM | POA: Diagnosis not present

## 2020-01-22 DIAGNOSIS — K0889 Other specified disorders of teeth and supporting structures: Secondary | ICD-10-CM | POA: Diagnosis present

## 2020-01-22 MED ORDER — AMOXICILLIN-POT CLAVULANATE 875-125 MG PO TABS: 1.0000 | ORAL_TABLET | Freq: Two times a day (BID) | ORAL | 0 refills | Status: DC

## 2020-01-22 MED ORDER — BENZOCAINE 20 % MT GEL: 1.0000 "application " | Freq: Four times a day (QID) | OROMUCOSAL | 0 refills | Status: DC | PRN

## 2020-01-22 MED ORDER — ACETAMINOPHEN 500 MG PO TABS
1000.0000 mg | ORAL_TABLET | Freq: Once | ORAL | Status: AC
  Administered 2020-01-22: 1000 mg via ORAL
  Filled 2020-01-22: qty 2

## 2020-01-22 MED ORDER — IBUPROFEN 800 MG PO TABS
800.0000 mg | ORAL_TABLET | Freq: Once | ORAL | Status: AC
  Administered 2020-01-22: 800 mg via ORAL
  Filled 2020-01-22: qty 1

## 2020-01-22 MED ORDER — AMOXICILLIN-POT CLAVULANATE 875-125 MG PO TABS
1.0000 | ORAL_TABLET | Freq: Once | ORAL | Status: AC
  Administered 2020-01-22: 1 via ORAL
  Filled 2020-01-22: qty 1

## 2020-01-22 NOTE — ED Provider Notes (Signed)
[place] DEPT Provider Note   CSN: [id number] Arrival date & time: 01/22/20  1125     History Chief Complaint  Patient presents with  . Dental Pain  . Facial Swelling    Ashley Spencer is a 34 y.o. female.  Ashley Spencer is a 34 y.o. female with a history of anemia, headaches, who presents to the ED for evaluation of right lower dental pain.  Pain has been present for 3 days.  When symptoms first began she went to urgent care and was prescribed amoxicillin, ibuprofen and viscous lidocaine, she states that these have not been helping her symptoms and despite taking antibiotics as prescribed she has had worsening facial swelling.  She was referred to a dental urgent care clinic but they did not take her insurance, she has not been able to get appointment elsewhere.  She has not been using any other medications to help treat pain.  Denies any fevers or chills, no nausea or vomiting, no difficulty swallowing, no neck pain or swelling.        Past Medical History:  Diagnosis Date  . Anemia   . Arthritis   . Headache   . History of pyelonephritis 2018  . History of sepsis    04-04-2019  urosepsis due to obstructive right ureteral stone  . History of trichomonal vaginitis   . Nocturia   . Right ureteral stone     Patient Active Problem List   Diagnosis Date Noted  . Ureteral stone 04/04/2019    Past Surgical History:  Procedure Laterality Date  . CYSTOSCOPY W/ URETERAL STENT PLACEMENT Right 04/04/2019   Procedure: CYSTOSCOPY WITH RETROGRADE PYELOGRAM/URETERAL STENT PLACEMENT;  Surgeon: Alexis Frock, MD;  Location: [facility];  Service: Urology;  Laterality: Right;  . CYSTOSCOPY WITH RETROGRADE PYELOGRAM, URETEROSCOPY AND STENT PLACEMENT Right 04/22/2019   Procedure: CYSTOSCOPY WITH RETROGRADE PYELOGRAM, DIAGNOSTIC URETEROSCOPY AND STENT EXCHANGE;  Surgeon: Alexis Frock, MD;  Location: [facility];  Service: Urology;   Laterality: Right;  60 MINS     OB History    Gravida  5   Para  3   Term  3   Preterm      AB  2   Living  3     SAB      TAB  2   Ectopic      Multiple      Live Births              Family History  Problem Relation Age of Onset  . Migraines Father 19  . Hypertension Brother   . Hypertension Maternal Aunt   . Hypertension Maternal Uncle   . Hypertension Maternal Grandmother     Social History   Tobacco Use  . Smoking status: Former Smoker    Packs/day: 1.00    Years: 3.00    Pack years: 3.00    Types: Cigarettes    Quit date: 04/03/2019    Years since quitting: 0.8  . Smokeless tobacco: Never Used  Vaping Use  . Vaping Use: Never used  Substance Use Topics  . Alcohol use: No  . Drug use: No    Home Medications Prior to Admission medications   Medication Sig Start Date End Date Taking? Authorizing Provider  amoxicillin-clavulanate (AUGMENTIN) 875-125 MG tablet Take 1 tablet by mouth 2 (two) times daily. One po bid x 7 days 01/22/20   Jacqlyn Larsen, PA-C  benzocaine (HURRICAINE) 20 % GEL Use as  directed 1 application in the mouth or throat 4 (four) times daily as needed. 01/22/20   Jacqlyn Larsen, PA-C  ibuprofen (ADVIL) 800 MG tablet Take 1 tablet (800 mg total) by mouth 3 (three) times daily. 01/18/20   Scot Jun, FNP  ketorolac (TORADOL) 10 MG tablet Take 1 tablet (10 mg total) by mouth every 8 (eight) hours as needed for moderate pain. Or stent discomfort post-operatively. 04/22/19   Alexis Frock, MD  lidocaine (XYLOCAINE) 2 % solution Use as directed 15 mLs in the mouth or throat as needed for mouth pain (mix with warm salt water, gargle and spit). 01/18/20   Scot Jun, FNP  ondansetron (ZOFRAN ODT) 4 MG disintegrating tablet Take 1 tablet (4 mg total) by mouth every 8 (eight) hours as needed for nausea or vomiting. 04/02/19   Fransico Meadow, PA-C  traMADol (ULTRAM) 50 MG tablet Take 1-2 tablets (50-100 mg total) by mouth every  6 (six) hours as needed for severe pain. Post-operatively 04/22/19   Alexis Frock, MD    Allergies    Patient has no known allergies.  Review of Systems   Review of Systems  Constitutional: Negative for chills and fever.  HENT: Positive for dental problem and facial swelling. Negative for trouble swallowing and voice change.   Gastrointestinal: Negative for nausea and vomiting.  Skin: Negative for color change and rash.  Neurological: Negative for headaches.    Physical Exam Updated Vital Signs BP 113/86 (BP Location: Right Arm)   Pulse 89   Temp 98 F (36.7 C) (Oral)   Resp 16   Ht 5\' 5"  (1.651 m)   Wt 52.2 kg   LMP 01/08/2020   SpO2 100%   BMI 19.14 kg/m   Physical Exam Vitals and nursing note reviewed.  Constitutional:      General: She is not in acute distress.    Appearance: Normal appearance. She is well-developed and normal weight. She is not ill-appearing or diaphoretic.     Comments: Well-appearing and in no distress  HENT:     Head: Normocephalic and atraumatic.     Mouth/Throat:     Comments: Pain and swelling present over the right lower molars without obvious drainable abscess, teeth with decay in this area and broken tooth, swelling does not extend underneath the chin or into the neck.  Patient able to open mouth without difficulty, no trismus, tolerating secretions without difficulty, normal phonation Eyes:     General:        Right eye: No discharge.        Left eye: No discharge.  Pulmonary:     Effort: Pulmonary effort is normal. No respiratory distress.  Musculoskeletal:     Cervical back: Neck supple. No tenderness.  Skin:    General: Skin is warm and dry.  Neurological:     Mental Status: She is alert and oriented to person, place, and time.     Coordination: Coordination normal.  Psychiatric:        Mood and Affect: Mood normal.        Behavior: Behavior normal.     ED Results / Procedures / Treatments   Labs (all labs ordered are  listed, but only abnormal results are displayed) Labs Reviewed - No data to display  EKG None  Radiology No results found.  Procedures Procedures (including critical care time)  Medications Ordered in ED Medications  amoxicillin-clavulanate (AUGMENTIN) 875-125 MG per tablet 1 tablet (1 tablet Oral Given 01/22/20  1236)  ibuprofen (ADVIL) tablet 800 mg (800 mg Oral Given 01/22/20 1236)  acetaminophen (TYLENOL) tablet 1,000 mg (1,000 mg Oral Given 01/22/20 1236)    ED Course  I have reviewed the triage vital signs and the nursing notes.  Pertinent labs & imaging results that were available during my care of the patient were reviewed by me and considered in my medical decision making (see chart for details).    MDM Rules/Calculators/A&P                          Patient with toothache.  No gross abscess.  Exam unconcerning for Ludwig's angina or spread of infection.  Patient has been on amoxicillin for about 4 days now without improvement, will change antibiotics to Augmentin.  I have encouraged patient to use ibuprofen and Tylenol together for pain relief as well as benzocaine gel.  Stressed the importance of close follow-up with a dentist and provided information for on-call dentist and list of dental resources.  Patient expresses understanding and agreement, discharged home in good condition.  Final Clinical Impression(s) / ED Diagnoses Final diagnoses:  Dental infection    Rx / DC Orders ED Discharge Orders         Ordered    amoxicillin-clavulanate (AUGMENTIN) 875-125 MG tablet  2 times daily     Discontinue  Reprint     01/22/20 1251    benzocaine (HURRICAINE) 20 % GEL  4 times daily PRN     Discontinue  Reprint     01/22/20 1251           Jacqlyn Larsen, [place] 01/22/20 1306    Charlesetta Shanks, MD 02/10/20 1356

## 2020-01-22 NOTE — Discharge Instructions (Signed)
Please take entire course of antibiotics as directed.  Continue using ibuprofen (800 mg) and tylenol (1000 mg), as well as prescribed benzocaine gel for pain.  You will need to follow-up with your dentist for continued management of this.  Call the dentist on call provided or use the list of dental resources given to you today.  Return to the emergency department for fevers, swelling or pain under the tongue or in the neck, difficulty breathing or swallowing or any other new or concerning symptoms.

## 2020-01-22 NOTE — ED Triage Notes (Signed)
Patient c/o right lower dental pain and right facial swelling x 3 days. Patient states she went to the [facility] and received RX for amoxicillin, Ibuprofen, and viscous Lidocaine. Patient states her pain is not any better and today her face was more swollen.

## 2020-02-18 DIAGNOSIS — F17200 Nicotine dependence, unspecified, uncomplicated: Secondary | ICD-10-CM | POA: Diagnosis not present

## 2020-03-02 ENCOUNTER — Encounter: Payer: Medicaid Other | Admitting: Advanced Practice Midwife

## 2020-03-02 DIAGNOSIS — F17200 Nicotine dependence, unspecified, uncomplicated: Secondary | ICD-10-CM | POA: Diagnosis not present

## 2020-05-10 ENCOUNTER — Other Ambulatory Visit: Payer: Self-pay

## 2020-05-10 ENCOUNTER — Ambulatory Visit (HOSPITAL_COMMUNITY)
Admission: EM | Admit: 2020-05-10 | Discharge: 2020-05-10 | Disposition: A | Discharge status: Home / Self Care | Payer: Medicaid Other | Attending: Family Medicine | Admitting: Family Medicine

## 2020-05-10 ENCOUNTER — Encounter (HOSPITAL_COMMUNITY): Payer: Self-pay | Admitting: Emergency Medicine

## 2020-05-10 ENCOUNTER — Ambulatory Visit (INDEPENDENT_AMBULATORY_CARE_PROVIDER_SITE_OTHER): Payer: Medicaid Other

## 2020-05-10 DIAGNOSIS — M542 Cervicalgia: Secondary | ICD-10-CM

## 2020-05-10 MED ORDER — PREDNISONE 10 MG (21) PO TBPK: ORAL_TABLET | ORAL | 0 refills | Status: DC

## 2020-05-10 MED ORDER — KETOROLAC TROMETHAMINE 60 MG/2ML IM SOLN
60.0000 mg | Freq: Once | INTRAMUSCULAR | Status: AC
  Administered 2020-05-10: 60 mg via INTRAMUSCULAR

## 2020-05-10 MED ORDER — KETOROLAC TROMETHAMINE 60 MG/2ML IM SOLN
INTRAMUSCULAR | Status: AC
  Filled 2020-05-10: qty 2

## 2020-05-10 MED ORDER — CYCLOBENZAPRINE HCL 5 MG PO TABS: 5.0000 mg | ORAL_TABLET | Freq: Three times a day (TID) | ORAL | 0 refills | Status: DC | PRN

## 2020-05-10 NOTE — Discharge Instructions (Signed)
You do have some cervical spine degeneration or wear and tear on the spine. I believe you have some muscle spasms and nerve inflammation. Nothing concerning We gave you Toradol here for pain.  We will give you a neck brace to wear for comfort.  I am prescribing prednisone to take for 6 days with food and muscle relaxant as needed.  Be aware the muscle relaxant may female you drowsy.  Rest, alternate heat and ice and stretch the neck.  Follow up as needed for continued or worsening symptoms

## 2020-05-10 NOTE — ED Triage Notes (Signed)
Pt presents with neck and upper back pain xs 2-3 days. States unable to sit up from laying down without help. States does a lot of lifting at work. States OTC pain medication are not giving relief, but heating pad gave temporary pain.

## 2020-05-11 NOTE — ED Provider Notes (Signed)
[place]    CSN: [id number] Arrival date & time: 05/10/20  0908      History   Chief Complaint Chief Complaint  Patient presents with  . Neck Pain  . Back Pain    HPI Ashley Spencer is a 34 y.o. female.   Patient is a 34 year old female that presents today with neck and upper back pain for 2 3 days.  The pain has worsened.  Denies any injuries or ibuprofen.  Woke up this way.  Pain worsened from going from lying down to sitting.  Is having some associated numbness and tingling and weakness in the upper extremities.  Taking over-the-counter medicines without any relief.     Past Medical History:  Diagnosis Date  . Anemia   . Arthritis   . Headache   . History of pyelonephritis 2018  . History of sepsis    04-04-2019  urosepsis due to obstructive right ureteral stone  . History of trichomonal vaginitis   . Nocturia   . Right ureteral stone     Patient Active Problem List   Diagnosis Date Noted  . Ureteral stone 04/04/2019    Past Surgical History:  Procedure Laterality Date  . CYSTOSCOPY W/ URETERAL STENT PLACEMENT Right 04/04/2019   Procedure: CYSTOSCOPY WITH RETROGRADE PYELOGRAM/URETERAL STENT PLACEMENT;  Surgeon: Alexis Frock, MD;  Location: [facility];  Service: Urology;  Laterality: Right;  . CYSTOSCOPY WITH RETROGRADE PYELOGRAM, URETEROSCOPY AND STENT PLACEMENT Right 04/22/2019   Procedure: CYSTOSCOPY WITH RETROGRADE PYELOGRAM, DIAGNOSTIC URETEROSCOPY AND STENT EXCHANGE;  Surgeon: Alexis Frock, MD;  Location: [facility];  Service: Urology;  Laterality: Right;  60 MINS    OB History    Gravida  5   Para  3   Term  3   Preterm      AB  2   Living  3     SAB      TAB  2   Ectopic      Multiple      Live Births               Home Medications    Prior to Admission medications   Medication Sig Start Date End Date Taking? Authorizing Provider  cyclobenzaprine (FLEXERIL) 5 MG tablet Take 1 tablet (5 mg  total) by mouth 3 (three) times daily as needed for muscle spasms. 05/10/20   Lanny Donoso, Tressia Miners A, NP  predniSONE (STERAPRED UNI-PAK 21 TAB) 10 MG (21) TBPK tablet 6 tabs for 1 day, then 5 tabs for 1 das, then 4 tabs for 1 day, then 3 tabs for 1 day, 2 tabs for 1 day, then 1 tab for 1 day 05/10/20   Orvan July, NP    Family History Family History  Problem Relation Age of Onset  . Migraines Father 24  . Hypertension Brother   . Hypertension Maternal Aunt   . Hypertension Maternal Uncle   . Hypertension Maternal Grandmother     Social History Social History   Tobacco Use  . Smoking status: Former Smoker    Packs/day: 1.00    Years: 3.00    Pack years: 3.00    Types: Cigarettes    Quit date: 04/03/2019    Years since quitting: 1.1  . Smokeless tobacco: Never Used  Vaping Use  . Vaping Use: Never used  Substance Use Topics  . Alcohol use: No  . Drug use: No     Allergies   Patient has no known allergies.  Review of Systems Review of Systems   Physical Exam Triage Vital Signs ED Triage Vitals  Enc Vitals Group     BP 05/10/20 1018 (!) 133/98     Pulse Rate 05/10/20 1018 74     Resp 05/10/20 1018 18     Temp 05/10/20 1018 97.9 F (36.6 C)     Temp src --      SpO2 05/10/20 1018 100 %     Weight --      Height --      Head Circumference --      Peak Flow --      Pain Score 05/10/20 1016 8     Pain Loc --      Pain Edu? --      Excl. in [place]? --    No data found.  Updated Vital Signs BP (!) 133/98 (BP Location: Right Arm)   Pulse 74   Temp 97.9 F (36.6 C)   Resp 18   LMP 04/14/2020   SpO2 100%   Visual Acuity Right Eye Distance:   Left Eye Distance:   Bilateral Distance:    Right Eye Near:   Left Eye Near:    Bilateral Near:     Physical Exam Vitals and nursing note reviewed.  Constitutional:      General: She is not in acute distress.    Appearance: Normal appearance. She is not ill-appearing, toxic-appearing or diaphoretic.  HENT:      Head: Normocephalic.     Nose: Nose normal.     Mouth/Throat:     Pharynx: Oropharynx is clear.  Eyes:     Conjunctiva/sclera: Conjunctivae normal.  Neck:   Pulmonary:     Effort: Pulmonary effort is normal.  Musculoskeletal:     Cervical back: Pain with movement, spinous process tenderness and muscular tenderness present. Decreased range of motion.  Skin:    General: Skin is warm and dry.     Findings: No rash.  Neurological:     Mental Status: She is alert.  Psychiatric:        Mood and Affect: Mood normal.      UC Treatments / Results  Labs (all labs ordered are listed, but only abnormal results are displayed) Labs Reviewed - No data to display  EKG   Radiology DG Cervical Spine Complete  Result Date: 05/10/2020 CLINICAL DATA:  Severe neck pain. EXAM: CERVICAL SPINE - COMPLETE 4+ VIEW COMPARISON:  No prior. FINDINGS: C5-C6 prominent disc degeneration and endplate osteophyte formation. No acute soft tissue bony abnormality. No evidence of fracture dislocation. No acute abnormality identified. IMPRESSION: C5-C6 prominent disc degeneration and endplate osteophyte formation. No acute abnormality. Electronically Signed   By: Marcello Moores  Register   On: 05/10/2020 11:09    Procedures Procedures (including critical care time)  Medications Ordered in UC Medications  ketorolac (TORADOL) injection 60 mg (60 mg Intramuscular Given 05/10/20 1115)    Initial Impression / Assessment and Plan / UC Course  I have reviewed the triage vital signs and the nursing notes.  Pertinent labs & imaging results that were available during my care of the patient were reviewed by me and considered in my medical decision making (see chart for details).     Neck pain X-ray revealed C5/C6 prominent disc degeneration and endplate osteophyte formation. Most likely the cause of her pain along with nerve inflammation We will prescribe prednisone taper over the next 6 days and Flexeril as  needed. Neck brace given here for  comfort. Toradol given here for pain  Recommended rest, alternate heat and ice and gentle stretching Follow up as needed for continued or worsening symptoms  Final Clinical Impressions(s) / UC Diagnoses   Final diagnoses:  Neck pain     Discharge Instructions     You do have some cervical spine degeneration or wear and tear on the spine. I believe you have some muscle spasms and nerve inflammation. Nothing concerning We gave you Toradol here for pain.  We will give you a neck brace to wear for comfort.  I am prescribing prednisone to take for 6 days with food and muscle relaxant as needed.  Be aware the muscle relaxant may female you drowsy.  Rest, alternate heat and ice and stretch the neck.  Follow up as needed for continued or worsening symptoms      ED Prescriptions    Medication Sig Dispense Auth. Provider   predniSONE (STERAPRED UNI-PAK 21 TAB) 10 MG (21) TBPK tablet 6 tabs for 1 day, then 5 tabs for 1 das, then 4 tabs for 1 day, then 3 tabs for 1 day, 2 tabs for 1 day, then 1 tab for 1 day 21 tablet Verma Grothaus A, NP   cyclobenzaprine (FLEXERIL) 5 MG tablet Take 1 tablet (5 mg total) by mouth 3 (three) times daily as needed for muscle spasms. 30 tablet Loura Halt A, NP     PDMP not reviewed this encounter.   Loura Halt A, NP 05/11/20 0830

## 2020-06-12 DIAGNOSIS — F17200 Nicotine dependence, unspecified, uncomplicated: Secondary | ICD-10-CM | POA: Diagnosis not present

## 2020-06-13 DIAGNOSIS — F17200 Nicotine dependence, unspecified, uncomplicated: Secondary | ICD-10-CM | POA: Diagnosis not present

## 2020-06-14 DIAGNOSIS — F17200 Nicotine dependence, unspecified, uncomplicated: Secondary | ICD-10-CM | POA: Diagnosis not present

## 2020-06-15 DIAGNOSIS — F17200 Nicotine dependence, unspecified, uncomplicated: Secondary | ICD-10-CM | POA: Diagnosis not present

## 2020-06-16 DIAGNOSIS — R252 Cramp and spasm: Secondary | ICD-10-CM | POA: Diagnosis not present

## 2020-06-16 DIAGNOSIS — F17200 Nicotine dependence, unspecified, uncomplicated: Secondary | ICD-10-CM | POA: Diagnosis not present

## 2020-06-16 DIAGNOSIS — R000 Tachycardia, unspecified: Secondary | ICD-10-CM | POA: Diagnosis not present

## 2020-06-22 DIAGNOSIS — F17200 Nicotine dependence, unspecified, uncomplicated: Secondary | ICD-10-CM | POA: Diagnosis not present

## 2020-06-24 DIAGNOSIS — F17200 Nicotine dependence, unspecified, uncomplicated: Secondary | ICD-10-CM | POA: Diagnosis not present

## 2020-06-26 DIAGNOSIS — F17200 Nicotine dependence, unspecified, uncomplicated: Secondary | ICD-10-CM | POA: Diagnosis not present

## 2020-06-29 DIAGNOSIS — F17200 Nicotine dependence, unspecified, uncomplicated: Secondary | ICD-10-CM | POA: Diagnosis not present

## 2020-07-01 DIAGNOSIS — F17200 Nicotine dependence, unspecified, uncomplicated: Secondary | ICD-10-CM | POA: Diagnosis not present

## 2020-07-03 DIAGNOSIS — F17200 Nicotine dependence, unspecified, uncomplicated: Secondary | ICD-10-CM | POA: Diagnosis not present

## 2020-07-06 DIAGNOSIS — F17200 Nicotine dependence, unspecified, uncomplicated: Secondary | ICD-10-CM | POA: Diagnosis not present

## 2020-07-08 DIAGNOSIS — F17200 Nicotine dependence, unspecified, uncomplicated: Secondary | ICD-10-CM | POA: Diagnosis not present

## 2020-07-10 DIAGNOSIS — F17200 Nicotine dependence, unspecified, uncomplicated: Secondary | ICD-10-CM | POA: Diagnosis not present

## 2020-07-13 DIAGNOSIS — F17200 Nicotine dependence, unspecified, uncomplicated: Secondary | ICD-10-CM | POA: Diagnosis not present

## 2020-07-15 DIAGNOSIS — F17200 Nicotine dependence, unspecified, uncomplicated: Secondary | ICD-10-CM | POA: Diagnosis not present

## 2020-07-17 DIAGNOSIS — F17200 Nicotine dependence, unspecified, uncomplicated: Secondary | ICD-10-CM | POA: Diagnosis not present

## 2020-07-20 DIAGNOSIS — F17200 Nicotine dependence, unspecified, uncomplicated: Secondary | ICD-10-CM | POA: Diagnosis not present

## 2020-07-22 DIAGNOSIS — F17200 Nicotine dependence, unspecified, uncomplicated: Secondary | ICD-10-CM | POA: Diagnosis not present

## 2020-07-24 DIAGNOSIS — F17200 Nicotine dependence, unspecified, uncomplicated: Secondary | ICD-10-CM | POA: Diagnosis not present

## 2020-07-27 DIAGNOSIS — F17200 Nicotine dependence, unspecified, uncomplicated: Secondary | ICD-10-CM | POA: Diagnosis not present

## 2020-07-29 DIAGNOSIS — F17200 Nicotine dependence, unspecified, uncomplicated: Secondary | ICD-10-CM | POA: Diagnosis not present

## 2020-07-31 DIAGNOSIS — F17200 Nicotine dependence, unspecified, uncomplicated: Secondary | ICD-10-CM | POA: Diagnosis not present

## 2020-08-03 DIAGNOSIS — F17200 Nicotine dependence, unspecified, uncomplicated: Secondary | ICD-10-CM | POA: Diagnosis not present

## 2020-08-05 DIAGNOSIS — F17200 Nicotine dependence, unspecified, uncomplicated: Secondary | ICD-10-CM | POA: Diagnosis not present

## 2020-08-07 DIAGNOSIS — F17200 Nicotine dependence, unspecified, uncomplicated: Secondary | ICD-10-CM | POA: Diagnosis not present

## 2020-08-10 DIAGNOSIS — F17200 Nicotine dependence, unspecified, uncomplicated: Secondary | ICD-10-CM | POA: Diagnosis not present

## 2020-08-12 DIAGNOSIS — F17200 Nicotine dependence, unspecified, uncomplicated: Secondary | ICD-10-CM | POA: Diagnosis not present

## 2020-08-14 DIAGNOSIS — F17200 Nicotine dependence, unspecified, uncomplicated: Secondary | ICD-10-CM | POA: Diagnosis not present

## 2020-08-17 DIAGNOSIS — F17200 Nicotine dependence, unspecified, uncomplicated: Secondary | ICD-10-CM | POA: Diagnosis not present

## 2020-10-14 ENCOUNTER — Encounter: Payer: Medicaid Other | Admitting: Internal Medicine

## 2020-12-01 ENCOUNTER — Encounter: Payer: Medicaid Other | Admitting: Internal Medicine

## 2021-03-07 ENCOUNTER — Telehealth: Payer: Self-pay | Admitting: Nurse Practitioner

## 2021-03-07 ENCOUNTER — Other Ambulatory Visit: Payer: Self-pay

## 2021-03-07 ENCOUNTER — Ambulatory Visit: Payer: Self-pay | Admitting: *Deleted

## 2021-03-07 ENCOUNTER — Ambulatory Visit: Payer: Medicaid Other | Attending: Nurse Practitioner | Admitting: Nurse Practitioner

## 2021-03-07 NOTE — Telephone Encounter (Signed)
Summary: Clinical Advice   Patient has updated her contact # please call patient at [phone]   ----- Message from Oneta Rack sent at 03/07/2021 11:03 AM EDT -----  Patient has been bleeding for a little over 1 week since PCP has d/c her birth control. Patient has a future appointment scheduled with PCP for 04/25/2021. Patient states she would prefer to see PCP only. Patient seeking clinical advice regarding the ongoing heavy bleeding.      Reason for Disposition  MODERATE vaginal bleeding (e.g., soaking 1 pad or tampon per hour and present > 6 hours; 1 menstrual cup every 6 hours)  Answer Assessment - Initial Assessment Questions 1. AMOUNT: "Describe the bleeding that you are having."    - SPOTTING: spotting, or pinkish / brownish mucous discharge; does not fill panty liner or pad    - MILD:  less than 1 pad / hour; less than patient's usual menstrual bleeding   - MODERATE: 1-2 pads / hour; 1 menstrual cup every 6 hours; small-medium blood clots (e.g., pea, grape, small coin)   - SEVERE: soaking 2 or more pads/hour for 2 or more hours; 1 menstrual cup every 2 hours; bleeding not contained by pads or continuous red blood from vagina; large blood clots (e.g., golf ball, large coin)      Moderate but severe- since yesterday 2. ONSET: "When did the bleeding begin?" "Is it continuing now?"     9/18- yes 3. MENSTRUAL PERIOD: "When was the last normal menstrual period?" "How is this different than your period?"     9/3-7, prolonged bleeding 4. REGULARITY: "How regular are your periods?"     Usually regular- last 4 months have been regular 5. ABDOMINAL PAIN: "Do you have any pain?" "How bad is the pain?"  (e.g., Scale 1-10; mild, moderate, or severe)   - MILD (1-3): doesn't interfere with normal activities, abdomen soft and not tender to touch    - MODERATE (4-7): interferes with normal activities or awakens from sleep, abdomen tender to touch    - SEVERE (8-10): excruciating pain, doubled  over, unable to do any normal activities      no 6. PREGNANCY: "Could you be pregnant?" "Are you sexually active?" "Did you recently give birth?"     No- yes- condoms every time 7. BREASTFEEDING: "Are you breastfeeding?"     no 8. HORMONES: "Are you taking any hormone medications, prescription or OTC?" (e.g., birth control pills, estrogen)     no 9. BLOOD THINNERS: "Do you take any blood thinners?" (e.g., Coumadin/warfarin, Pradaxa/dabigatran, aspirin)     no 10. CAUSE: "What do you think is causing the bleeding?" (e.g., recent gyn surgery, recent gyn procedure; known bleeding disorder, cervical cancer, polycystic ovarian disease, fibroids)         Irregular cycle 11. HEMODYNAMIC STATUS: "Are you weak or feeling lightheaded?" If Yes, ask: "Can you stand and walk normally?"        no 12. OTHER SYMPTOMS: "What other symptoms are you having with the bleeding?" (e.g., passed tissue, vaginal discharge, fever, menstrual-type cramps)       Sometimes clots  Protocols used: Vaginal Bleeding - Abnormal-A-AH

## 2021-03-07 NOTE — Telephone Encounter (Signed)
No answer. LVM to return call to office

## 2021-03-07 NOTE — Telephone Encounter (Signed)
Patient is calling to report she is having heavy irregular bleeding. LMP- 9/3-9/7, stopped 4 days and restarted 5 days- now continues to bleed since 9/18- patient states he flow has increased since yesterday. Patient states she is using condoms with intercourse, no change in partner, no symptoms of prolonged bleeding at this point. Appointment for evaluation of bleeding

## 2021-03-08 NOTE — Telephone Encounter (Signed)
She missed her appointment with me. Please call patient regarding her current reason for requesting a call back 1 day later after her televisit was a no show.

## 2021-03-08 NOTE — Telephone Encounter (Signed)
Pt called office, is requesting call back. [phone]

## 2021-03-09 NOTE — Telephone Encounter (Signed)
Returned pt call and lvm

## 2021-04-25 ENCOUNTER — Ambulatory Visit: Payer: Medicaid Other | Admitting: Internal Medicine

## 2021-06-05 ENCOUNTER — Encounter (HOSPITAL_COMMUNITY): Payer: Self-pay

## 2021-06-05 ENCOUNTER — Ambulatory Visit (HOSPITAL_COMMUNITY)
Admission: EM | Admit: 2021-06-05 | Discharge: 2021-06-05 | Disposition: A | Discharge status: Home / Self Care | Payer: Medicaid Other | Attending: Physician Assistant | Admitting: Physician Assistant

## 2021-06-05 ENCOUNTER — Other Ambulatory Visit: Payer: Self-pay

## 2021-06-05 DIAGNOSIS — J039 Acute tonsillitis, unspecified: Secondary | ICD-10-CM | POA: Diagnosis not present

## 2021-06-05 DIAGNOSIS — J029 Acute pharyngitis, unspecified: Secondary | ICD-10-CM | POA: Insufficient documentation

## 2021-06-05 LAB — POCT RAPID STREP A, ED / UC: Streptococcus, Group A Screen (Direct): NEGATIVE

## 2021-06-05 LAB — POCT INFECTIOUS MONO SCREEN, ED / UC: Mono Screen: NEGATIVE

## 2021-06-05 MED ORDER — LIDOCAINE VISCOUS HCL 2 % MT SOLN: 10.0000 mL | Freq: Three times a day (TID) | OROMUCOSAL | 0 refills | Status: DC | PRN

## 2021-06-05 MED ORDER — IBUPROFEN 100 MG/5ML PO SUSP
ORAL | Status: AC
  Filled 2021-06-05: qty 20

## 2021-06-05 MED ORDER — AMOXICILLIN-POT CLAVULANATE 400-57 MG/5ML PO SUSR: 875.0000 mg | Freq: Two times a day (BID) | ORAL | 0 refills | Status: AC

## 2021-06-05 MED ORDER — IBUPROFEN 100 MG/5ML PO SUSP
400.0000 mg | Freq: Once | ORAL | Status: AC
  Administered 2021-06-05: 15:00:00 400 mg via ORAL

## 2021-06-05 NOTE — Discharge Instructions (Signed)
Your strep and mono testing were negative.  We will contact you if we need to change antibiotics based on your throat culture obtained.  If you develop any rash or additional lesions please stop the Augmentin to be seen immediately.  Take Augmentin twice daily for 10 days.  This can upset your stomach so take it with food.  Use lidocaine every 8 hours for pain.  Do not eat or drink immediately after use of this medication as it increases the risk of choking.  You can use Tylenol and ibuprofen for additional symptom relief.  If you have any worsening symptoms including difficulty breathing, swelling of your throat/tongue, shortness of breath, voice change you need to go to the emergency room.  If symptoms or not worsening but also not improving within a week please return or see your PCP.

## 2021-06-05 NOTE — ED Provider Notes (Signed)
[facility]    CSN: [id number] Arrival date & time: 06/05/21  1143      History   Chief Complaint Chief Complaint  Patient presents with   Sore Throat    HPI Ashley Spencer is a 35 y.o. female.   Patient presents today with a 2-day history of sore throat.  She reports mild nasal congestion but her primary concern today is a sore throat.  Pain is rated 10 on a 0-10 pain scale, localized to posterior oropharynx, described as sharp, worse with swallowing, no alleviating factors notified.  She denies any known sick contacts but does work in a [employer] and says exposed to many people.  Denies any recent antibiotic use.  She does not use any inhaled corticosteroids.  She has been using cough drops but denies additional over-the-counter medication for symptom management.  She is eating and drinking despite symptoms that is challenging due to pain.  She denies history of tonsillectomy or seeing ENT in the past.  Denies any muffled voice, shortness of breath, feeling like her throat is swelling.   Past Medical History:  Diagnosis Date   Anemia    Arthritis    Headache    History of pyelonephritis 2018   History of sepsis    04-04-2019  urosepsis due to obstructive right ureteral stone   History of trichomonal vaginitis    Nocturia    Right ureteral stone     Patient Active Problem List   Diagnosis Date Noted   Ureteral stone 04/04/2019    Past Surgical History:  Procedure Laterality Date   CYSTOSCOPY W/ URETERAL STENT PLACEMENT Right 04/04/2019   Procedure: CYSTOSCOPY WITH RETROGRADE PYELOGRAM/URETERAL STENT PLACEMENT;  Surgeon: Alexis Frock, MD;  Location: [facility];  Service: Urology;  Laterality: Right;   CYSTOSCOPY WITH RETROGRADE PYELOGRAM, URETEROSCOPY AND STENT PLACEMENT Right 04/22/2019   Procedure: CYSTOSCOPY WITH RETROGRADE PYELOGRAM, DIAGNOSTIC URETEROSCOPY AND STENT EXCHANGE;  Surgeon: Alexis Frock, MD;  Location: [facility];  Service: Urology;  Laterality: Right;  6 MINS    OB History     Gravida  5   Para  3   Term  3   Preterm      AB  2   Living  3      SAB      IAB  2   Ectopic      Multiple      Live Births               Home Medications    Prior to Admission medications   Medication Sig Start Date End Date Taking? Authorizing Provider  amoxicillin-clavulanate (AUGMENTIN) 400-57 MG/5ML suspension Take 10.9 mLs (875 mg total) by mouth 2 (two) times daily for 10 days. 06/05/21 06/15/21 Yes Natisha Trzcinski K, PA-C  lidocaine (XYLOCAINE) 2 % solution Use as directed 10 mLs in the mouth or throat every 8 (eight) hours as needed for mouth pain. 06/05/21  Yes Jalyn Dutta, Derry Skill, PA-C    Family History Family History  Problem Relation Age of Onset   Migraines Father 58   Hypertension Brother    Hypertension Maternal Aunt    Hypertension Maternal Uncle    Hypertension Maternal Grandmother     Social History Social History   Tobacco Use   Smoking status: Former    Packs/day: 1.00    Years: 3.00    Pack years: 3.00    Types: Cigarettes    Quit date: 04/03/2019  Years since quitting: 2.1   Smokeless tobacco: Never  Vaping Use   Vaping Use: Never used  Substance Use Topics   Alcohol use: No   Drug use: No     Allergies   Patient has no known allergies.   Review of Systems Review of Systems  Constitutional:  Positive for activity change and fatigue. Negative for appetite change and fever.  HENT:  Positive for sore throat and trouble swallowing. Negative for congestion, sinus pressure, sneezing and voice change.   Respiratory:  Positive for cough. Negative for shortness of breath.   Cardiovascular:  Negative for chest pain.  Gastrointestinal:  Negative for abdominal pain, diarrhea, nausea and vomiting.  Musculoskeletal:  Negative for arthralgias and myalgias.  Neurological:  Negative for dizziness, light-headedness and headaches.    Physical Exam Triage  Vital Signs ED Triage Vitals  Enc Vitals Group     BP 06/05/21 1422 (!) 159/112     Pulse Rate 06/05/21 1422 75     Resp 06/05/21 1422 18     Temp 06/05/21 1422 99.1 F (37.3 C)     Temp Source 06/05/21 1422 Oral     SpO2 06/05/21 1422 98 %     Weight --      Height --      Head Circumference --      Peak Flow --      Pain Score 06/05/21 1424 0     Pain Loc --      Pain Edu? --      Excl. in [place]? --    No data found.  Updated Vital Signs BP (!) 159/112 (BP Location: Right Arm)    Pulse 75    Temp 99.1 F (37.3 C) (Oral)    Resp 18    LMP 06/02/2021    SpO2 98%   Visual Acuity Right Eye Distance:   Left Eye Distance:   Bilateral Distance:    Right Eye Near:   Left Eye Near:    Bilateral Near:     Physical Exam Vitals reviewed.  Constitutional:      General: She is awake. She is not in acute distress.    Appearance: Normal appearance. She is well-developed. She is not ill-appearing.     Comments: Very pleasant female appears stated age in no acute distress sitting comfortably in exam room  HENT:     Head: Normocephalic and atraumatic.     Right Ear: Tympanic membrane, ear canal and external ear normal. Tympanic membrane is not erythematous or bulging.     Left Ear: Tympanic membrane, ear canal and external ear normal. Tympanic membrane is not erythematous or bulging.     Nose:     Right Sinus: No maxillary sinus tenderness or frontal sinus tenderness.     Left Sinus: No maxillary sinus tenderness or frontal sinus tenderness.     Mouth/Throat:     Pharynx: Uvula midline. Posterior oropharyngeal erythema present. No oropharyngeal exudate.     Tonsils: Tonsillar exudate present. No tonsillar abscesses. 1+ on the right. 1+ on the left.  Cardiovascular:     Rate and Rhythm: Normal rate and regular rhythm.     Heart sounds: Normal heart sounds, S1 normal and S2 normal. No murmur heard. Pulmonary:     Effort: Pulmonary effort is normal.     Breath sounds: Normal breath  sounds. No wheezing, rhonchi or rales.     Comments: Clear to auscultation bilaterally Psychiatric:  Behavior: Behavior is cooperative.     UC Treatments / Results  Labs (all labs ordered are listed, but only abnormal results are displayed) Labs Reviewed  CULTURE, GROUP A STREP [facility])  POCT RAPID STREP A, ED / UC  POCT INFECTIOUS MONO SCREEN, ED / UC    EKG   Radiology No results found.  Procedures Procedures (including critical care time)  Medications Ordered in UC Medications  ibuprofen (ADVIL) 100 MG/5ML suspension 400 mg (400 mg Oral Given 06/05/21 1520)    Initial Impression / Assessment and Plan / UC Course  I have reviewed the triage vital signs and the nursing notes.  Pertinent labs & imaging results that were available during my care of the patient were reviewed by me and considered in my medical decision making (see chart for details).     Strep and mono testing were negative in clinic today.  Throat culture obtained-results pending.  We will start Augmentin given clinical presentation.  Discussed that if her symptoms are caused by mono and this was a false negative in clinic she may develop a rash with Augmentin use and should stop the medication and be seen immediately with any side effects or concerns.  She was given lidocaine to help manage sore throat symptoms with instruction not to eat or drink immediately after use of medication as it increases risk of choking/aspiration.  She can alternate Tylenol and ibuprofen for pain.  Recommended gargling with warm salt water.  Discussed alarm symptoms that warrant emergent evaluation including difficulty speaking, difficulty swallowing, shortness of breath, swelling of her throat.  She was provided work excuse note.  Discussed that if symptoms or not improving but also not worsening after 1 week she should return or see PCP for reevaluation.  Strict return precautions given to which she expressed  understanding.  Final Clinical Impressions(s) / UC Diagnoses   Final diagnoses:  Exudative tonsillitis  Sore throat     Discharge Instructions      Your strep and mono testing were negative.  We will contact you if we need to change antibiotics based on your throat culture obtained.  If you develop any rash or additional lesions please stop the Augmentin to be seen immediately.  Take Augmentin twice daily for 10 days.  This can upset your stomach so take it with food.  Use lidocaine every 8 hours for pain.  Do not eat or drink immediately after use of this medication as it increases the risk of choking.  You can use Tylenol and ibuprofen for additional symptom relief.  If you have any worsening symptoms including difficulty breathing, swelling of your throat/tongue, shortness of breath, voice change you need to go to the emergency room.  If symptoms or not worsening but also not improving within a week please return or see your PCP.     ED Prescriptions     Medication Sig Dispense Auth. Provider   amoxicillin-clavulanate (AUGMENTIN) 400-57 MG/5ML suspension Take 10.9 mLs (875 mg total) by mouth 2 (two) times daily for 10 days. 220 mL Aleighna Wojtas K, PA-C   lidocaine (XYLOCAINE) 2 % solution Use as directed 10 mLs in the mouth or throat every 8 (eight) hours as needed for mouth pain. 100 mL Amoreena Neubert K, PA-C      PDMP not reviewed this encounter.   Terrilee Croak, PA-C 06/05/21 1529

## 2021-06-05 NOTE — ED Triage Notes (Signed)
Sore throat x 2 days, also mild cough and having to blow nose.  No body aches, HA, fever.  Has been using cough drops.

## 2021-06-07 LAB — CULTURE, GROUP A STREP (THRC)

## 2021-06-27 ENCOUNTER — Other Ambulatory Visit: Payer: Self-pay

## 2021-06-27 ENCOUNTER — Encounter: Payer: Self-pay | Admitting: Internal Medicine

## 2021-06-27 ENCOUNTER — Ambulatory Visit: Payer: Medicaid Other | Attending: Internal Medicine | Admitting: Internal Medicine

## 2021-06-27 VITALS — BP 129/82 | HR 65 | Resp 16 | Wt 110.8 lb

## 2021-06-27 DIAGNOSIS — R030 Elevated blood-pressure reading, without diagnosis of hypertension: Secondary | ICD-10-CM | POA: Diagnosis not present

## 2021-06-27 DIAGNOSIS — B353 Tinea pedis: Secondary | ICD-10-CM | POA: Diagnosis not present

## 2021-06-27 DIAGNOSIS — S91209A Unspecified open wound of unspecified toe(s) with damage to nail, initial encounter: Secondary | ICD-10-CM | POA: Diagnosis not present

## 2021-06-27 MED ORDER — CLOTRIMAZOLE-BETAMETHASONE 1-0.05 % EX CREA: 1.0000 "application " | TOPICAL_CREAM | Freq: Two times a day (BID) | CUTANEOUS | 0 refills | Status: DC

## 2021-06-27 NOTE — Patient Instructions (Signed)

## 2021-06-27 NOTE — Progress Notes (Signed)
Patient ID: Ashley Spencer, female    DOB: [birth year]  MRN: [id number]  CC: Hospitalization Follow-up (Urgent care) and Referral (Podiatrist )   Subjective: Ashley Spencer is a 36 y.o. female who presents for UC f/u Her concerns today include:  Tobacco dependence, ureteral stone  Patient was seen at urgent care the end of last month with sore throat.  Found to have strep throat and was treated with Augmentin.  Symptoms have resolved.  Today she complains of itchy rash between the fourth and fifth toes on both feet which has been present for about 6 months.  She also noted that the toenail on the right second toe is significantly discolored and partially raised off the nailbed.  It has been like this for a while.  I note that her blood pressure is elevated today and was also elevated when she was seen at urgent care.  No past history of hypertension.  She endorses eating a lot of salty foods. Patient Active Problem List   Diagnosis Date Noted   Ureteral stone 04/04/2019     Current Outpatient Medications on File Prior to Visit  Medication Sig Dispense Refill   lidocaine (XYLOCAINE) 2 % solution Use as directed 10 mLs in the mouth or throat every 8 (eight) hours as needed for mouth pain. (Patient not taking: Reported on 06/27/2021) 100 mL 0   No current facility-administered medications on file prior to visit.    No Known Allergies  Social History   Socioeconomic History   Marital status: Single    Spouse name: Not on file   Number of children: 3   Years of education: 11th   Highest education level: Not on file  Occupational History    Employer: OTHER    Comment: n/a  Tobacco Use   Smoking status: Former    Packs/day: 1.00    Years: 3.00    Pack years: 3.00    Types: Cigarettes    Quit date: 04/03/2019    Years since quitting: 2.2   Smokeless tobacco: Never  Vaping Use   Vaping Use: Never used  Substance and Sexual Activity   Alcohol use: No   Drug use: No    Sexual activity: Yes    Birth control/protection: None  Other Topics Concern   Not on file  Social History Narrative   Patient lives at home with her grandmother.   Caffeine Use: 1 cup every other morning   Social Determinants of Health   Financial Resource Strain: Not on file  Food Insecurity: Not on file  Transportation Needs: Not on file  Physical Activity: Not on file  Stress: Not on file  Social Connections: Not on file  Intimate Partner Violence: Not on file    Family History  Problem Relation Age of Onset   Migraines Father 75   Hypertension Brother    Hypertension Maternal Aunt    Hypertension Maternal Uncle    Hypertension Maternal Grandmother     Past Surgical History:  Procedure Laterality Date   CYSTOSCOPY W/ URETERAL STENT PLACEMENT Right 04/04/2019   Procedure: CYSTOSCOPY WITH RETROGRADE PYELOGRAM/URETERAL STENT PLACEMENT;  Surgeon: Alexis Frock, MD;  Location: [facility];  Service: Urology;  Laterality: Right;   CYSTOSCOPY WITH RETROGRADE PYELOGRAM, URETEROSCOPY AND STENT PLACEMENT Right 04/22/2019   Procedure: CYSTOSCOPY WITH RETROGRADE PYELOGRAM, DIAGNOSTIC URETEROSCOPY AND STENT EXCHANGE;  Surgeon: Alexis Frock, MD;  Location: [facility];  Service: Urology;  Laterality: Right;  60 MINS    ROS:  Review of Systems Negative except as stated above  PHYSICAL EXAM: BP 129/82    Pulse 65    Resp 16    Wt 110 lb 12.8 oz (50.3 kg)    LMP 06/02/2021    SpO2 100%    BMI 18.44 kg/m   Physical Exam  General appearance - alert, well appearing, and in no distress Mental status - normal mood, behavior, speech, dress, motor activity, and thought processes Skin -skin between the fourth and fifth toe appears slightly hyperpigmented and cracked.  Toenail on the right second toe is hyperpigmented and thickened.  Slightly raised off the nailbed.   CMP Latest Ref Rng & Units 04/06/2019 04/05/2019 04/04/2019  Glucose 70 - 99 mg/dL 101(H) 140(H) 131(H)   BUN 6 - 20 mg/dL 24(H) 25(H) 25(H)  Creatinine 0.44 - 1.00 mg/dL 1.13(H) 1.38(H) 1.97(H)  Sodium 135 - 145 mmol/L 136 134(L) 129(L)  Potassium 3.5 - 5.1 mmol/L 3.7 4.0 3.0(L)  Chloride 98 - 111 mmol/L 114(H) 112(H) 103  CO2 22 - 32 mmol/L 15(L) 15(L) 13(L)  Calcium 8.9 - 10.3 mg/dL 8.0(L) 8.2(L) 6.8(L)  Total Protein 6.5 - 8.1 g/dL - - -  Total Bilirubin 0.3 - 1.2 mg/dL - - -  Alkaline Phos 38 - 126 U/L - - -  AST 15 - 41 U/L - - -  ALT 0 - 44 U/L - - -   Lipid Panel  No results found for: CHOL, TRIG, HDL, CHOLHDL, VLDL, LDLCALC, LDLDIRECT  CBC    Component Value Date/Time   WBC 7.0 12/03/2019 1650   WBC 17.7 (H) 04/07/2019 0133   RBC 4.31 12/03/2019 1650   RBC 3.80 (L) 04/07/2019 0133   HGB 11.9 12/03/2019 1650   HCT 36.3 12/03/2019 1650   PLT 345 12/03/2019 1650   MCV 84 12/03/2019 1650   MCH 27.6 12/03/2019 1650   MCH 27.9 04/07/2019 0133   MCHC 32.8 12/03/2019 1650   MCHC 33.4 04/07/2019 0133   RDW 13.1 12/03/2019 1650   LYMPHSABS 1.1 04/05/2019 0604   MONOABS 1.5 (H) 04/05/2019 0604   EOSABS 0.0 04/05/2019 0604   BASOSABS 0.1 04/05/2019 0604    ASSESSMENT AND PLAN: 1. Tinea pedis of both feet Recommend keeping the skin clean and dry. - clotrimazole-betamethasone (LOTRISONE) cream; Apply 1 application topically 2 (two) times daily.  Dispense: 30 g; Refill: 0 - Ambulatory referral to Podiatry  2. Avulsion of toenail, initial encounter Looks like she has onychomycosis of this nail that is too far gone.  The fact that it is partially avulsed off the nailbed, she probably would need to see a podiatrist to have it removed completely.  3. Elevated blood-pressure reading without diagnosis of hypertension Repeat blood pressure today is better but still not in normal range.  DASH diet discussed and encouraged.  Printed information given.  Follow-up with clinical pharmacist in 1 month for repeat blood pressure check.   Patient was given the opportunity to ask questions.   Patient verbalized understanding of the plan and was able to repeat key elements of the plan.   Orders Placed This Encounter  Procedures   Ambulatory referral to Podiatry     Requested Prescriptions   Signed Prescriptions Disp Refills   clotrimazole-betamethasone (LOTRISONE) cream 30 g 0    Sig: Apply 1 application topically 2 (two) times daily.    Return for Appt with Lurena Joiner in 4 wks for BP check.  Ashley Plumber, MD, FACP

## 2021-07-04 ENCOUNTER — Ambulatory Visit: Payer: Self-pay | Admitting: Podiatry

## 2021-07-24 ENCOUNTER — Ambulatory Visit: Payer: Self-pay | Admitting: Podiatry

## 2021-07-25 ENCOUNTER — Ambulatory Visit: Payer: Medicaid Other | Admitting: Pharmacist

## 2021-08-02 ENCOUNTER — Ambulatory Visit: Payer: Medicaid Other | Admitting: Pharmacist

## 2021-12-20 ENCOUNTER — Ambulatory Visit: Payer: Self-pay

## 2021-12-20 NOTE — Telephone Encounter (Signed)
Reached out to [place], RN to see if pt could be worked in for appt. She gave options of pt could go to mobile unit or schedule appt and add to wait list. Called pt and notified her of this and pt refused to got mobile unit and scheduled appt for Oct 10th at 1610. Pt was added to wait list.

## 2021-12-20 NOTE — Telephone Encounter (Signed)
  Chief Complaint: menstrual bleeding abnormal Symptoms: severe heavy bleeding passing large clots, cramps Frequency: ongoing for months  Pertinent Negatives: Patient denies dizziness or weakness Disposition: '[]'$ ED /'[]'$ Urgent Care (no appt availability in office) / '[]'$ Appointment(In office/virtual)/ '[]'$  [facility]/ '[]'$ Home Care/ '[]'$ Refused Recommended Disposition /'[]'$ [facility]/ '[x]'$  Follow-up with PCP Additional Notes: pt prefers to see Dr. Wynetta Emery vs seeing another provider or going to ED. She has had heavy bleeding for months but said she started June 1st and stopped June 27th and then started back July 3rd and still bleeding. Pt said she has pictures of the clots to prove how big they are and has to take Midol and use heating pad to manage cramps. Advised pt of appt available with Dr. Joya Gaskins on 01/02/22 but pt refused. Explained that I would send message to Dr. Wynetta Emery and her nurse can fu if they are able to work pt in. Pt was currently working.   Reason for Disposition  SEVERE vaginal bleeding (e.g., soaking 2 pads or tampons per hour and present 2 or more hours; 1 menstrual cup every 2 hours)  Answer Assessment - Initial Assessment Questions 1. AMOUNT: "Describe the bleeding that you are having."    - SPOTTING: spotting, or pinkish / brownish mucous discharge; does not fill panty liner or pad    - MILD:  less than 1 pad / hour; less than patient's usual menstrual bleeding   - MODERATE: 1-2 pads / hour; 1 menstrual cup every 6 hours; small-medium blood clots (e.g., pea, grape, small coin)   - SEVERE: soaking 2 or more pads/hour for 2 or more hours; 1 menstrual cup every 2 hours; bleeding not contained by pads or continuous red blood from vagina; large blood clots (e.g., golf ball, large coin)      Severe  2. ONSET: "When did the bleeding begin?" "Is it continuing now?"     June 1-27, stopped and then started back July 3rd  5. ABDOMINAL PAIN: "Do you have any pain?" "How bad  is the pain?"  (e.g., Scale 1-10; mild, moderate, or severe)   - MILD (1-3): doesn't interfere with normal activities, abdomen soft and not tender to touch    - MODERATE (4-7): interferes with normal activities or awakens from sleep, abdomen tender to touch    - SEVERE (8-10): excruciating pain, doubled over, unable to do any normal activities      0 now, this morning took 2 Midol and heating pad was at 11  0 8. HORMONES: "Are you taking any hormone medications, prescription or OTC?" (e.g., birth control pills, estrogen)     no 9. BLOOD THINNERS: "Do you take any blood thinners?" (e.g., Coumadin/warfarin, Pradaxa/dabigatran, aspirin)     no  11. HEMODYNAMIC STATUS: "Are you weak or feeling lightheaded?" If Yes, ask: "Can you stand and walk normally?"        no 12. OTHER SYMPTOMS: "What other symptoms are you having with the bleeding?" (e.g., passed tissue, vaginal discharge, fever, menstrual-type cramps)       Cramps and passing large clots  Protocols used: Vaginal Bleeding - Abnormal-A-AH

## 2021-12-20 NOTE — Telephone Encounter (Signed)
Noted  

## 2022-01-15 ENCOUNTER — Emergency Department (HOSPITAL_COMMUNITY)
Admission: EM | Admit: 2022-01-15 | Discharge: 2022-01-15 | Disposition: A | Discharge status: Home / Self Care | Payer: Medicaid Other | Attending: Emergency Medicine | Admitting: Emergency Medicine

## 2022-01-15 ENCOUNTER — Encounter (HOSPITAL_COMMUNITY): Payer: Self-pay | Admitting: Emergency Medicine

## 2022-01-15 ENCOUNTER — Emergency Department (HOSPITAL_COMMUNITY): Payer: Medicaid Other

## 2022-01-15 ENCOUNTER — Ambulatory Visit: Payer: Self-pay

## 2022-01-15 DIAGNOSIS — N939 Abnormal uterine and vaginal bleeding, unspecified: Secondary | ICD-10-CM | POA: Diagnosis not present

## 2022-01-15 DIAGNOSIS — D269 Other benign neoplasm of uterus, unspecified: Secondary | ICD-10-CM | POA: Insufficient documentation

## 2022-01-15 DIAGNOSIS — N858 Other specified noninflammatory disorders of uterus: Secondary | ICD-10-CM

## 2022-01-15 LAB — BASIC METABOLIC PANEL
Anion gap: 6 (ref 5–15)
BUN: 13 mg/dL (ref 6–20)
CO2: 24 mmol/L (ref 22–32)
Calcium: 9.2 mg/dL (ref 8.9–10.3)
Chloride: 108 mmol/L (ref 98–111)
Creatinine, Ser: 0.99 mg/dL (ref 0.44–1.00)
GFR, Estimated: >60 mL/min (ref >60)
Glucose, Bld: 95 mg/dL (ref 70–99)
Potassium: 3.7 mmol/L (ref 3.5–5.1)
Sodium: 138 mmol/L (ref 135–145)

## 2022-01-15 LAB — CBC WITH DIFFERENTIAL/PLATELET
Abs Immature Granulocytes: 0.01 10*3/uL (ref 0.00–0.07)
Basophils Absolute: 0 10*3/uL (ref 0.0–0.1)
Basophils Relative: 1 %
Eosinophils Absolute: 0.2 10*3/uL (ref 0.0–0.5)
Eosinophils Relative: 3 %
HCT: 33.8 % — ABNORMAL LOW (ref 36.0–46.0)
Hemoglobin: 10.8 g/dL — ABNORMAL LOW (ref 12.0–15.0)
Immature Granulocytes: 0 %
Lymphocytes Relative: 37 %
Lymphs Abs: 1.9 10*3/uL (ref 0.7–4.0)
MCH: 26.3 pg (ref 26.0–34.0)
MCHC: 32 g/dL (ref 30.0–36.0)
MCV: 82.2 fL (ref 80.0–100.0)
Monocytes Absolute: 0.5 10*3/uL (ref 0.1–1.0)
Monocytes Relative: 10 %
Neutro Abs: 2.5 10*3/uL (ref 1.7–7.7)
Neutrophils Relative %: 49 %
Platelets: 395 10*3/uL (ref 150–400)
RBC: 4.11 MIL/uL (ref 3.87–5.11)
RDW: 16.2 % — ABNORMAL HIGH (ref 11.5–15.5)
WBC: 5.1 10*3/uL (ref 4.0–10.5)
nRBC: 0 % (ref 0.0–0.2)

## 2022-01-15 LAB — TYPE AND SCREEN
ABO/RH(D): O POS
Antibody Screen: NEGATIVE

## 2022-01-15 LAB — I-STAT BETA HCG BLOOD, ED (MC, WL, AP ONLY): I-stat hCG, quantitative: <5 m[IU]/mL (ref <5)

## 2022-01-15 LAB — PROTIME-INR
INR: 1.1 (ref 0.8–1.2)
Prothrombin Time: 13.7 seconds (ref 11.4–15.2)

## 2022-01-15 LAB — APTT: aPTT: 29 seconds (ref 24–36)

## 2022-01-15 MED ORDER — NORETHINDRONE ACETATE 5 MG PO TABS: 5.0000 mg | ORAL_TABLET | Freq: Every day | ORAL | 0 refills | Status: DC

## 2022-01-15 MED ORDER — OXYCODONE-ACETAMINOPHEN 5-325 MG PO TABS
1.0000 | ORAL_TABLET | Freq: Once | ORAL | Status: AC
  Administered 2022-01-15: 1 via ORAL
  Filled 2022-01-15: qty 1

## 2022-01-15 MED ORDER — ACETAMINOPHEN 325 MG PO TABS
650.0000 mg | ORAL_TABLET | ORAL | Status: AC
  Administered 2022-01-15: 650 mg via ORAL
  Filled 2022-01-15: qty 2

## 2022-01-15 MED ORDER — KETOROLAC TROMETHAMINE 15 MG/ML IJ SOLN
10.0000 mg | Freq: Once | INTRAMUSCULAR | Status: AC
  Administered 2022-01-15: 10 mg via INTRAVENOUS
  Filled 2022-01-15: qty 1

## 2022-01-15 NOTE — Telephone Encounter (Signed)
  Chief Complaint: Very heavy period with blood clots Symptoms: Cramps of 8/10 Frequency: every month - this period since Thursday Pertinent Negatives: Patient denies Fever Disposition: '[x]'$ ED /'[]'$ Urgent Care (no appt availability in office) / '[]'$ Appointment(In office/virtual)/ '[]'$  [facility]/ '[]'$ Home Care/ '[]'$ Refused Recommended Disposition /'[]'$ [facility]/ '[]'$  Follow-up with PCP Additional Notes: Pt has very heavy periods every month. Pt states that her period started thursday and is filling 2 pads per hour. Pt is starting to feel tired from blood loss.  Reason for Disposition  SEVERE vaginal bleeding (e.g., soaking 2 pads or tampons per hour and present 2 or more hours; 1 menstrual cup every 2 hours)  Answer Assessment - Initial Assessment Questions 1. AMOUNT: "Describe the bleeding that you are having."    - SPOTTING: spotting, or pinkish / brownish mucous discharge; does not fill panty liner or pad    - MILD:  less than 1 pad / hour; less than patient's usual menstrual bleeding   - MODERATE: 1-2 pads / hour; 1 menstrual cup every 6 hours; small-medium blood clots (e.g., pea, grape, small coin)   - SEVERE: soaking 2 or more pads/hour for 2 or more hours; 1 menstrual cup every 2 hours; bleeding not contained by pads or continuous red blood from vagina; large blood clots (e.g., golf ball, large coin)      2 pads per hour 2. ONSET: "When did the bleeding begin?" "Is it continuing now?"     Started Thursday 3. MENSTRUAL PERIOD: "When was the last normal menstrual period?" "How is this different than your period?"     No.  Every month 4. REGULARITY: "How regular are your periods?"     Every month 5. ABDOMEN PAIN: "Do you have any pain?" "How bad is the pain?"  (e.g., Scale 1-10; mild, moderate, or severe)   - MILD (1-3): doesn't interfere with normal activities, abdomen soft and not tender to touch    - MODERATE (4-7): interferes with normal activities or awakens from sleep,  abdomen tender to touch    - SEVERE (8-10): excruciating pain, doubled over, unable to do any normal activities      7/10 6. PREGNANCY: "Is there any chance you are pregnant?" "When was your last menstrual period?"     no 7. BREASTFEEDING: "Are you breastfeeding?"     no 8. HORMONE MEDICINES: "Are you taking any hormone medicines, prescription or over-the-counter?" (e.g., birth control pills, estrogen)     no 9. BLOOD THINNER MEDICINES: "Do you take any blood thinners?" (e.g., Coumadin / warfarin, Pradaxa / dabigatran, aspirin)     no 10. CAUSE: "What do you think is causing the bleeding?" (e.g., recent gyn surgery, recent gyn procedure; known bleeding disorder, cervical cancer, polycystic ovarian disease, fibroids)         Past 5 months 11. HEMODYNAMIC STATUS: "Are you weak or feeling lightheaded?" If Yes, ask: "Can you stand and walk normally?"        lifeless 12. OTHER SYMPTOMS: "What other symptoms are you having with the bleeding?" (e.g., passed tissue, vaginal discharge, fever, menstrual-type cramps)       Clots, blood  Protocols used: Vaginal Bleeding - Abnormal-A-AH

## 2022-01-15 NOTE — ED Provider Notes (Signed)
[facility] DEPT Provider Note   CSN: [id number] Arrival date & time: 01/15/22  1629     History {Add pertinent medical, surgical, social history, OB history to HPI:1} Chief Complaint  Patient presents with   Vaginal Bleeding    Ashley Spencer is a 36 y.o. female.  36 yo F G5P3 with hx of heavy menses who presents with abdominal pain and heavy vaginal bleeding. Says has been like this since 2020 after a procedure for a kidney stone. Has monthly periods that last 10-12 days at a time. Has been having bleeding since she started her cycle on Thursday with passage of large clots. Has been going through 2 pads per hour since then (approx 1.5 boxes) also using tampons. Says she has been taking midol at home but is having very severe menstrual cramps and it has not been working. No hx of fibroids, bleeding do, AC use, and is not currently on birth control. Does not currently have an OBGYN.    Currently sexually active and uses protection every time. No vaginal dc.    Vaginal Bleeding      Home Medications Prior to Admission medications   Medication Sig Start Date End Date Taking? Authorizing Provider  clotrimazole-betamethasone (LOTRISONE) cream Apply 1 application topically 2 (two) times daily. 06/27/21   Ladell Pier, MD  lidocaine (XYLOCAINE) 2 % solution Use as directed 10 mLs in the mouth or throat every 8 (eight) hours as needed for mouth pain. Patient not taking: Reported on 06/27/2021 06/05/21   Raspet, Derry Skill, PA-C      Allergies    Patient has no known allergies.    Review of Systems   Review of Systems  Genitourinary:  Positive for vaginal bleeding.    Physical Exam Updated Vital Signs BP 110/82   Pulse 70   Temp 98.3 F (36.8 C) (Oral)   Resp 17   Ht '5\' 5"'$  (1.651 m)   Wt 50 kg   SpO2 100%   BMI 18.34 kg/m  Physical Exam  ED Results / Procedures / Treatments   Labs (all labs ordered are listed, but only abnormal  results are displayed) Labs Reviewed  CBC WITH DIFFERENTIAL/PLATELET - Abnormal; Notable for the following components:      Result Value   Hemoglobin 10.8 (*)    HCT 33.8 (*)    RDW 16.2 (*)    All other components within normal limits  BASIC METABOLIC PANEL  APTT  PROTIME-INR  I-STAT BETA HCG BLOOD, ED ([facility], [facility], [facility] ONLY)  TYPE AND SCREEN    EKG None  Radiology No results found.  Procedures Procedures  {Document cardiac monitor, telemetry assessment procedure when appropriate:1}  Medications Ordered in ED Medications  ketorolac (TORADOL) 15 MG/ML injection 10 mg (has no administration in time range)  oxyCODONE-acetaminophen (PERCOCET/ROXICET) 5-325 MG per tablet 1 tablet (1 tablet Oral Given 01/15/22 1702)    ED Course/ Medical Decision Making/ A&P Clinical Course as of 01/15/22 2219  Mon Jan 15, 2022  1941 Hemoglobin(!): 10.8 Baseline 10.6. [RP]  2151 US PELVIC COMPLETE WITH TRANSVAGINAL IMPRESSION: 1. Small intramural fundal fibroid. 2. A 3.9 x 1.9 x 3 cm rounded heterogeneous lesion may represent an endometrial mass or a submucosal fibroid abutting the endometrium. Recommend gyn consultation. Consider further evaluation with sonohysterogram or pelvic MRI. Endometrial sampling should also be considered if patient is at high risk for endometrial carcinoma. [RP]    Clinical Course User Index [RP] Fransico Meadow, MD  Medical Decision Making Does smoke > nonestrogen. Only had 1 pad in the ED.  Amount and/or Complexity of Data Reviewed Labs: ordered. Decision-making details documented in ED Course. Radiology: ordered. Decision-making details documented in ED Course.  Risk OTC drugs. Prescription drug management.   ***  {Document critical care time when appropriate:1} {Document review of labs and clinical decision tools ie heart score, Chads2Vasc2 etc:1}  {Document your independent review of radiology images, and any outside  records:1} {Document your discussion with family members, caretakers, and with consultants:1} {Document social determinants of health affecting pt's care:1} {Document your decision making why or why not admission, treatments were needed:1} Final Clinical Impression(s) / ED Diagnoses Final diagnoses:  None    Rx / DC Orders ED Discharge Orders     None

## 2022-01-15 NOTE — ED Provider Triage Note (Signed)
Emergency Medicine Provider Triage Evaluation Note  Ashley Spencer , a 36 y.o. female  was evaluated in triage.  Pt complains of vaginal bleeding and pelvic cramping.  Patient reports that bleeding started on Thursday and has been continuous since then.  Patient states that today she is having to change her pad or tampon 3-4 times in an hour.  Patient does endorse generalized fatigue.  Patient states that her last menstrual period occurred at the beginning of last month.  Patient is on any forms of birth control.  Patient is sexually active in a mutually monogamous relationship with a female partner, does use condoms for penetrative vaginal intercourse.  Review of Systems  Positive: Fatigue, vaginal bleeding, pelvic cramping Negative: Lightheadedness, syncope, shortness of breath  Physical Exam  BP (!) 113/93 (BP Location: Right Arm)   Pulse 70   Temp 98.3 F (36.8 C) (Oral)   Resp 18   Ht '5\' 5"'$  (1.651 m)   Wt 50 kg   SpO2 98%   BMI 18.34 kg/m  Gen:   Awake, no distress   Resp:  Normal effort  MSK:   Moves extremities without difficulty  Other:  Abdomen soft, nondistended, nontender with no guarding or rebound tenderness.  Medical Decision Making  Medically screening exam initiated at 4:57 PM.  Appropriate orders placed.  Murlean Hark was informed that the remainder of the evaluation will be completed by another provider, this initial triage assessment does not replace that evaluation, and the importance of remaining in the ED until their evaluation is complete.     Loni Beckwith, [place] 01/15/22 1657

## 2022-01-15 NOTE — ED Triage Notes (Signed)
Pt having vaginal bleeding and abd cramps since Thursday. Pt has not seen OBGYN yet.

## 2022-01-15 NOTE — Discharge Instructions (Addendum)
Today you were seen in the emergency department for your vaginal bleeding.    In the emergency department you had lab work which was reassuring and an ultrasound which showed a mass in your uterus. It is likely a fibroid but you will need to follow-up with OBGYN as soon as possible to discuss this and make sure it is not cancerous.    At home, please take ibuprofen and tylenol for your cramping.  Start taking the contraceptive we have prescribed you to reduce the bleeding.   Follow-up with OBGYN in the next week regarding your visit.   Return immediately to the emergency department if you experience any of the following: worsening bleeding, shortness of breath, light headedness, fainting, or any other concerning symptoms.    Thank you for visiting our Emergency Department. It was a pleasure taking care of you today.

## 2022-01-17 ENCOUNTER — Ambulatory Visit: Payer: Self-pay | Admitting: *Deleted

## 2022-01-17 NOTE — Telephone Encounter (Signed)
Chief Complaint: anxious, crying, feeling overwhelmed Symptoms: patient seen in ED and told she has uterine mass. Continues with vaginal bleeding and very upset she has been prescribed medication norethindrone(Aygestin) 5 mg daily and was told by pharmacy medicaid will not cover and she can not afford $72 out of pocket. Reports bleeding continues and cramping. Told today [facility] Will not take medicaid and now she is forced to find another Gynecology to see her and will have to wait even longer to be seen. Fearful of uterine cancer. Overwhelmed  she now needs note for work and can not move heavy boxes for work. Midol ibuprofen not helping with abdominal pain and cramping.  Frequency: na Pertinent Negatives: Patient denies dizziness, weakness. Disposition: '[]'$ ED /'[]'$ Urgent Care (no appt availability in office) / '[x]'$ Appointment(In office/virtual)/ '[]'$  [facility]/ '[]'$ Home Care/ '[]'$ Refused Recommended Disposition /'[]'$ [facility]/ '[x]'$  Follow-up with PCP Additional Notes:   Patient has appt scheduled for 02/07/22 but would like PCP to write for a alternative medication she can afford to help with her bleeding and pain. Please advise. Requesting a call from PCP to help her process what she needs to do and how she can get help.    Reason for Disposition  Patient sounds very upset or troubled to the triager  Answer Assessment - Initial Assessment Questions 1. CONCERN: "Did anything happen that prompted you to call today?"      Learned of uterine mass and crying and anxious  2. ANXIETY SYMPTOMS: "Can you describe how you (your loved one; patient) have been feeling?" (e.g., tense, restless, panicky, anxious, keyed up, overwhelmed, sense of impending doom).      Overwhelmed anxious  3. ONSET: "How long have you been feeling this way?" (e.g., hours, days, weeks)     Today  4. SEVERITY: "How would you rate the level of anxiety?" (e.g., 0 - 10; or mild, moderate, severe).     na 5.  FUNCTIONAL IMPAIRMENT: "How have these feelings affected your ability to do daily activities?" "Have you had more difficulty than usual doing your normal daily activities?" (e.g., getting better, same, worse; self-care, school, work, interactions)     na 6. HISTORY: "Have you felt this way before?" "Have you ever been diagnosed with an anxiety problem in the past?" (e.g., generalized anxiety disorder, panic attacks, PTSD). If Yes, ask: "How was this problem treated?" (e.g., medicines, counseling, etc.)     na 7. RISK OF HARM - SUICIDAL IDEATION: "Do you ever have thoughts of hurting or killing yourself?" If Yes, ask:  "Do you have these feelings now?" "Do you have a plan on how you would do this?"     na 8. TREATMENT:  "What has been done so far to treat this anxiety?" (e.g., medicines, relaxation strategies). "What has helped?"     na 9. TREATMENT - THERAPIST: "Do you have a counselor or therapist? Name?"     na 10. POTENTIAL TRIGGERS: "Do you drink caffeinated beverages (e.g., coffee, colas, teas), and how much daily?" "Do you drink alcohol or use any drugs?" "Have you started any new medicines recently?"       na 11. PATIENT SUPPORT: "Who is with you now?" "Who do you live with?" "Do you have family or friends who you can talk to?"        na 12. OTHER SYMPTOMS: "Do you have any other symptoms?" (e.g., feeling depressed, trouble concentrating, trouble sleeping, trouble breathing, palpitations or fast heartbeat, chest pain, sweating, nausea, or diarrhea)  na 13. PREGNANCY: "Is there any chance you are pregnant?" "When was your last menstrual period?"       na  Protocols used: Anxiety and Panic Attack-A-AH

## 2022-01-18 NOTE — Telephone Encounter (Signed)
Patient will f/u with [facility] operations.

## 2022-02-07 ENCOUNTER — Ambulatory Visit: Payer: Medicaid Other | Admitting: Physician Assistant

## 2022-02-07 NOTE — Progress Notes (Deleted)
Patient ID: KELCEY KORUS, female   DOB: 1985/06/13, 36 y.o.   MRN: [id number]   After ED visit 01/15/2022  From ED note: 36 yo F G5P3 with hx of heavy menses who presents with abdominal pain and heavy vaginal bleeding. Says has been like this since 2020 after a procedure for a kidney stone. Has monthly periods that last 10-12 days at a time. Has been having bleeding since she started her cycle on Thursday with passage of large clots. Has been going through 2 pads per hour since then (approx 1.5 boxes) also using tampons. Says she has been taking midol at home but is having very severe menstrual cramps and it has not been working. No hx of fibroids, bleeding do, AC use, and is not currently on birth control. Does not currently have an OBGYN.   US PELVIC COMPLETE WITH TRANSVAGINAL IMPRESSION: 1. Small intramural fundal fibroid. 2. A 3.9 x 1.9 x 3 cm rounded heterogeneous lesion may represent an endometrial mass or a submucosal fibroid abutting the endometrium. Recommend gyn consultation. Consider further evaluation with sonohysterogram or pelvic MRI. Endometrial sampling should also be considered if patient is at high risk for endometrial carcinoma.  Medical Decision Making 36 year old female who presents to the emergency department with heavy vaginal bleeding.  On exam the patient is well-appearing.  Does not have any symptoms of anemia at this time.  Vital signs are stable and her hemoglobin is within normal limits.  Despite complaining of soaking 2 pads an hour at home was monitored for several hours in the emergency department and only used 1 pad.  She had transvaginal ultrasound that was obtained which did show a 4 x 2 x 3 cm mass that is likely a fibroid.  Patient was notified and informed of the importance of following up with her OB/GYN about this to ensure that it is not cancer.  Patient was started on progestin containing OCP since she smokes cigarettes and estrogen containing OCPs are  contraindicated.  She was instructed to follow-up with her OB/GYN in 2 to 3 days.  Return precautions discussed prior to discharge.

## 2022-03-20 ENCOUNTER — Encounter: Payer: Self-pay | Admitting: Internal Medicine

## 2022-03-20 ENCOUNTER — Other Ambulatory Visit: Payer: Self-pay | Admitting: Internal Medicine

## 2022-03-20 ENCOUNTER — Ambulatory Visit: Payer: Medicaid Other | Attending: Internal Medicine | Admitting: Internal Medicine

## 2022-03-20 VITALS — BP 115/79 | HR 101 | Ht 60.0 in | Wt 114.0 lb

## 2022-03-20 DIAGNOSIS — N946 Dysmenorrhea, unspecified: Secondary | ICD-10-CM | POA: Diagnosis not present

## 2022-03-20 DIAGNOSIS — D500 Iron deficiency anemia secondary to blood loss (chronic): Secondary | ICD-10-CM | POA: Diagnosis not present

## 2022-03-20 DIAGNOSIS — B353 Tinea pedis: Secondary | ICD-10-CM

## 2022-03-20 DIAGNOSIS — N858 Other specified noninflammatory disorders of uterus: Secondary | ICD-10-CM | POA: Diagnosis not present

## 2022-03-20 DIAGNOSIS — N920 Excessive and frequent menstruation with regular cycle: Secondary | ICD-10-CM | POA: Diagnosis not present

## 2022-03-20 DIAGNOSIS — D259 Leiomyoma of uterus, unspecified: Secondary | ICD-10-CM

## 2022-03-20 MED ORDER — IBUPROFEN 800 MG PO TABS: 800.0000 mg | ORAL_TABLET | Freq: Three times a day (TID) | ORAL | 0 refills | Status: DC | PRN

## 2022-03-20 MED ORDER — TRAMADOL HCL 50 MG PO TABS: 50.0000 mg | ORAL_TABLET | Freq: Three times a day (TID) | ORAL | 0 refills | Status: DC | PRN

## 2022-03-20 NOTE — Progress Notes (Signed)
Pt requesting Rx to decrease heaving bleeding.

## 2022-03-20 NOTE — Progress Notes (Signed)
Patient ID: Ashley Spencer, female    DOB: Aug 25, 1985  MRN: [id number]  CC: Abnormal period   Subjective: Ashley Spencer is a 36 y.o. female who presents for UC Her concerns today include:  Tobacco dependence, ureteral stone  Patient presents today complaining of heavy, prolonged and painful menstrual cycles since 2021 after having a procedure for ureteral stone.  Menses have been lasting 2 weeks with lots of clots.  She is currently on her menstrual cycle that started 03/12/2022.  She has to change pads about every 2 hours.  Received a Depo shot once in the past about 3 years ago and it caused excessive prolonged bleeding.  She is currently not on any method of birth control. She has been using Midol and other over-the-counter pain medications without relief for her cramps.  She has a picture on her phone that shows a large clot that she passed several days ago. Seen in the ER 01/15/2022 for heavy painful vaginal bleeding.  Pelvic ultrasound revealed the following: IMPRESSION: 1. Small intramural fundal fibroid. 2. A 3.9 x 1.9 x 3 cm rounded heterogeneous lesion may represent an endometrial mass or a submucosal fibroid abutting the endometrium. Recommend gyn consultation. Consider further evaluation with sonohysterogram or pelvic MRI. Endometrial sampling should also be considered if patient is at high risk for endometrial carcinoma. (Ref: Radiological Reasoning: Algorithmic Workup of Abnormal Vaginal Bleeding with Endovaginal Sonography and Sonohysterography. AJR 2008; 416:S06-30) 3. Normal sonographic appearance of the ovaries.  CBC revealed mild anemia with H/H of 10.8/33.8.  MCV normal. Referred to GYN but practice she was referred to was not excepting Medicaid.  She still smokes but considers self a light smoker.  1 pack of cigarettes lasts 1 month.    Patient Active Problem List   Diagnosis Date Noted   Ureteral stone 04/04/2019     Current Outpatient Medications on File  Prior to Visit  Medication Sig Dispense Refill   clotrimazole-betamethasone (LOTRISONE) cream Apply 1 application topically 2 (two) times daily. 30 g 0   lidocaine (XYLOCAINE) 2 % solution Use as directed 10 mLs in the mouth or throat every 8 (eight) hours as needed for mouth pain. (Patient not taking: Reported on 03/20/2022) 100 mL 0   norethindrone (AYGESTIN) 5 MG tablet Take 1 tablet (5 mg total) by mouth daily. 30 tablet 0   No current facility-administered medications on file prior to visit.    No Known Allergies  Social History   Socioeconomic History   Marital status: Single    Spouse name: Not on file   Number of children: 3   Years of education: 11th   Highest education level: Not on file  Occupational History    Employer: OTHER    Comment: n/a  Tobacco Use   Smoking status: Former    Packs/day: 1.00    Years: 3.00    Total pack years: 3.00    Types: Cigarettes    Quit date: 04/03/2019    Years since quitting: 2.9   Smokeless tobacco: Never  Vaping Use   Vaping Use: Never used  Substance and Sexual Activity   Alcohol use: No   Drug use: No   Sexual activity: Yes    Birth control/protection: None  Other Topics Concern   Not on file  Social History Narrative   Patient lives at home with her grandmother.   Caffeine Use: 1 cup every other morning   Social Determinants of Health   Financial Resource Strain: Not on  file  Food Insecurity: Not on file  Transportation Needs: Not on file  Physical Activity: Not on file  Stress: Not on file  Social Connections: Not on file  Intimate Partner Violence: Not on file    Family History  Problem Relation Age of Onset   Migraines Father 26   Hypertension Brother    Hypertension Maternal Aunt    Hypertension Maternal Uncle    Hypertension Maternal Grandmother     Past Surgical History:  Procedure Laterality Date   CYSTOSCOPY W/ URETERAL STENT PLACEMENT Right 04/04/2019   Procedure: CYSTOSCOPY WITH RETROGRADE  PYELOGRAM/URETERAL STENT PLACEMENT;  Surgeon: Alexis Frock, MD;  Location: [facility];  Service: Urology;  Laterality: Right;   CYSTOSCOPY WITH RETROGRADE PYELOGRAM, URETEROSCOPY AND STENT PLACEMENT Right 04/22/2019   Procedure: CYSTOSCOPY WITH RETROGRADE PYELOGRAM, DIAGNOSTIC URETEROSCOPY AND STENT EXCHANGE;  Surgeon: Alexis Frock, MD;  Location: [facility];  Service: Urology;  Laterality: Right;  60 MINS    ROS: Review of Systems Negative except as stated above  PHYSICAL EXAM: BP 115/79   Pulse (!) 101   Ht 5' (1.524 m)   Wt 114 lb (51.7 kg)   SpO2 98%   BMI 22.26 kg/m   Physical Exam  General appearance - alert, well appearing, and in no distress Mental status - normal mood, behavior, speech, dress, motor activity, and thought processes Eyes -pale conjunctiva Abdomen - soft, nontender, nondistended, no masses or organomegaly      Latest Ref Rng & Units 01/15/2022    4:54 PM 04/06/2019    2:19 AM 04/05/2019    6:04 AM  CMP  Glucose 70 - 99 mg/dL 95  101  140   BUN 6 - 20 mg/dL '13  24  25   '$ Creatinine 0.44 - 1.00 mg/dL 0.99  1.13  1.38   Sodium 135 - 145 mmol/L 138  136  134   Potassium 3.5 - 5.1 mmol/L 3.7  3.7  4.0   Chloride 98 - 111 mmol/L 108  114  112   CO2 22 - 32 mmol/L '24  15  15   '$ Calcium 8.9 - 10.3 mg/dL 9.2  8.0  8.2    Lipid Panel  No results found for: "CHOL", "TRIG", "HDL", "CHOLHDL", "VLDL", "LDLCALC", "LDLDIRECT"  CBC    Component Value Date/Time   WBC 5.1 01/15/2022 1654   RBC 4.11 01/15/2022 1654   HGB 10.8 (L) 01/15/2022 1654   HGB 11.9 12/03/2019 1650   HCT 33.8 (L) 01/15/2022 1654   HCT 36.3 12/03/2019 1650   PLT 395 01/15/2022 1654   PLT 345 12/03/2019 1650   MCV 82.2 01/15/2022 1654   MCV 84 12/03/2019 1650   MCH 26.3 01/15/2022 1654   MCHC 32.0 01/15/2022 1654   RDW 16.2 (H) 01/15/2022 1654   RDW 13.1 12/03/2019 1650   LYMPHSABS 1.9 01/15/2022 1654   MONOABS 0.5 01/15/2022 1654   EOSABS 0.2 01/15/2022 1654    BASOSABS 0.0 01/15/2022 1654    ASSESSMENT AND PLAN: 1. Menorrhagia with regular cycle 2. Dysmenorrhea -We will try to get her in with gynecology as soon as possible.  Menorrhagia and dysmenorrhea most likely due to uterine fibroid.  Is also a questionable mass in the uterus that may or may not be a fibroid.  Because of this, I will hold off on putting her on birth control pills.  I have given some ibuprofen and tramadol to use as needed for the painful cramps. [facility] reviewed - CBC - Iron,  TIBC and Ferritin Panel - Ambulatory referral to Gynecology - ibuprofen (ADVIL) 800 MG tablet; Take 1 tablet (800 mg total) by mouth every 8 (eight) hours as needed for cramping or moderate pain.  Dispense: 60 tablet; Refill: 0 - traMADol (ULTRAM) 50 MG tablet; Take 1 tablet (50 mg total) by mouth every 8 (eight) hours as needed for up to 5 days.  Dispense: 15 tablet; Refill: 0 - Ambulatory referral to Gynecology  3. Uterine leiomyoma, unspecified location 4.  Uterine mass - Ambulatory referral to Gynecology  4. Anemia due to chronic blood loss - Ambulatory referral to Gynecology    Patient was given the opportunity to ask questions.  Patient verbalized understanding of the plan and was able to repeat key elements of the plan.   This documentation was completed using Radio producer.  Any transcriptional errors are unintentional.  No orders of the defined types were placed in this encounter.    Requested Prescriptions    No prescriptions requested or ordered in this encounter    No follow-ups on file.  Karle Plumber, MD, FACP

## 2022-03-21 ENCOUNTER — Telehealth: Payer: Self-pay | Admitting: Internal Medicine

## 2022-03-21 MED ORDER — FERROUS SULFATE 325 (65 FE) MG PO TABS: 325.0000 mg | ORAL_TABLET | Freq: Every day | ORAL | 0 refills | Status: AC

## 2022-03-21 NOTE — Telephone Encounter (Signed)
Call placed to patient and pt states that she is currently at work, pt medication has been sent to pharmacy.

## 2022-03-21 NOTE — Telephone Encounter (Signed)
Pt had a visit w/ Dr Wynetta Emery yesterday and she states she was supposed to be prescribed iron pills. But the pharmacy did not get the prescription. Please send to  [id number] [place]

## 2022-03-21 NOTE — Telephone Encounter (Signed)
Pt requesting iron pills.

## 2022-03-22 ENCOUNTER — Other Ambulatory Visit: Payer: Medicaid Other

## 2022-03-24 ENCOUNTER — Telehealth: Payer: Self-pay | Admitting: Internal Medicine

## 2022-03-24 NOTE — Telephone Encounter (Signed)
-----   Message from Ena Dawley sent at 03/22/2022 12:47 PM EDT ----- Regarding: RE: GYN appt -ASAP please. 04/19/2022 @ 1:55pm   ----- Message ----- From: Ladell Pier, MD Sent: 03/20/2022   9:56 PM EDT To: Ena Dawley Subject: GYN appt -ASAP please.                         Please try to get her in with GYN ASAP.

## 2022-04-04 ENCOUNTER — Ambulatory Visit: Payer: Medicaid Other | Admitting: Podiatry

## 2022-04-10 ENCOUNTER — Other Ambulatory Visit: Payer: Self-pay | Admitting: Internal Medicine

## 2022-04-10 DIAGNOSIS — N946 Dysmenorrhea, unspecified: Secondary | ICD-10-CM

## 2022-04-13 NOTE — Telephone Encounter (Signed)
Pt called to see if there is an approval for the tramadol.  She is having cramps and needs something for the weekend

## 2022-04-18 ENCOUNTER — Ambulatory Visit: Payer: Medicaid Other | Admitting: Podiatry

## 2022-04-19 ENCOUNTER — Encounter: Payer: Medicaid Other | Admitting: Obstetrics and Gynecology

## 2022-04-19 NOTE — Progress Notes (Deleted)
GYNECOLOGY ANNUAL PHYSICAL EXAM PROGRESS NOTE  Subjective:    Ashley Spencer is a 36 y.o. [phone] female who presents as a new gyn patient. . The patient {is/is not/has never been:13135} sexually active. The patient participates in regular exercise: {yes/no/not asked:9010}. Has the patient ever been transfused or tattooed?: {yes/no/not asked:9010}. The patient reports that there {is/is not:9024} domestic violence in her life.    The patient has the following complaints today:  Menstrual History: Menarche age: *** No LMP recorded. (Menstrual status: Irregular Periods).     Gynecologic History:  Contraception: {method:5051} History of STI's:  Last Pap: ***. Results were: {norm/abn:16337}.  ***Denies/Notes h/o abnormal pap smears. Last mammogram: ***. Results were: {norm/abn:16337}       OB History  Gravida Para Term Preterm AB Living  '5 3 3 '$ 0 2 3  SAB IAB Ectopic Multiple Live Births  0 2 0 0 0    # Outcome Date GA Lbr Len/2nd Weight Sex Delivery Anes PTL Lv  5 Term           4 Term           3 Term           2 IAB           1 IAB             Past Medical History:  Diagnosis Date   Anemia    Arthritis    Headache    History of pyelonephritis 2018   History of sepsis    04-04-2019  urosepsis due to obstructive right ureteral stone   History of trichomonal vaginitis    Nocturia    Right ureteral stone     Past Surgical History:  Procedure Laterality Date   CYSTOSCOPY W/ URETERAL STENT PLACEMENT Right 04/04/2019   Procedure: CYSTOSCOPY WITH RETROGRADE PYELOGRAM/URETERAL STENT PLACEMENT;  Surgeon: Alexis Frock, MD;  Location: [facility];  Service: Urology;  Laterality: Right;   CYSTOSCOPY WITH RETROGRADE PYELOGRAM, URETEROSCOPY AND STENT PLACEMENT Right 04/22/2019   Procedure: CYSTOSCOPY WITH RETROGRADE PYELOGRAM, DIAGNOSTIC URETEROSCOPY AND STENT EXCHANGE;  Surgeon: Alexis Frock, MD;  Location: [facility];  Service: Urology;  Laterality:  Right;  41 MINS    Family History  Problem Relation Age of Onset   Migraines Father 74   Hypertension Brother    Hypertension Maternal Aunt    Hypertension Maternal Uncle    Hypertension Maternal Grandmother     Social History   Socioeconomic History   Marital status: Single    Spouse name: Not on file   Number of children: 3   Years of education: 11th   Highest education level: Not on file  Occupational History    Employer: OTHER    Comment: n/a  Tobacco Use   Smoking status: Former    Packs/day: 1.00    Years: 3.00    Total pack years: 3.00    Types: Cigarettes    Quit date: 04/03/2019    Years since quitting: 3.0   Smokeless tobacco: Never  Vaping Use   Vaping Use: Never used  Substance and Sexual Activity   Alcohol use: No   Drug use: No   Sexual activity: Yes    Birth control/protection: None  Other Topics Concern   Not on file  Social History Narrative   Patient lives at home with her grandmother.   Caffeine Use: 1 cup every other morning   Social Determinants of Health   Financial Resource Strain:  Not on file  Food Insecurity: Not on file  Transportation Needs: Not on file  Physical Activity: Not on file  Stress: Not on file  Social Connections: Not on file  Intimate Partner Violence: Not on file    Current Outpatient Medications on File Prior to Visit  Medication Sig Dispense Refill   clotrimazole-betamethasone (LOTRISONE) cream APPLY TO AFFECTED AREA TWICE A DAY 30 g 0   ferrous sulfate 325 (65 FE) MG tablet Take 1 tablet (325 mg total) by mouth daily with breakfast. 60 tablet 0   ibuprofen (ADVIL) 800 MG tablet TAKE 1 TABLET BY MOUTH EVERY 8 HOURS AS NEEDED FOR CRAMPING OR MODERATE PAIN 60 tablet 0   norethindrone (AYGESTIN) 5 MG tablet Take 1 tablet (5 mg total) by mouth daily. 30 tablet 0   traMADol (ULTRAM) 50 MG tablet TAKE 1 TABLET BY MOUTH EVERY 8 HOURS AS NEEDED FOR UP TO 5 DAYS 12 tablet 0   No current facility-administered medications  on file prior to visit.    No Known Allergies   Review of Systems Constitutional: negative for chills, fatigue, fevers and sweats Eyes: negative for irritation, redness and visual disturbance Ears, nose, mouth, throat, and face: negative for hearing loss, nasal congestion, snoring and tinnitus Respiratory: negative for asthma, cough, sputum Cardiovascular: negative for chest pain, dyspnea, exertional chest pressure/discomfort, irregular heart beat, palpitations and syncope Gastrointestinal: negative for abdominal pain, change in bowel habits, nausea and vomiting Genitourinary: negative for abnormal menstrual periods, genital lesions, sexual problems and vaginal discharge, dysuria and urinary incontinence Integument/breast: negative for breast lump, breast tenderness and nipple discharge Hematologic/lymphatic: negative for bleeding and easy bruising Musculoskeletal:negative for back pain and muscle weakness Neurological: negative for dizziness, headaches, vertigo and weakness Endocrine: negative for diabetic symptoms including polydipsia, polyuria and skin dryness Allergic/Immunologic: negative for hay fever and urticaria      Objective:  There were no vitals taken for this visit. There is no height or weight on file to calculate BMI.    General Appearance:    Alert, cooperative, no distress, appears stated age  Head:    Normocephalic, without obvious abnormality, atraumatic  Eyes:    PERRL, conjunctiva/corneas clear, EOM's intact, both eyes  Ears:    Normal external ear canals, both ears  Nose:   Nares normal, septum midline, mucosa normal, no drainage or sinus tenderness  Throat:   Lips, mucosa, and tongue normal; teeth and gums normal  Neck:   Supple, symmetrical, trachea midline, no adenopathy; thyroid: no enlargement/tenderness/nodules; no carotid bruit or JVD  Back:     Symmetric, no curvature, ROM normal, no CVA tenderness  Lungs:     Clear to auscultation bilaterally,  respirations unlabored  Chest Wall:    No tenderness or deformity   Heart:    Regular rate and rhythm, S1 and S2 normal, no murmur, rub or gallop  Breast Exam:    No tenderness, masses, or nipple abnormality  Abdomen:     Soft, non-tender, bowel sounds active all four quadrants, no masses, no organomegaly.    Genitalia:    Pelvic:external genitalia normal, vagina without lesions, discharge, or tenderness, rectovaginal septum  normal. Cervix normal in appearance, no cervical motion tenderness, no adnexal masses or tenderness.  Uterus normal size, shape, mobile, regular contours, nontender.  Rectal:    Normal external sphincter.  No hemorrhoids appreciated. Internal exam not done.   Extremities:   Extremities normal, atraumatic, no cyanosis or edema  Pulses:   2+ and symmetric all  extremities  Skin:   Skin color, texture, turgor normal, no rashes or lesions  Lymph nodes:   Cervical, supraclavicular, and axillary nodes normal  Neurologic:   CNII-XII intact, normal strength, sensation and reflexes throughout   .  Labs:  Lab Results  Component Value Date   WBC 5.1 01/15/2022   HGB 10.8 (L) 01/15/2022   HCT 33.8 (L) 01/15/2022   MCV 82.2 01/15/2022   PLT 395 01/15/2022    Lab Results  Component Value Date   CREATININE 0.99 01/15/2022   BUN 13 01/15/2022   NA 138 01/15/2022   K 3.7 01/15/2022   CL 108 01/15/2022   CO2 24 01/15/2022    Lab Results  Component Value Date   ALT 17 04/04/2019   AST 22 04/04/2019   ALKPHOS 208 (H) 04/04/2019   BILITOT 1.0 04/04/2019    No results found for: "TSH"   Assessment:   No diagnosis found.   Plan:  Blood tests: {blood tests:13147}. Breast self exam technique reviewed and patient encouraged to perform self-exam monthly. Contraception: {contraceptive methods:5051}. Discussed healthy lifestyle modifications. Mammogram {discussed/ordered:14545} Pap smear {discussed/ordered:14545}. COVID vaccination status: Follow up in 1 year for  annual exam   Chilton Greathouse, [facility]

## 2022-06-23 ENCOUNTER — Encounter (HOSPITAL_COMMUNITY): Payer: Self-pay

## 2022-06-23 ENCOUNTER — Ambulatory Visit (HOSPITAL_COMMUNITY)
Admission: EM | Admit: 2022-06-23 | Discharge: 2022-06-23 | Disposition: A | Discharge status: Home / Self Care | Payer: Medicaid Other | Attending: Emergency Medicine | Admitting: Emergency Medicine

## 2022-06-23 DIAGNOSIS — S86812A Strain of other muscle(s) and tendon(s) at lower leg level, left leg, initial encounter: Secondary | ICD-10-CM

## 2022-06-23 MED ORDER — KETOROLAC TROMETHAMINE 30 MG/ML IJ SOLN
INTRAMUSCULAR | Status: AC
  Filled 2022-06-23: qty 1

## 2022-06-23 MED ORDER — KETOROLAC TROMETHAMINE 30 MG/ML IJ SOLN
30.0000 mg | Freq: Once | INTRAMUSCULAR | Status: AC
  Administered 2022-06-23: 30 mg via INTRAMUSCULAR

## 2022-06-23 MED ORDER — IBUPROFEN 800 MG PO TABS: 800.0000 mg | ORAL_TABLET | Freq: Two times a day (BID) | ORAL | 0 refills | Status: DC

## 2022-06-23 MED ORDER — METHOCARBAMOL 500 MG PO TABS: 500.0000 mg | ORAL_TABLET | Freq: Two times a day (BID) | ORAL | 0 refills | Status: AC

## 2022-06-23 NOTE — ED Triage Notes (Signed)
Pt is here for leg  pain and swelling causing pain and discomfort x 2days

## 2022-06-23 NOTE — Discharge Instructions (Addendum)
Robaxin was sent to the pharmacy, you may take this 2 times daily, please be mindful that this medication can make you sleepy, please do not operate any heavy machinery or drive a car after taking this medication.  Ibuprofen has been sent to the pharmacy, you may use this 2 times daily starting tomorrow, please make sure to place food on your stomach before taking the ibuprofen.   Rest, ice, compression, and elevation can help with symptom management.  It will help you to rest the affected area.  Use ice to help with inflammation at the site of injury.  You may use a compression bandage or sleeve to provide support for the affected area.  Providing elevation for the affected extremity when able can aid healing.

## 2022-06-23 NOTE — ED Provider Notes (Signed)
[place]    CSN: [id number] Arrival date & time: 06/23/22  1518      History   Chief Complaint Chief Complaint  Patient presents with   Leg Pain    HPI Ashley Spencer is a 37 y.o. female.  Patient presents complaining of left lower leg pain and swelling that started 2 days ago.  Patient denies any fall or trauma.  Patient reports she is uncertain if her symptoms are flared up due to her occupation that involves long hours of standing.  She denies any recent long car rides or long hours sitting before onset of symptoms.  Patient denies any changes to the skin on her leg or any numbness or tingling down her leg.  Patient reports that she has not taken any medications for symptoms.   Patient reports a history of similar symptoms in her opposite calf that has occurred previously, she reports that she has had a muscle strain that had the same exact symptoms, she reports that a muscle relaxant has helped in the past. She denies any history of DVT.   Leg Pain Associated symptoms: no fatigue and no fever     Past Medical History:  Diagnosis Date   Anemia    Arthritis    Headache    History of pyelonephritis 2018   History of sepsis    04-04-2019  urosepsis due to obstructive right ureteral stone   History of trichomonal vaginitis    Nocturia    Right ureteral stone     Patient Active Problem List   Diagnosis Date Noted   Ureteral stone 04/04/2019    Past Surgical History:  Procedure Laterality Date   CYSTOSCOPY W/ URETERAL STENT PLACEMENT Right 04/04/2019   Procedure: CYSTOSCOPY WITH RETROGRADE PYELOGRAM/URETERAL STENT PLACEMENT;  Surgeon: Alexis Frock, MD;  Location: [facility];  Service: Urology;  Laterality: Right;   CYSTOSCOPY WITH RETROGRADE PYELOGRAM, URETEROSCOPY AND STENT PLACEMENT Right 04/22/2019   Procedure: CYSTOSCOPY WITH RETROGRADE PYELOGRAM, DIAGNOSTIC URETEROSCOPY AND STENT EXCHANGE;  Surgeon: Alexis Frock, MD;  Location: [facility];  Service: Urology;  Laterality: Right;  70 MINS    OB History     Gravida  5   Para  3   Term  3   Preterm      AB  2   Living  3      SAB      IAB  2   Ectopic      Multiple      Live Births               Home Medications    Prior to Admission medications   Medication Sig Start Date End Date Taking? Authorizing Provider  ibuprofen (ADVIL) 800 MG tablet Take 1 tablet (800 mg total) by mouth in the morning and at bedtime. 06/23/22  Yes Flossie Dibble, NP  methocarbamol (ROBAXIN) 500 MG tablet Take 1 tablet (500 mg total) by mouth 2 (two) times daily. 06/23/22  Yes Flossie Dibble, NP  clotrimazole-betamethasone (LOTRISONE) cream APPLY TO AFFECTED AREA TWICE A DAY 03/21/22   Ladell Pier, MD  ferrous sulfate 325 (65 FE) MG tablet Take 1 tablet (325 mg total) by mouth daily with breakfast. 03/21/22   Ladell Pier, MD  norethindrone (AYGESTIN) 5 MG tablet Take 1 tablet (5 mg total) by mouth daily. 01/15/22 02/14/22  Fransico Meadow, MD  traMADol (ULTRAM) 50 MG tablet TAKE 1 TABLET BY MOUTH EVERY 8 HOURS  AS NEEDED FOR UP TO 5 DAYS 04/13/22   Ladell Pier, MD    Family History Family History  Problem Relation Age of Onset   Migraines Father 64   Hypertension Brother    Hypertension Maternal Aunt    Hypertension Maternal Uncle    Hypertension Maternal Grandmother     Social History Social History   Tobacco Use   Smoking status: Former    Packs/day: 1.00    Years: 3.00    Total pack years: 3.00    Types: Cigarettes    Quit date: 04/03/2019    Years since quitting: 3.2   Smokeless tobacco: Never  Vaping Use   Vaping Use: Never used  Substance Use Topics   Alcohol use: No   Drug use: No     Allergies   Patient has no known allergies.   Review of Systems Review of Systems  Constitutional:  Positive for activity change. Negative for chills, fatigue and fever.  Respiratory: Negative.  Negative for shortness of  breath.   Cardiovascular:  Positive for leg swelling (LFT calf). Negative for chest pain and palpitations.  Musculoskeletal:  Negative for joint swelling.       Denies any LFT knee pain and Denies any pain behind LFT knee.   Reports LFT calf pain.   Skin:  Negative for color change and rash.     Physical Exam Triage Vital Signs ED Triage Vitals  Enc Vitals Group     BP 06/23/22 1616 (!) 151/96     Pulse Rate 06/23/22 1616 83     Resp 06/23/22 1616 16     Temp 06/23/22 1616 98.2 F (36.8 C)     Temp Source 06/23/22 1616 Oral     SpO2 06/23/22 1616 97 %     Weight --      Height --      Head Circumference --      Peak Flow --      Pain Score 06/23/22 1618 8     Pain Loc --      Pain Edu? --      Excl. in [place]? --    No data found.  Updated Vital Signs BP (!) 151/96 (BP Location: Left Arm)   Pulse 83   Temp 98.2 F (36.8 C) (Oral)   Resp 16   LMP  (LMP Unknown) Comment: pt is curretnly on menstrual  cycle  SpO2 97%    Physical Exam Vitals and nursing note reviewed.  Cardiovascular:     Pulses:          Dorsalis pedis pulses are 2+ on the left side.       Posterior tibial pulses are 2+ on the left side.  Musculoskeletal:     Right lower leg: No swelling, tenderness or bony tenderness. No edema.     Left lower leg: Tenderness present. No swelling or bony tenderness. No edema.       Legs:     Comments: LFT lower leg: Reports tenderness upon palpation of site located on diagram.  Reproducible pain with plantar flexion and dorsiflexion.  No rash, bruising, or erythema present.   No tenderness upon palpation of posterior side of left knee.       UC Treatments / Results  Labs (all labs ordered are listed, but only abnormal results are displayed) Labs Reviewed - No data to display  EKG   Radiology No results found.  Procedures Procedures (including critical care time)  Medications Ordered  in UC Medications  ketorolac (TORADOL) 30 MG/ML injection 30 mg  (has no administration in time range)    Initial Impression / Assessment and Plan / UC Course  I have reviewed the triage vital signs and the nursing notes.  Pertinent labs & imaging results that were available during my care of the patient were reviewed by me and considered in my medical decision making (see chart for details).    Patient was evaluated for strain of left calf.  Ace wrap applied in office.  Toradol injection was given upon request.  Last BMP from 01/15/2022 was reviewed, GFR greater than 60 and creatinine 0.99. Robaxin and ibuprofen was prescribed.  Patient was made aware of treatment regiment.  Patient was made aware of timeline for resolution of symptoms.  Patient was made aware of red flag symptoms that warrant an emergency department visit.  Work note was given.  Patient verbalized understanding of instructions.  Charting was provided using a a verbal dictation system, charting was proofread for errors, errors may occur which could change the meaning of the information charted.   Final Clinical Impressions(s) / UC Diagnoses   Final diagnoses:  Strain of calf muscle, left, initial encounter     Discharge Instructions      Robaxin was sent to the pharmacy, you may take this 2 times daily, please be mindful that this medication can make you sleepy, please do not operate any heavy machinery or drive a car after taking this medication.  Ibuprofen has been sent to the pharmacy, you may use this 2 times daily starting tomorrow, please make sure to place food on your stomach before taking the ibuprofen.   Rest, ice, compression, and elevation can help with symptom management.  It will help you to rest the affected area.  Use ice to help with inflammation at the site of injury.  You may use a compression bandage or sleeve to provide support for the affected area.  Providing elevation for the affected extremity when able can aid healing.      ED Prescriptions     Medication  Sig Dispense Auth. Provider   methocarbamol (ROBAXIN) 500 MG tablet Take 1 tablet (500 mg total) by mouth 2 (two) times daily. 20 tablet Flossie Dibble, NP   ibuprofen (ADVIL) 800 MG tablet Take 1 tablet (800 mg total) by mouth in the morning and at bedtime. 28 tablet Flossie Dibble, NP      PDMP not reviewed this encounter.   Flossie Dibble, NP 06/23/22 2237

## 2022-08-10 ENCOUNTER — Ambulatory Visit: Payer: Self-pay

## 2022-08-10 ENCOUNTER — Other Ambulatory Visit: Payer: Self-pay | Admitting: Internal Medicine

## 2022-08-10 DIAGNOSIS — N946 Dysmenorrhea, unspecified: Secondary | ICD-10-CM

## 2022-08-10 DIAGNOSIS — D259 Leiomyoma of uterus, unspecified: Secondary | ICD-10-CM

## 2022-08-10 MED ORDER — IBUPROFEN 800 MG PO TABS: 800.0000 mg | ORAL_TABLET | Freq: Three times a day (TID) | ORAL | 0 refills | Status: DC | PRN

## 2022-08-10 MED ORDER — TRAMADOL HCL 50 MG PO TABS: 50.0000 mg | ORAL_TABLET | Freq: Three times a day (TID) | ORAL | 0 refills | Status: AC | PRN

## 2022-08-10 NOTE — Telephone Encounter (Signed)
  Chief Complaint: heavy flow, with quarter sized clots Symptoms: menstrual like cramping, changing pad every 32 hours Frequency: yesterday  Pertinent Negatives: Patient denies lightheadedness, dizziness, abd pain, fever Disposition: '[]'$ ED /'[]'$ Urgent Care (no appt availability in office) / '[]'$ Appointment(In office/virtual)/ '[]'$  [facility]/ '[]'$ Home Care/ '[]'$ Refused Recommended Disposition /'[]'$ [place] [facility]/ '[x]'$  Follow-up with PCP Additional Notes: pt requesting Tramadol and Ibuprofen called in. Pt never picked up the      and needs a GYN that takes Medicaid.  Reason for Disposition  SEVERE vaginal bleeding (e.g., soaking 2 pads or tampons per hour and present 2 or more hours; 1 menstrual cup every 2 hours)  Answer Assessment - Initial Assessment Questions 1. AMOUNT: "Describe the bleeding that you are having."    - SPOTTING: spotting, or pinkish / brownish mucous discharge; does not fill panty liner or pad    - MILD:  less than 1 pad / hour; less than patient's usual menstrual bleeding   - MODERATE: 1-2 pads / hour; 1 menstrual cup every 6 hours; small-medium blood clots (e.g., pea, grape, small coin)   - SEVERE: soaking 2 or more pads/hour for 2 or more hours; 1 menstrual cup every 2 hours; bleeding not contained by pads or continuous red blood from vagina; large blood clots (e.g., golf ball, large coin)      Bright red moderate 2. ONSET: "When did the bleeding begin?" "Is it continuing now?"     Yesterday severe 3. MENSTRUAL PERIOD: "When was the last normal menstrual period?" "How is this different than your period?"     07/12/22 lasted 2 weeks back on 2/15-2/20 4. REGULARITY: "How regular are your periods?"     *No Answer* 5. ABDOMEN PAIN: "Do you have any pain?" "How bad is the pain?"  (e.g., Scale 1-10; mild, moderate, or severe)   - MILD (1-3): doesn't interfere with normal activities, abdomen soft and not tender to touch    - MODERATE (4-7): interferes with normal  activities or awakens from sleep, abdomen tender to touch    - SEVERE (8-10): excruciating pain, doubled over, unable to do any normal activities      Severe, lower pain  6. PREGNANCY: "Is there any chance you are pregnant?" "When was your last menstrual period?"     N/a 7. BREASTFEEDING: "Are you breastfeeding?"     N/a 8. HORMONE MEDICINES: "Are you taking any hormone medicines, prescription or over-the-counter?" (e.g., birth control pills, estrogen)     N/a 9. BLOOD THINNER MEDICINES: "Do you take any blood thinners?" (e.g., Coumadin / warfarin, Pradaxa / dabigatran, aspirin)     No  10. CAUSE: "What do you think is causing the bleeding?" (e.g., recent gyn surgery, recent gyn procedure; known bleeding disorder, cervical cancer, polycystic ovarian disease, fibroids)         Tx pain 2 EStyelenol 11. HEMODYNAMIC STATUS: "Are you weak or feeling lightheaded?" If Yes, ask: "Can you stand and walk normally?"        no 12. OTHER SYMPTOMS: "What other symptoms are you having with the bleeding?" (e.g., passed tissue, vaginal discharge, fever, menstrual-type cramps)       cramps  Protocols used: Vaginal Bleeding - Abnormal-A-AH

## 2022-08-10 NOTE — Addendum Note (Signed)
Addended by: Karle Plumber B on: 08/10/2022 06:31 PM   Modules accepted: Orders

## 2022-08-10 NOTE — Telephone Encounter (Signed)
fyi

## 2022-08-10 NOTE — Telephone Encounter (Signed)
Per [facility] pharmacy, patient did pick up Rx in November.   Please advise on additional refills.

## 2022-08-13 NOTE — Telephone Encounter (Signed)
Called but call went straight to operator. Unable to LVM due to VM being full.

## 2022-08-14 NOTE — Addendum Note (Signed)
Addended by: Karle Plumber B on: 08/14/2022 01:27 PM   Modules accepted: Orders

## 2022-08-17 ENCOUNTER — Emergency Department (HOSPITAL_COMMUNITY)
Admission: EM | Admit: 2022-08-17 | Discharge: 2022-08-17 | Disposition: A | Discharge status: Home / Self Care | Payer: Medicaid Other | Attending: Emergency Medicine | Admitting: Emergency Medicine

## 2022-08-17 ENCOUNTER — Other Ambulatory Visit: Payer: Self-pay

## 2022-08-17 ENCOUNTER — Encounter (HOSPITAL_COMMUNITY): Payer: Self-pay

## 2022-08-17 DIAGNOSIS — R509 Fever, unspecified: Secondary | ICD-10-CM | POA: Insufficient documentation

## 2022-08-17 DIAGNOSIS — K047 Periapical abscess without sinus: Secondary | ICD-10-CM | POA: Diagnosis not present

## 2022-08-17 DIAGNOSIS — K0889 Other specified disorders of teeth and supporting structures: Secondary | ICD-10-CM | POA: Diagnosis present

## 2022-08-17 DIAGNOSIS — Z87891 Personal history of nicotine dependence: Secondary | ICD-10-CM | POA: Diagnosis not present

## 2022-08-17 MED ORDER — AMOXICILLIN-POT CLAVULANATE 875-125 MG PO TABS: 1.0000 | ORAL_TABLET | Freq: Two times a day (BID) | ORAL | 0 refills | Status: DC

## 2022-08-17 MED ORDER — OXYCODONE HCL 5 MG PO TABS: 2.5000 mg | ORAL_TABLET | Freq: Four times a day (QID) | ORAL | 0 refills | Status: AC | PRN

## 2022-08-17 MED ORDER — NAPROXEN 375 MG PO TABS: 375.0000 mg | ORAL_TABLET | Freq: Two times a day (BID) | ORAL | 0 refills | Status: DC

## 2022-08-17 MED ORDER — OXYCODONE-ACETAMINOPHEN 5-325 MG PO TABS
2.0000 | ORAL_TABLET | Freq: Once | ORAL | Status: AC
  Administered 2022-08-17: 2 via ORAL
  Filled 2022-08-17: qty 2

## 2022-08-17 NOTE — ED Triage Notes (Signed)
States that she has abscess on the left side of her lower jaw, she states that the swelling got worse today, she is also having a fever, and increased pain

## 2022-08-17 NOTE — Discharge Instructions (Addendum)
Contact a health care provider if: Your pain is worse and is not helped by medicine. You have swelling. You see pus around the tooth. You have a fever or chills. Get help right away if: Your symptoms suddenly get worse. You have a very bad headache. You have problems breathing or swallowing. You have trouble opening your mouth. You have swelling in your neck or around your eye. These symptoms may represent a serious problem that is an emergency. Do not wait to see if the symptoms will go away. Get medical help right away. Call your local emergency services (911 in the [place] Do not drive yourself to the hospital.

## 2022-08-17 NOTE — ED Provider Notes (Signed)
History of Present Illness   Patient Identification Ashley Spencer is a 37 y.o. female.  Patient information was obtained from patient. History/Exam limitations: none. Patient presented to the Emergency Department by private vehicle.  Chief Complaint  Oral Swelling   Patient who presents with complaint of toothache. Onset of symptoms was gradual starting 3 days ago. Patient describes pain as aching and throbbing. Pain severity at onset was moderate and now is severe, unremitting. The pain does not radiate. Patient has jaw swelling and has had subjective fevers. Pain is aggravated by movement and palpation. Pain is alleviated by nothing. The patient denies other complaints. Patient has not sought treatment by another care provider for this problem. Care prior to arrival consisted of NSAID, acetaminophen, elevation, and heat, with no relief.   Past Medical History:  Diagnosis Date   Anemia    Arthritis    Headache    History of pyelonephritis 2018   History of sepsis    04-04-2019  urosepsis due to obstructive right ureteral stone   History of trichomonal vaginitis    Nocturia    Right ureteral stone    Family History  Problem Relation Age of Onset   Migraines Father 71   Hypertension Brother    Hypertension Maternal Aunt    Hypertension Maternal Uncle    Hypertension Maternal Grandmother    Scheduled Meds:  oxyCODONE-acetaminophen  2 tablet Oral Once   Continuous Infusions: PRN Meds:  No Known Allergies Social History   Socioeconomic History   Marital status: Single    Spouse name: Not on file   Number of children: 3   Years of education: 11th   Highest education level: Not on file  Occupational History    Employer: OTHER    Comment: n/a  Tobacco Use   Smoking status: Former    Packs/day: 1.00    Years: 3.00    Total pack years: 3.00    Types: Cigarettes    Quit date: 04/03/2019    Years since quitting: 3.3   Smokeless tobacco: Never  Vaping Use    Vaping Use: Never used  Substance and Sexual Activity   Alcohol use: No   Drug use: No   Sexual activity: Yes    Birth control/protection: None  Other Topics Concern   Not on file  Social History Narrative   Patient lives at home with her grandmother.   Caffeine Use: 1 cup every other morning   Social Determinants of Health   Financial Resource Strain: Not on file  Food Insecurity: Not on file  Transportation Needs: Not on file  Physical Activity: Not on file  Stress: Not on file  Social Connections: Not on file  Intimate Partner Violence: Not on file   Review of Systems  Physical Exam   BP (!) 171/106   Pulse 98   Temp 98.8 F (37.1 C) (Oral)   Resp 18   Ht 5' (1.524 m)   Wt 50.8 kg   SpO2 100%   BMI 21.87 kg/m  BP (!) 171/106   Pulse 98   Temp 98.8 F (37.1 C) (Oral)   Resp 18   Ht 5' (1.524 m)   Wt 50.8 kg   SpO2 100%   BMI 21.87 kg/m   General Appearance:    Alert, cooperative, no distress, appears stated age  Head:    Normocephalic, without obvious abnormality, atraumatic  Eyes:    PERRL, conjunctiva/corneas clear, EOM's intact, fundi    benign, both eyes  Ears:    Normal TM's and external ear canals, both ears  Nose:   Nares normal, septum midline, mucosa normal, no drainage    or sinus tenderness  Throat:   Lips, mucosa, and tongue normal, dental caries, left third molar on the lower side is fractured.  There is surrounding erythema and some gum recession.  She has exquisite tenderness to palpation of the gingiva and there is large almost golf ball sized swelling over the  Neck:   Supple, symmetrical, trachea midline, no adenopathy;    thyroid:  no enlargement/tenderness/nodules; no carotid   bruit or JVD  Back:     Symmetric, no curvature, ROM normal, no CVA tenderness  Lungs:     Clear to auscultation bilaterally, respirations unlabored  Chest Wall:    No tenderness or deformity   Heart:    Regular rate and rhythm, S1 and S2 normal, no murmur,  rub   or gallop  Breast Exam:    No tenderness, masses, or nipple abnormality  Abdomen:     Soft, non-tender, bowel sounds active all four quadrants,    no masses, no organomegaly  Genitalia:    Normal female without lesion, discharge or tenderness  Rectal:    Normal tone, normal prostate, no masses or tenderness;   guaiac negative stool  Extremities:   Extremities normal, atraumatic, no cyanosis or edema  Pulses:   2+ and symmetric all extremities  Skin:   Skin color, texture, turgor normal, no rashes or lesions  Lymph nodes:   Cervical, supraclavicular, and axillary nodes normal  Neurologic:   CNII-XII intact, normal strength, sensation and reflexes    throughout     ED Course   Studies:   Records Reviewed: Old medical records.  Treatments: pain medication (PDMP reviewed during this encounter. )  Consultations: none  Disposition: Home Referral to dentist, abx, pain meds and strict return precautions   Arthor Captain, PA-C 08/17/22 2347    Benjiman Core, MD 08/20/22 [phone]

## 2022-08-17 NOTE — Telephone Encounter (Signed)
Called but no answer. LVM is full.

## 2022-08-20 NOTE — Telephone Encounter (Signed)
Called but no answer. Unable to LVM due to VM being full.

## 2022-08-21 NOTE — Telephone Encounter (Signed)
Called but no answer. LVM to call back.

## 2022-10-02 ENCOUNTER — Other Ambulatory Visit: Payer: Self-pay | Admitting: Internal Medicine

## 2022-10-02 NOTE — Telephone Encounter (Signed)
Requested Prescriptions  Pending Prescriptions Disp Refills   ibuprofen (ADVIL) 800 MG tablet [Pharmacy Med Name: IBUPROFEN 800 MG TABLET] 60 tablet 0    Sig: TAKE 1 TABLET BY MOUTH EVERY 8 HOURS AS NEEDED     Analgesics:  NSAIDS Failed - 10/02/2022  1:27 PM      Failed - Manual Review: Labs are only required if the patient has taken medication for more than 8 weeks.      Failed - HGB in normal range and within 360 days    Hemoglobin  Date Value Ref Range Status  01/15/2022 10.8 (L) 12.0 - 15.0 g/dL Final  16/03/9603 54.0 11.1 - 15.9 g/dL Final         Failed - HCT in normal range and within 360 days    HCT  Date Value Ref Range Status  01/15/2022 33.8 (L) 36.0 - 46.0 % Final   Hematocrit  Date Value Ref Range Status  12/03/2019 36.3 34.0 - 46.6 % Final         Passed - Cr in normal range and within 360 days    Creatinine, Ser  Date Value Ref Range Status  01/15/2022 0.99 0.44 - 1.00 mg/dL Final         Passed - PLT in normal range and within 360 days    Platelets  Date Value Ref Range Status  01/15/2022 395 150 - 400 K/uL Final  12/03/2019 345 150 - 450 x10E3/uL Final         Passed - eGFR is 30 or above and within 360 days    GFR calc Af Amer  Date Value Ref Range Status  04/06/2019 >60 >60 mL/min Final   GFR, Estimated  Date Value Ref Range Status  01/15/2022 >60 >60 mL/min Final    Comment:    (NOTE) Calculated using the CKD-EPI Creatinine Equation (2021)          Passed - Patient is not pregnant      Passed - Valid encounter within last 12 months    Recent Outpatient Visits           6 months ago Menorrhagia with regular cycle   [place] [facility] Marcine Matar, MD   1 year ago Tinea pedis of both feet   [place] [facility] Marcine Matar, MD   2 years ago No-show for appointment   [facility] [place], [place] J, NP   2 years ago Dysfunctional uterine  bleeding   [place] [facility] Marcine Matar, MD   3 years ago Secondary amenorrhea   [place] [facility] Marcine Matar, MD

## 2022-11-20 ENCOUNTER — Encounter: Payer: Medicaid Other | Admitting: Obstetrics and Gynecology

## 2022-11-24 IMAGING — DX DG CERVICAL SPINE COMPLETE 4+V
5 series · 5 of 5 positions shown · non-contrast
Comparison: No prior.

CLINICAL DATA: Severe neck pain.

EXAM:
CERVICAL SPINE - COMPLETE 4+ VIEW

[c-spine lat]
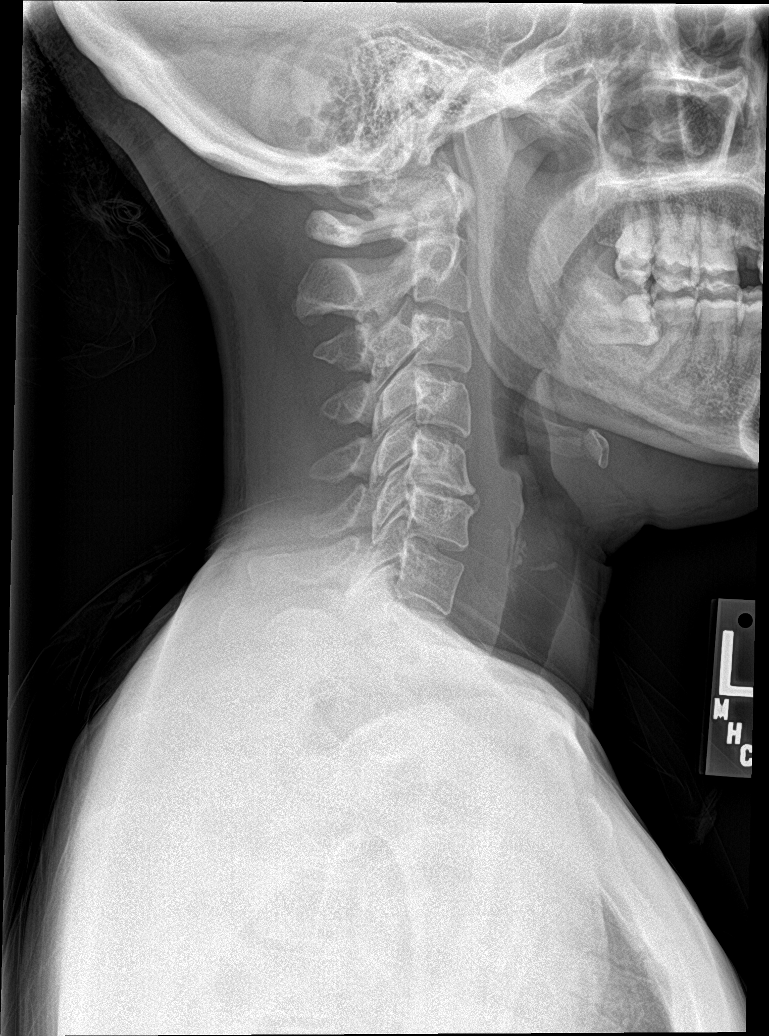

[c-spine obl (1 of 2)]
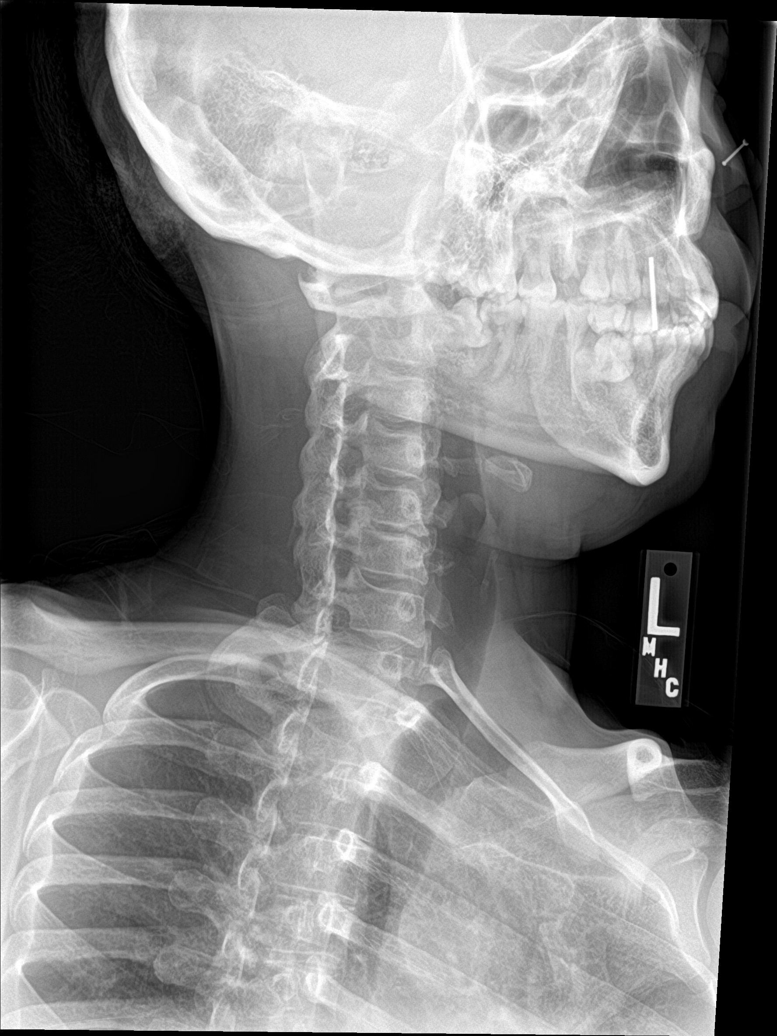

[c-spine obl (2 of 2)]
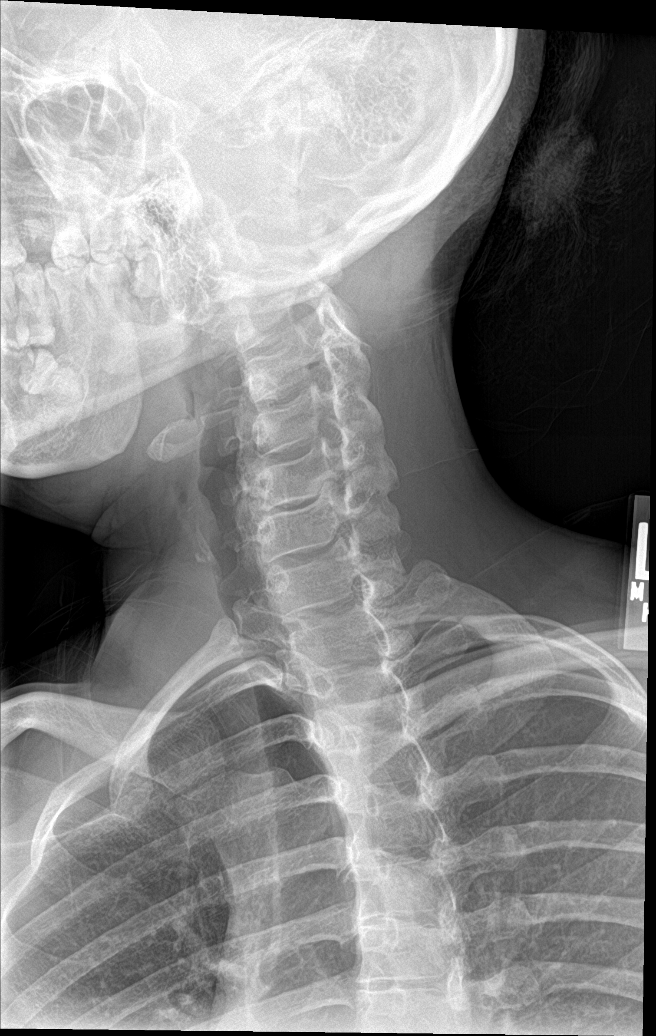

[c-spine ap]
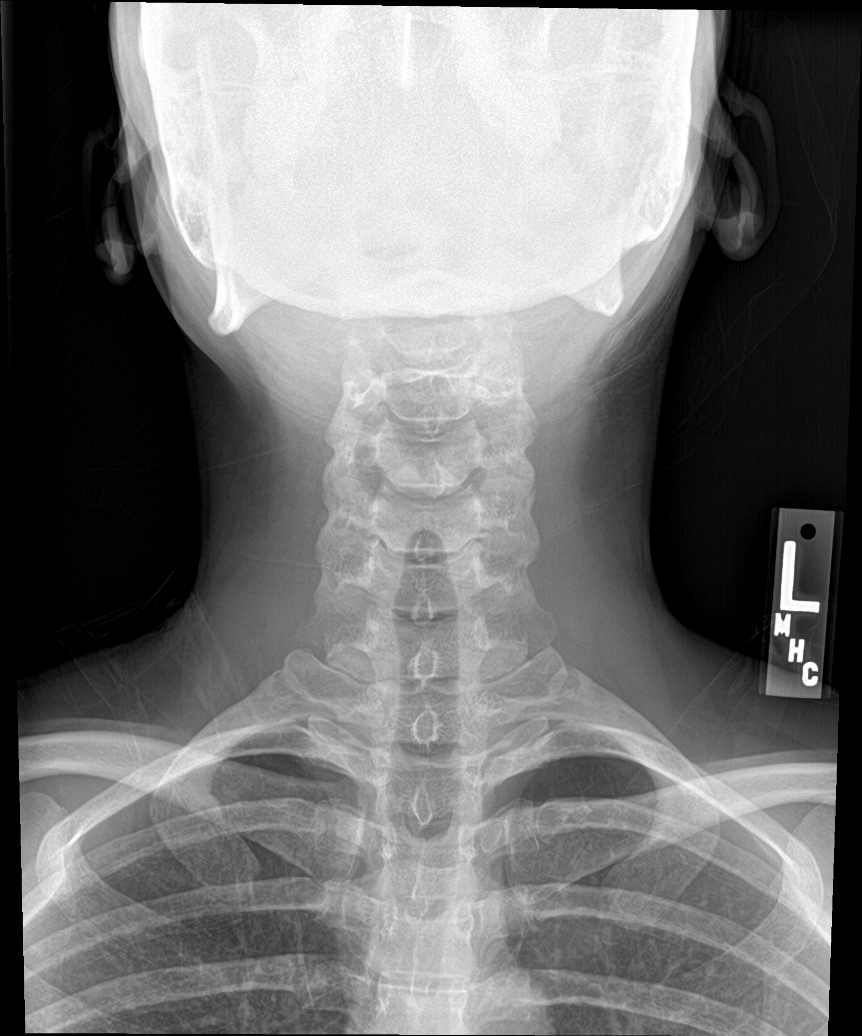

[c-spine open mouth]
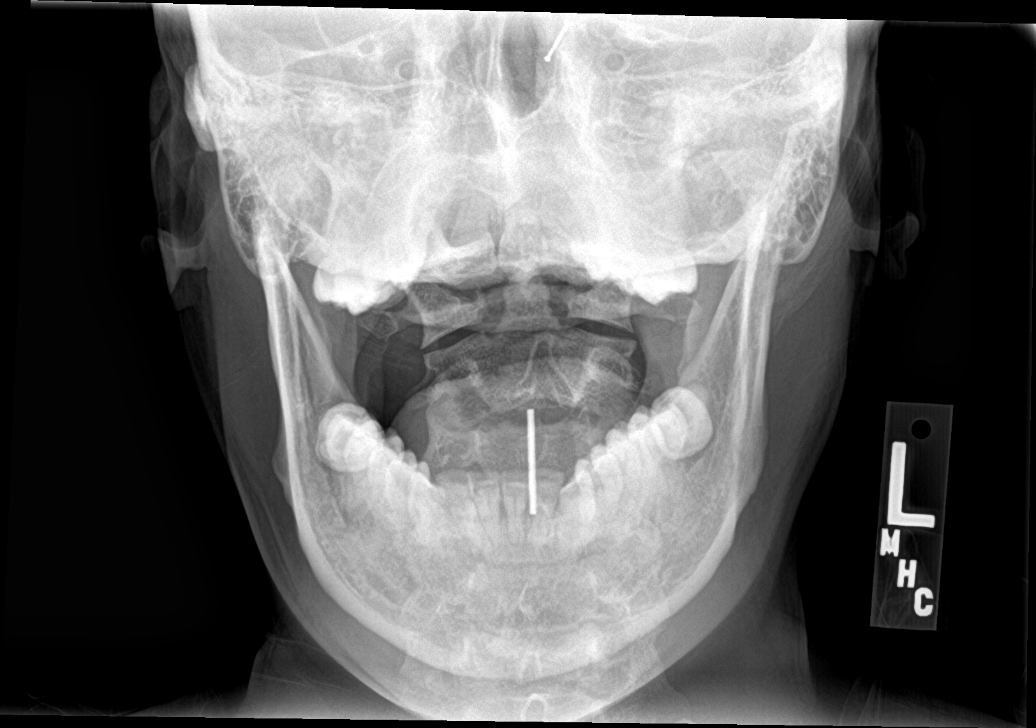

[5 of 5 positions shown; findings below may reference images not displayed]

FINDINGS: C5-C6 prominent disc degeneration and endplate osteophyte formation.
No acute soft tissue bony abnormality. No evidence of fracture
dislocation. No acute abnormality identified.
IMPRESSION: C5-C6 prominent disc degeneration and endplate osteophyte formation.
No acute abnormality.

## 2022-11-28 ENCOUNTER — Other Ambulatory Visit: Payer: Self-pay | Admitting: Internal Medicine

## 2023-08-19 ENCOUNTER — Encounter (HOSPITAL_COMMUNITY): Payer: Self-pay | Admitting: Emergency Medicine

## 2023-08-19 ENCOUNTER — Ambulatory Visit (HOSPITAL_COMMUNITY)
Admission: EM | Admit: 2023-08-19 | Discharge: 2023-08-19 | Disposition: A | Discharge status: Home / Self Care | Attending: Internal Medicine | Admitting: Internal Medicine

## 2023-08-19 DIAGNOSIS — K047 Periapical abscess without sinus: Secondary | ICD-10-CM

## 2023-08-19 DIAGNOSIS — S025XXA Fracture of tooth (traumatic), initial encounter for closed fracture: Secondary | ICD-10-CM

## 2023-08-19 MED ORDER — AMOXICILLIN-POT CLAVULANATE 875-125 MG PO TABS: 1.0000 | ORAL_TABLET | Freq: Two times a day (BID) | ORAL | 0 refills | Status: AC

## 2023-08-19 MED ORDER — ACETAMINOPHEN 325 MG PO TABS
ORAL_TABLET | ORAL | Status: AC
  Filled 2023-08-19: qty 3

## 2023-08-19 MED ORDER — KETOROLAC TROMETHAMINE 30 MG/ML IJ SOLN
30.0000 mg | Freq: Once | INTRAMUSCULAR | Status: AC
  Administered 2023-08-19: 30 mg via INTRAMUSCULAR

## 2023-08-19 MED ORDER — IBUPROFEN 800 MG PO TABS: 800.0000 mg | ORAL_TABLET | Freq: Three times a day (TID) | ORAL | 0 refills | Status: DC

## 2023-08-19 MED ORDER — KETOROLAC TROMETHAMINE 30 MG/ML IJ SOLN
INTRAMUSCULAR | Status: AC
  Filled 2023-08-19: qty 1

## 2023-08-19 MED ORDER — ACETAMINOPHEN 325 MG PO TABS
975.0000 mg | ORAL_TABLET | Freq: Once | ORAL | Status: AC
  Administered 2023-08-19: 975 mg via ORAL

## 2023-08-19 NOTE — Discharge Instructions (Signed)
 Your dental pain is likely due to dental infection. Take  antibiotic as prescribed for the next 7 days to treat your dental infection. Continue use of ibuprofen as needed with food for dental inflammation and pain.   You may also use tylenol as needed for pain. Perform salt water gargles every 3-4 hours.  Keep appointment with dentist for Friday!   If you develop any new or worsening symptoms or if your symptoms do not start to improve, pleases return here or follow-up with your primary care provider. If your symptoms are severe, please go to the emergency room.

## 2023-08-19 NOTE — ED Triage Notes (Signed)
 Pt c/o dental pain and swelling on left side for 2 days. Reports part of tooth broke off last night. Has dentist appt on Friday. Hasn't taken anything today for pain

## 2023-08-19 NOTE — ED Provider Notes (Addendum)
 [facility]    CSN: [id number] Arrival date & time: 08/19/23  3086      History   Chief Complaint Chief Complaint  Patient presents with   Dental Pain    HPI Ashley Spencer is a 38 y.o. female.   Patient presents to urgent care for evaluation of pain to the left upper mouth/tooth that started 2 days ago.  She states part of her tooth broke off yesterday increasing pain.  Trauma or injury to the mouth.  Pain is worsened by hot/cold foods and by pressure to the affected area.  Denies fevers, chills, nausea, vomiting, difficulty maintaining secretions without drooling, sore throat, and jaw pain/neck pain.  Denies recent antibiotic/steroid use and history of immunosuppression.  She has a dentist appointment in 4 days.      Past Medical History:  Diagnosis Date   Anemia    Arthritis    Headache    History of pyelonephritis 2018   History of sepsis    04-04-2019  urosepsis due to obstructive right ureteral stone   History of trichomonal vaginitis    Nocturia    Right ureteral stone     Patient Active Problem List   Diagnosis Date Noted   Ureteral stone 04/04/2019    Past Surgical History:  Procedure Laterality Date   CYSTOSCOPY W/ URETERAL STENT PLACEMENT Right 04/04/2019   Procedure: CYSTOSCOPY WITH RETROGRADE PYELOGRAM/URETERAL STENT PLACEMENT;  Surgeon: Sebastian Ache, MD;  Location: [facility];  Service: Urology;  Laterality: Right;   CYSTOSCOPY WITH RETROGRADE PYELOGRAM, URETEROSCOPY AND STENT PLACEMENT Right 04/22/2019   Procedure: CYSTOSCOPY WITH RETROGRADE PYELOGRAM, DIAGNOSTIC URETEROSCOPY AND STENT EXCHANGE;  Surgeon: Sebastian Ache, MD;  Location: [facility];  Service: Urology;  Laterality: Right;  60 MINS    OB History     Gravida  5   Para  3   Term  3   Preterm      AB  2   Living  3      SAB      IAB  2   Ectopic      Multiple      Live Births               Home Medications    Prior to  Admission medications   Medication Sig Start Date End Date Taking? Authorizing Provider  amoxicillin-clavulanate (AUGMENTIN) 875-125 MG tablet Take 1 tablet by mouth every 12 (twelve) hours. 08/19/23  Yes Carlisle Beers, FNP  ibuprofen (ADVIL) 800 MG tablet Take 1 tablet (800 mg total) by mouth 3 (three) times daily. 08/19/23  Yes Carlisle Beers, FNP  clotrimazole-betamethasone (LOTRISONE) cream APPLY TO AFFECTED AREA TWICE A DAY 03/21/22   Marcine Matar, MD  ferrous sulfate 325 (65 FE) MG tablet Take 1 tablet (325 mg total) by mouth daily with breakfast. 03/21/22   Marcine Matar, MD  methocarbamol (ROBAXIN) 500 MG tablet Take 1 tablet (500 mg total) by mouth 2 (two) times daily. 06/23/22   Debby Freiberg, NP  naproxen (NAPROSYN) 375 MG tablet Take 1 tablet (375 mg total) by mouth 2 (two) times daily with a meal. 08/17/22   Harris, Abigail, PA-C  norethindrone (AYGESTIN) 5 MG tablet Take 1 tablet (5 mg total) by mouth daily. 01/15/22 02/14/22  Rondel Baton, MD  oxyCODONE (ROXICODONE) 5 MG immediate release tablet Take 0.5-1 tablets (2.5-5 mg total) by mouth every 6 (six) hours as needed for severe pain. 08/17/22  Arthor Captain, PA-C    Family History Family History  Problem Relation Age of Onset   Migraines Father 31   Hypertension Brother    Hypertension Maternal Aunt    Hypertension Maternal Uncle    Hypertension Maternal Grandmother     Social History Social History   Tobacco Use   Smoking status: Former    Current packs/day: 0.00    Average packs/day: 1 pack/day for 3.0 years (3.0 ttl pk-yrs)    Types: Cigarettes    Start date: 04/02/2016    Quit date: 04/03/2019    Years since quitting: 4.3   Smokeless tobacco: Never  Vaping Use   Vaping status: Never Used  Substance Use Topics   Alcohol use: No   Drug use: No     Allergies   Patient has no known allergies.   Review of Systems Review of Systems Per HPI  Physical Exam Triage Vital  Signs ED Triage Vitals  Encounter Vitals Group     BP 08/19/23 1048 130/82     Systolic BP Percentile --      Diastolic BP Percentile --      Pulse Rate 08/19/23 1048 72     Resp 08/19/23 1048 15     Temp 08/19/23 1048 98.1 F (36.7 C)     Temp Source 08/19/23 1048 Oral     SpO2 08/19/23 1048 100 %     Weight --      Height --      Head Circumference --      Peak Flow --      Pain Score 08/19/23 1047 10     Pain Loc --      Pain Education --      Exclude from Growth Chart --    No data found.  Updated Vital Signs BP 130/82 (BP Location: Left Arm)   Pulse 72   Temp 98.1 F (36.7 C) (Oral)   Resp 15   LMP 08/18/2023   SpO2 100%   Visual Acuity Right Eye Distance:   Left Eye Distance:   Bilateral Distance:    Right Eye Near:   Left Eye Near:    Bilateral Near:     Physical Exam Vitals and nursing note reviewed.  Constitutional:      Appearance: She is not ill-appearing or toxic-appearing.  HENT:     Head: Normocephalic and atraumatic.     Right Ear: Hearing and external ear normal.     Left Ear: Hearing and external ear normal.     Nose: Nose normal.     Mouth/Throat:     Lips: Pink.     Mouth: Mucous membranes are moist. No injury or oral lesions.     Dentition: Abnormal dentition (Multiple missing teeth). Dental tenderness and dental caries present. No dental abscesses.     Tongue: No lesions.     Pharynx: Oropharynx is clear. Uvula midline. No pharyngeal swelling, oropharyngeal exudate, posterior oropharyngeal erythema, uvula swelling or postnasal drip.     Tonsils: No tonsillar exudate.      Comments: No trismus, phonation normal, maintaining secretions without difficulty.   Eyes:     General: Lids are normal. Vision grossly intact. Gaze aligned appropriately.     Extraocular Movements: Extraocular movements intact.     Conjunctiva/sclera: Conjunctivae normal.  Neck:     Trachea: Trachea and phonation normal.  Cardiovascular:     Rate and Rhythm:  Normal rate and regular rhythm.     Heart sounds:  Normal heart sounds, S1 normal and S2 normal.  Pulmonary:     Effort: Pulmonary effort is normal. No respiratory distress.     Breath sounds: Normal breath sounds and air entry.  Musculoskeletal:     Cervical back: Neck supple.  Lymphadenopathy:     Cervical: Cervical adenopathy present.     Right cervical: Superficial cervical adenopathy present.     Left cervical: Superficial cervical adenopathy and deep cervical adenopathy present.  Skin:    General: Skin is warm and dry.     Capillary Refill: Capillary refill takes less than 2 seconds.     Findings: No rash.  Neurological:     General: No focal deficit present.     Mental Status: She is alert and oriented to person, place, and time. Mental status is at baseline.     Cranial Nerves: No dysarthria or facial asymmetry.  Psychiatric:        Mood and Affect: Mood normal.        Speech: Speech normal.        Behavior: Behavior normal.        Thought Content: Thought content normal.        Judgment: Judgment normal.      UC Treatments / Results  Labs (all labs ordered are listed, but only abnormal results are displayed) Labs Reviewed - No data to display  EKG   Radiology No results found.  Procedures Procedures (including critical care time)  Medications Ordered in UC Medications  ketorolac (TORADOL) 30 MG/ML injection 30 mg (30 mg Intramuscular Given 08/19/23 1125)  acetaminophen (TYLENOL) tablet 975 mg (975 mg Oral Given 08/19/23 1125)    Initial Impression / Assessment and Plan / UC Course  I have reviewed the triage vital signs and the nursing notes.  Pertinent labs & imaging results that were available during my care of the patient were reviewed by me and considered in my medical decision making (see chart for details).   1. Dental infection, closed fracture of tooth Evaluation suggests dental pain secondary to dental infection.   HEENT exam stable and without  red flag signs indicating need for advanced imaging/further emergent workup. Patient is afebrile, nontoxic in appearance, and with hemodynamically stable vital signs.   Antibiotic ordered.  Recommend supportive care for symptomatic relief as outlined in AVS.  Toradol 30 mg IM and Tylenol 975 mg ordered to be administered in clinic. No NSAIDs for 24 hours. May have ibuprofen 800 mg every 8 hours as needed for dental pain and inflammation after 24 hours.  Information for low cost community dental resources provided.  Encouraged to follow-up with dentist for further management.    Counseled patient on potential for adverse effects with medications prescribed/recommended today, strict ER and return-to-clinic precautions discussed, patient verbalized understanding.    Final Clinical Impressions(s) / UC Diagnoses   Final diagnoses:  Dental infection  Closed fracture of tooth, initial encounter     Discharge Instructions      Your dental pain is likely due to dental infection. Take  antibiotic as prescribed for the next 7 days to treat your dental infection. Continue use of ibuprofen as needed with food for dental inflammation and pain.   You may also use tylenol as needed for pain. Perform salt water gargles every 3-4 hours.  Keep appointment with dentist for Friday!   If you develop any new or worsening symptoms or if your symptoms do not start to improve, pleases return here or follow-up with  your primary care provider. If your symptoms are severe, please go to the emergency room.    ED Prescriptions     Medication Sig Dispense Auth. Provider   amoxicillin-clavulanate (AUGMENTIN) 875-125 MG tablet Take 1 tablet by mouth every 12 (twelve) hours. 14 tablet Reita May M, FNP   ibuprofen (ADVIL) 800 MG tablet Take 1 tablet (800 mg total) by mouth 3 (three) times daily. 21 tablet Carlisle Beers, FNP      PDMP not reviewed this encounter.   Carlisle Beers,  FNP 08/19/23 1128    Carlisle Beers, [place] 08/19/23 1128

## 2023-09-06 ENCOUNTER — Ambulatory Visit: Payer: Medicaid Other | Admitting: Internal Medicine

## 2023-09-22 ENCOUNTER — Emergency Department (HOSPITAL_COMMUNITY)
Admission: EM | Admit: 2023-09-22 | Discharge: 2023-09-22 | Discharge status: Against Medical Advice | Attending: Emergency Medicine | Admitting: Emergency Medicine

## 2023-09-22 ENCOUNTER — Encounter (HOSPITAL_COMMUNITY)

## 2023-09-22 ENCOUNTER — Other Ambulatory Visit: Payer: Self-pay

## 2023-09-22 DIAGNOSIS — Z5321 Procedure and treatment not carried out due to patient leaving prior to being seen by health care provider: Secondary | ICD-10-CM | POA: Diagnosis not present

## 2023-09-22 DIAGNOSIS — M79662 Pain in left lower leg: Secondary | ICD-10-CM | POA: Diagnosis present

## 2023-09-22 DIAGNOSIS — M7989 Other specified soft tissue disorders: Secondary | ICD-10-CM | POA: Insufficient documentation

## 2023-09-22 NOTE — ED Triage Notes (Signed)
 Pt c/o left lower leg pain, swelling, and hot to touch.

## 2023-09-22 NOTE — ED Notes (Signed)
 Patient yells out "I have waited a whole hour, this is ridiculous, I have to go pick up my child" Patient tells nurse she is leaving to get her child as she leaves.

## 2023-10-11 ENCOUNTER — Ambulatory Visit: Payer: Self-pay

## 2023-10-11 NOTE — Telephone Encounter (Signed)
 Copied from CRM [id number]. Topic: Clinical - Red Word Triage >> Oct 11, 2023 12:32 PM Yolanda T wrote: Red Word that prompted transfer to Nurse Triage: left calf swollen and painful and it has gotten worse over the last  Chief Complaint:leg pain, leg swelling Symptoms: left calf 7/10 pain, mild swelling Frequency: on and off x 2 months Pertinent Negatives: Patient denies SOB, chest  Disposition: [] ED /[] Urgent Care (no appt availability in office) / [x] Appointment(In office/virtual)/ []  [place] [facility]/ [] Home Care/ [] Refused Recommended Disposition /[] [place] [facility]/ []  Follow-up with PCP Additional Notes: pt states pain and swelling comes and goes. Stated mostly bothers pt when attempting to stretch or rub area.  Pt stated she went for an xray few days ago and they made her wait so long she got up and left and now would like to schedule with PCP.  No close appt available: appt made on 11/01/2023 with different provider: pt would like to worked into PCP schedule before scheduled appt if possible: please call pt to schedule. Reason for Disposition  [1] MILD swelling of both ankles (i.e., pedal edema) AND [2] new-onset or worsening  [1] MODERATE pain (e.g., interferes with normal activities, limping) AND [2] present > 3 days  Answer Assessment - Initial Assessment Questions 1. ONSET: "When did the pain start?"      X 2 months 2. LOCATION: "Where is the pain located?"      Left calf  3. PAIN: "How bad is the pain?"    (Scale 1-10; or mild, moderate, severe)   -  MILD (1-3): doesn't interfere with normal activities    -  MODERATE (4-7): interferes with normal activities (e.g., work or school) or awakens from sleep, limping    -  SEVERE (8-10): excruciating pain, unable to do any normal activities, unable to walk     7/10 4. WORK OR EXERCISE: "Has there been any recent work or exercise that involved this part of the body?"      no 5. CAUSE: "What do you think is causing the  leg pain?"     unknown 6. OTHER SYMPTOMS: "Do you have any other symptoms?" (e.g., chest pain, back pain, breathing difficulty, swelling, rash, fever, numbness, weakness)     Mild swelling - "like small water  balloon",  7. PREGNANCY: "Is there any chance you are pregnant?" "When was your last menstrual period?"     N/a  Answer Assessment - Initial Assessment Questions 1. ONSET: "When did the swelling start?" (e.g., minutes, hours, days)     X 2 month 2. LOCATION: "What part of the leg is swollen?"  "Are both legs swollen or just one leg?"     Left calf 3. SEVERITY: "How bad is the swelling?" (e.g., localized; mild, moderate, severe)   - Localized: Small area of swelling localized to one leg.   - MILD pedal edema: Swelling limited to foot and ankle, pitting edema < 1/4 inch (6 mm) deep, rest and elevation eliminate most or all swelling.   - MODERATE edema: Swelling of lower leg to knee, pitting edema > 1/4 inch (6 mm) deep, rest and elevation only partially reduce swelling.   - SEVERE edema: Swelling extends above knee, facial or hand swelling present.      mild 4. REDNESS: "Does the swelling look red or infected?"     no 5. PAIN: "Is the swelling painful to touch?" If Yes, ask: "How painful is it?"   (Scale 1-10; mild, moderate or severe)  7/10 6. FEVER: "Do you have a fever?" If Yes, ask: "What is it, how was it measured, and when did it start?"      no 7. CAUSE: "What do you think is causing the leg swelling?"     unknown 8. MEDICAL HISTORY: "Do you have a history of blood clots (e.g., DVT), cancer, heart failure, kidney disease, or liver failure?"     N/a 9. RECURRENT SYMPTOM: "Have you had leg swelling before?" If Yes, ask: "When was the last time?" "What happened that time?"     no 10. OTHER SYMPTOMS: "Do you have any other symptoms?" (e.g., chest pain, difficulty breathing)       no 11. PREGNANCY: "Is there any chance you are pregnant?" "When was your last menstrual  period?"       N/a  Protocols used: Leg Pain-A-AH, Leg Swelling and Edema-A-AH

## 2023-10-24 ENCOUNTER — Ambulatory Visit (INDEPENDENT_AMBULATORY_CARE_PROVIDER_SITE_OTHER): Payer: Self-pay | Admitting: Primary Care

## 2023-11-01 ENCOUNTER — Ambulatory Visit: Payer: Self-pay | Admitting: Nurse Practitioner

## 2024-01-08 ENCOUNTER — Ambulatory Visit: Payer: Self-pay

## 2024-01-08 NOTE — Telephone Encounter (Signed)
 FYI Only or Action Required?: Action required by provider: request for appointment and clinical question for provider.  Patient was last seen in primary care on 03/20/2022 by Vicci Barnie NOVAK, MD.  Called Nurse Triage reporting No chief complaint on file..  Symptoms began several days ago.  Interventions attempted: Nothing.  Symptoms are: gradually improving.  Triage Disposition: No disposition on file.  Patient/caregiver understands and will follow disposition?:  Answer Assessment - Initial Assessment Questions Scheduled for soonest available appt in Sept, patient refused earlier appt with a different provider. ED precautions reviewed, pt verbalized understanding. To call back with any worsening or new symptoms. Message to be routed to PCP for further advice d/t hx of fibroid tumor and to request earlier appt if possible.   1. BLEEDING SEVERITY: Describe the bleeding that you are having. How much bleeding is there?      Passed a clot on 01/05/24, now has minor spotting  2. ONSET: When did the bleeding begin? Is it continuing now?     01/05/24  3. MENSTRUAL PERIOD: When was the last normal menstrual period? How is this different than your period?     July 1-5  4. REGULARITY: How regular are your periods?     Regular  5. ABDOMEN PAIN: Do you have any pain? How bad is the pain?  (e.g., Scale 0-10; none, mild, moderate, or severe)     Had abdominal discomfort on 01/05/24 that has resolved  6. PREGNANCY: Is there any chance you are pregnant? When was your last menstrual period?     Denies  11. HEMODYNAMIC STATUS: Are you weak or feeling lightheaded? If Yes, ask: Can you stand and walk normally?        Denies  12. OTHER SYMPTOMS: What other symptoms are you having with the bleeding? (e.g., passed tissue, vaginal discharge, fever, menstrual-type cramps)       Denies  Protocols used: Vaginal Bleeding - Abnormal-A-AH Copied from CRM #[id number]. Topic:  Clinical - Red Word Triage >> Jan 08, 2024  1:02 PM Charlet HERO wrote: Red Word that prompted transfer to Nurse Triage: patient is calling about passing large blood clots she is stting that she has a fibroid tumor, she has a few cramps first and then after it past she did not feel the cramp she is not on her cycle and now she has light bleeding redish pink color.

## 2024-01-09 NOTE — Telephone Encounter (Signed)
 Call to patient to advise that there are no earlier appointment with her PCP. We do have availability with another provider and on our MU . Call went to VM that had not been set -up yet.

## 2024-02-10 ENCOUNTER — Encounter (HOSPITAL_COMMUNITY): Payer: Self-pay

## 2024-02-10 ENCOUNTER — Emergency Department (HOSPITAL_COMMUNITY)

## 2024-02-10 ENCOUNTER — Other Ambulatory Visit: Payer: Self-pay

## 2024-02-10 ENCOUNTER — Emergency Department (HOSPITAL_COMMUNITY)
Admission: EM | Admit: 2024-02-10 | Discharge: 2024-02-10 | Disposition: A | Discharge status: Home / Self Care | Attending: Emergency Medicine | Admitting: Emergency Medicine

## 2024-02-10 DIAGNOSIS — N939 Abnormal uterine and vaginal bleeding, unspecified: Secondary | ICD-10-CM | POA: Insufficient documentation

## 2024-02-10 LAB — CBC WITH DIFFERENTIAL/PLATELET
Abs Immature Granulocytes: 0.01 K/uL (ref 0.00–0.07)
Basophils Absolute: 0 K/uL (ref 0.0–0.1)
Basophils Relative: 1 %
Eosinophils Absolute: 0.1 K/uL (ref 0.0–0.5)
Eosinophils Relative: 3 %
HCT: 32 % — ABNORMAL LOW (ref 36.0–46.0)
Hemoglobin: 9 g/dL — ABNORMAL LOW (ref 12.0–15.0)
Immature Granulocytes: 0 %
Lymphocytes Relative: 35 %
Lymphs Abs: 1.6 K/uL (ref 0.7–4.0)
MCH: 19.1 pg — ABNORMAL LOW (ref 26.0–34.0)
MCHC: 28.1 g/dL — ABNORMAL LOW (ref 30.0–36.0)
MCV: 67.9 fL — ABNORMAL LOW (ref 80.0–100.0)
Monocytes Absolute: 0.5 K/uL (ref 0.1–1.0)
Monocytes Relative: 10 %
Neutro Abs: 2.4 K/uL (ref 1.7–7.7)
Neutrophils Relative %: 51 %
Platelets: 440 K/uL — ABNORMAL HIGH (ref 150–400)
RBC: 4.71 MIL/uL (ref 3.87–5.11)
RDW: 20 % — ABNORMAL HIGH (ref 11.5–15.5)
WBC: 4.7 K/uL (ref 4.0–10.5)
nRBC: 0 % (ref 0.0–0.2)

## 2024-02-10 LAB — BASIC METABOLIC PANEL WITH GFR
Anion gap: 11 (ref 5–15)
BUN: 10 mg/dL (ref 6–20)
CO2: 22 mmol/L (ref 22–32)
Calcium: 9.5 mg/dL (ref 8.9–10.3)
Chloride: 103 mmol/L (ref 98–111)
Creatinine, Ser: 0.9 mg/dL (ref 0.44–1.00)
GFR, Estimated: >60 mL/min (ref >60)
Glucose, Bld: 94 mg/dL (ref 70–99)
Potassium: 4 mmol/L (ref 3.5–5.1)
Sodium: 136 mmol/L (ref 135–145)

## 2024-02-10 MED ORDER — NORETHINDRONE ACETATE 5 MG PO TABS
5.0000 mg | ORAL_TABLET | Freq: Every day | ORAL | Status: DC
  Administered 2024-02-10: 5 mg via ORAL
  Filled 2024-02-10: qty 1

## 2024-02-10 MED ORDER — KETOROLAC TROMETHAMINE 30 MG/ML IJ SOLN
30.0000 mg | Freq: Once | INTRAMUSCULAR | Status: AC
  Administered 2024-02-10: 30 mg via INTRAVENOUS
  Filled 2024-02-10: qty 1

## 2024-02-10 MED ORDER — SENNOSIDES-DOCUSATE SODIUM 8.6-50 MG PO TABS: 1.0000 | ORAL_TABLET | Freq: Every evening | ORAL | 0 refills | Status: AC | PRN

## 2024-02-10 MED ORDER — NORETHINDRONE ACETATE 5 MG PO TABS: 5.0000 mg | ORAL_TABLET | Freq: Every day | ORAL | 0 refills | Status: DC

## 2024-02-10 MED ORDER — OXYCODONE-ACETAMINOPHEN 5-325 MG PO TABS: 1.0000 | ORAL_TABLET | Freq: Four times a day (QID) | ORAL | 0 refills | Status: AC | PRN

## 2024-02-10 MED ORDER — SODIUM CHLORIDE 0.9 % IV BOLUS
500.0000 mL | Freq: Once | INTRAVENOUS | Status: AC
  Administered 2024-02-10: 500 mL via INTRAVENOUS

## 2024-02-10 NOTE — Discharge Instructions (Addendum)
 I have reached darting your oral contraception medication.  Please reach out to your primary care doctor and establish with a GYN.  Return with any new or suddenly worsening symptoms.

## 2024-02-10 NOTE — ED Provider Notes (Signed)
 Emergency Department Provider Note   I have reviewed the triage vital signs and the nursing notes.   HISTORY  Chief Complaint Vaginal Bleeding   HPI Ashley Spencer is a 38 y.o. female with a past of heavy vaginal bleeding, no longer on OCPs presents to the emergency department with breakthrough bleeding for the past 3 weeks.  She has been using multiple pads per day and passing large blood clots including this morning which ultimately prompted her to come to the ED.  She is having cramping lower abdominal pain.  She took her Percocet with no relief.  She has been coordinating with her PCP who has plans to restart her OCP medications but no appointment is available for several weeks. Mild fatigue. No syncope.    Past Medical History:  Diagnosis Date   Anemia    Arthritis    Headache    History of pyelonephritis 2018   History of sepsis    04-04-2019  urosepsis due to obstructive right ureteral stone   History of trichomonal vaginitis    Nocturia    Right ureteral stone     Review of Systems  Constitutional: No fever/chills Cardiovascular: Denies chest pain. Respiratory: Denies shortness of breath. Gastrointestinal: Positive lower abdominal pain.  No nausea, no vomiting.  Positive vaginal bleeding.  Skin: Negative for rash. Neurological: Negative for headaches.   ____________________________________________   PHYSICAL EXAM:  VITAL SIGNS: Vitals:   02/10/24 1120 02/10/24 1133  BP: (!) 147/91   Pulse: 80   Resp: 16   SpO2: 100% 100%    Constitutional: Alert and oriented. Well appearing and in no acute distress. Eyes: Conjunctivae are normal.  Head: Atraumatic. Nose: No congestion/rhinnorhea. Mouth/Throat: Mucous membranes are moist.   Neck: No stridor.   Cardiovascular: Normal rate, regular rhythm. Good peripheral circulation. Grossly normal heart sounds.   Respiratory: Normal respiratory effort.  No retractions. Lungs CTAB. Gastrointestinal: Soft and  nontender. No distention.  Musculoskeletal: No gross deformities of extremities. Neurologic:  Normal speech and language.  Skin:  Skin is warm, dry and intact. No rash noted.   ____________________________________________   LABS (all labs ordered are listed, but only abnormal results are displayed)  Labs Reviewed  CBC WITH DIFFERENTIAL/PLATELET - Abnormal; Notable for the following components:      Result Value   Hemoglobin 9.0 (*)    HCT 32.0 (*)    MCV 67.9 (*)    MCH 19.1 (*)    MCHC 28.1 (*)    RDW 20.0 (*)    Platelets 440 (*)    All other components within normal limits  BASIC METABOLIC PANEL WITH GFR   ____________________________________________  RADIOLOGY  US  PELVIC COMPLETE W TRANSVAGINAL AND TORSION R/O Result Date: 02/10/2024 CLINICAL DATA:  Pelvic pain EXAM: TRANSABDOMINAL AND TRANSVAGINAL ULTRASOUND OF PELVIS DOPPLER ULTRASOUND OF OVARIES TECHNIQUE: Both transabdominal and transvaginal ultrasound examinations of the pelvis were performed. Transabdominal technique was performed for global imaging of the pelvis including uterus, ovaries, adnexal regions, and pelvic cul-de-sac. It was necessary to proceed with endovaginal exam following the transabdominal exam to visualize the bilateral ovaries and uterus. Color and duplex Doppler ultrasound was utilized to evaluate blood flow to the ovaries. COMPARISON:  January 15, 2022 FINDINGS: Uterus Measurements: 9.4 x 5.8 x 6.0 cm = volume: 173 mL. Subserosal fibroid at the fundus of the uterus measuring approximately 3.3 x 2.1 x 3.0 cm. This area previously measured approximately 1.6 x 1.4 x 1.9 cm in 2023. Endometrium Thickness: 11.8 mm.  No focal abnormality visualized. Right ovary Measurements: 3.0 x 1.8 x 1.5 cm = volume: 4 mL. Normal appearance/no adnexal mass. Left ovary Measurements: 3.9 x 2.4 x 3.3 cm = volume: 16 mL. Additionally, there is a complex, hypoechoic left ovarian cyst measuring 2.7 x 1.8 x 2.3 cm. Pulsed Doppler  evaluation of both ovaries demonstrates normal low-resistance arterial and venous waveforms. Other findings None. IMPRESSION: 1. Likely small hemorrhagic left ovarian cyst. 2. Uterine fibroid. Electronically Signed   By: Michaeline Blanch M.D.   On: 02/10/2024 12:43    ____________________________________________   PROCEDURES  Procedure(s) performed:   Procedures  None  ____________________________________________   INITIAL IMPRESSION / ASSESSMENT AND PLAN / ED COURSE  Pertinent labs & imaging results that were available during my care of the patient were reviewed by me and considered in my medical decision making (see chart for details).   This patient is Presenting for Evaluation of vaginal bleeding, which does require a range of treatment options, and is a complaint that involves a high risk of morbidity and mortality.  The Differential Diagnoses includes fibroids, menorrhagia, ectopic pregnancy, etc.  Critical Interventions-    Medications  norethindrone  (AYGESTIN ) tablet 5 mg (5 mg Oral Given 02/10/24 1340)  sodium chloride  0.9 % bolus 500 mL (0 mLs Intravenous Stopped 02/10/24 1340)  ketorolac  (TORADOL ) 30 MG/ML injection 30 mg (30 mg Intravenous Given 02/10/24 1229)    Reassessment after intervention:  Patient remains hemodynamically stable.   Clinical Laboratory Tests Ordered, included CBC shows anemia to 9.0.  No leukocytosis.  No acute kidney injury.  Radiologic Tests Ordered, included US  pelvis. I independently interpreted the images and agree with radiology interpretation.   Cardiac Monitor Tracing which shows NSR.    Social Determinants of Health Risk patient is a non-smoker.   Medical Decision Making: Summary:  Patient presents to the emergency department for evaluation of vaginal bleeding.  Plan for pelvic ultrasound, screening blood work, restart Aygestin .  Reevaluation with update and discussion with patient. Anemia noted but not requiring blood transfusion. TVUS  negative for acute process. Patient is hemodynamically stable.   Patient's presentation is most consistent with acute presentation with potential threat to life or bodily function.   Disposition: discharge  ____________________________________________  FINAL CLINICAL IMPRESSION(S) / ED DIAGNOSES  Final diagnoses:  Abnormal vaginal bleeding    Note:  This document was prepared using Dragon voice recognition software and may include unintentional dictation errors.  Fonda Law, MD, [facility] Emergency Medicine    Kameran Mcneese, Fonda MATSU, MD 02/10/24 1400

## 2024-02-10 NOTE — ED Triage Notes (Signed)
 Pt presents to ED from home C/O heavy vaginal bleeding since beginning of August, started passing large blood clots yesterday. Hx fibroids, but states usually does not have persistent pain/bleeding like this.

## 2024-02-10 NOTE — ED Notes (Signed)
 Pt is back from [place]

## 2024-02-10 NOTE — ED Notes (Signed)
 PT to [place]

## 2024-02-19 ENCOUNTER — Encounter (HOSPITAL_COMMUNITY): Payer: Self-pay | Admitting: Emergency Medicine

## 2024-02-19 ENCOUNTER — Other Ambulatory Visit: Payer: Self-pay

## 2024-02-19 ENCOUNTER — Telehealth: Payer: Self-pay | Admitting: Internal Medicine

## 2024-02-19 ENCOUNTER — Emergency Department (HOSPITAL_COMMUNITY)
Admission: EM | Admit: 2024-02-19 | Discharge: 2024-02-19 | Disposition: A | Discharge status: Home / Self Care | Attending: Emergency Medicine | Admitting: Emergency Medicine

## 2024-02-19 DIAGNOSIS — D649 Anemia, unspecified: Secondary | ICD-10-CM | POA: Insufficient documentation

## 2024-02-19 DIAGNOSIS — Z5329 Procedure and treatment not carried out because of patient's decision for other reasons: Secondary | ICD-10-CM | POA: Diagnosis not present

## 2024-02-19 DIAGNOSIS — N938 Other specified abnormal uterine and vaginal bleeding: Secondary | ICD-10-CM | POA: Diagnosis not present

## 2024-02-19 DIAGNOSIS — R102 Pelvic and perineal pain: Secondary | ICD-10-CM | POA: Diagnosis present

## 2024-02-19 LAB — CBC WITH DIFFERENTIAL/PLATELET
Abs Immature Granulocytes: 0.01 K/uL (ref 0.00–0.07)
Basophils Absolute: 0 K/uL (ref 0.0–0.1)
Basophils Relative: 1 %
Eosinophils Absolute: 0.1 K/uL (ref 0.0–0.5)
Eosinophils Relative: 2 %
HCT: 24.1 % — ABNORMAL LOW (ref 36.0–46.0)
Hemoglobin: 6.6 g/dL — CL (ref 12.0–15.0)
Immature Granulocytes: 0 %
Lymphocytes Relative: 25 %
Lymphs Abs: 1.6 K/uL (ref 0.7–4.0)
MCH: 18.6 pg — ABNORMAL LOW (ref 26.0–34.0)
MCHC: 27.4 g/dL — ABNORMAL LOW (ref 30.0–36.0)
MCV: 68.1 fL — ABNORMAL LOW (ref 80.0–100.0)
Monocytes Absolute: 0.5 K/uL (ref 0.1–1.0)
Monocytes Relative: 8 %
Neutro Abs: 4.1 K/uL (ref 1.7–7.7)
Neutrophils Relative %: 64 %
Platelets: 468 K/uL — ABNORMAL HIGH (ref 150–400)
RBC: 3.54 MIL/uL — ABNORMAL LOW (ref 3.87–5.11)
RDW: 19.5 % — ABNORMAL HIGH (ref 11.5–15.5)
Smear Review: NORMAL
WBC: 6.4 K/uL (ref 4.0–10.5)
nRBC: 0 % (ref 0.0–0.2)

## 2024-02-19 LAB — LIPASE, BLOOD: Lipase: 56 U/L — ABNORMAL HIGH (ref 11–51)

## 2024-02-19 LAB — COMPREHENSIVE METABOLIC PANEL WITH GFR
ALT: 7 U/L (ref 0–44)
AST: 24 U/L (ref 15–41)
Albumin: 3.9 g/dL (ref 3.5–5.0)
Alkaline Phosphatase: 69 U/L (ref 38–126)
Anion gap: 12 (ref 5–15)
BUN: 10 mg/dL (ref 6–20)
CO2: 21 mmol/L — ABNORMAL LOW (ref 22–32)
Calcium: 8.8 mg/dL — ABNORMAL LOW (ref 8.9–10.3)
Chloride: 107 mmol/L (ref 98–111)
Creatinine, Ser: 1.04 mg/dL — ABNORMAL HIGH (ref 0.44–1.00)
GFR, Estimated: >60 mL/min (ref >60)
Glucose, Bld: 95 mg/dL (ref 70–99)
Potassium: 3.6 mmol/L (ref 3.5–5.1)
Sodium: 140 mmol/L (ref 135–145)
Total Bilirubin: <0.2 mg/dL (ref 0.0–1.2)
Total Protein: 7.4 g/dL (ref 6.5–8.1)

## 2024-02-19 LAB — TYPE AND SCREEN
ABO/RH(D): O POS
Antibody Screen: NEGATIVE

## 2024-02-19 LAB — HCG, SERUM, QUALITATIVE: Preg, Serum: NEGATIVE

## 2024-02-19 MED ORDER — TRANEXAMIC ACID 650 MG PO TABS: 1300.0000 mg | ORAL_TABLET | Freq: Three times a day (TID) | ORAL | 0 refills | Status: AC

## 2024-02-19 MED ORDER — OXYCODONE-ACETAMINOPHEN 5-325 MG PO TABS
1.0000 | ORAL_TABLET | Freq: Once | ORAL | Status: AC
  Administered 2024-02-19: 1 via ORAL
  Filled 2024-02-19: qty 1

## 2024-02-19 MED ORDER — SODIUM CHLORIDE 0.9% IV SOLUTION: Freq: Once | INTRAVENOUS | Status: DC

## 2024-02-19 MED ORDER — TRANEXAMIC ACID 650 MG PO TABS
1300.0000 mg | ORAL_TABLET | Freq: Once | ORAL | Status: AC
  Administered 2024-02-19: 1300 mg via ORAL
  Filled 2024-02-19: qty 2

## 2024-02-19 NOTE — Telephone Encounter (Signed)
 Confirmed appt for 9/11

## 2024-02-19 NOTE — Discharge Instructions (Signed)
 Please discontinue your previous birth control pill that was recently prescribed and start taking Lysteda  as prescribed.  Follow-up closely with OB/GYN for further care.  Your blood count is very low today at 6.6.  Do not hesitate to return for hospital admission and blood transfusion

## 2024-02-19 NOTE — ED Triage Notes (Signed)
 Pt reports she was here on 9/1 and diagnosed with fibroids.  GIven medication to slow bleeding and pain medication. She states bleeding has not improved, cramps are worse, and she is out of pain medication.  Has a physician appt tomorrow but would like pain control until then.

## 2024-02-19 NOTE — ED Provider Notes (Signed)
 [place] EMERGENCY DEPARTMENT AT [facility] Provider Note   CSN: [id number] Arrival date & time: 02/19/24  1551     Patient presents with: Pelvic Pain   Ashley Spencer is a 38 y.o. female.   The history is provided by the patient and medical records. No language interpreter was used.  Pelvic Pain     38 year old female with significant history of menorrhagia presenting with complaints of lower abdominal cramping and increased vaginal bleeding.  Patient states for the past month she has had progressive worsening vaginal bleeding now going through about 8 pads per day passing large clots.  Pain is crampy, improved with taking a Percocet that was recently prescribed to her approximately a week ago when she was seen in the ER for same.  She ran out of her pain medication and she does have an appointment with her PCP tomorrow but states she would like some pain management in the meantime.  She mention she was also prescribedI Birth-control pill, Aygestin  a week ago but it hasn't help.  Currently patient denies having any lightheadedness or dizziness no chest pain or shortness of breath no fever or dysuria.  Prior to Admission medications   Medication Sig Start Date End Date Taking? Authorizing Provider  amoxicillin -clavulanate (AUGMENTIN ) 875-125 MG tablet Take 1 tablet by mouth every 12 (twelve) hours. 08/19/23   Enedelia Dorna HERO, FNP  clotrimazole -betamethasone  (LOTRISONE ) cream APPLY TO AFFECTED AREA TWICE A DAY 03/21/22   Vicci Barnie NOVAK, MD  ferrous sulfate  325 (65 FE) MG tablet Take 1 tablet (325 mg total) by mouth daily with breakfast. 03/21/22   Vicci Barnie NOVAK, MD  ibuprofen  (ADVIL ) 800 MG tablet Take 1 tablet (800 mg total) by mouth 3 (three) times daily. 08/19/23   Enedelia Dorna HERO, FNP  methocarbamol  (ROBAXIN ) 500 MG tablet Take 1 tablet (500 mg total) by mouth 2 (two) times daily. 06/23/22   Wedderburn, Ngozi N, NP  naproxen  (NAPROSYN ) 375 MG tablet  Take 1 tablet (375 mg total) by mouth 2 (two) times daily with a meal. 08/17/22   Harris, Abigail, PA-C  norethindrone  (AYGESTIN ) 5 MG tablet Take 1 tablet (5 mg total) by mouth daily. 02/10/24 03/11/24  Long, Fonda MATSU, MD  oxyCODONE  (ROXICODONE ) 5 MG immediate release tablet Take 0.5-1 tablets (2.5-5 mg total) by mouth every 6 (six) hours as needed for severe pain. 08/17/22   Harris, Abigail, PA-C  oxyCODONE -acetaminophen  (PERCOCET/ROXICET) 5-325 MG tablet Take 1 tablet by mouth every 6 (six) hours as needed for severe pain (pain score 7-10). 02/10/24   Long, Joshua G, MD  senna-docusate (SENOKOT-S) 8.6-50 MG tablet Take 1 tablet by mouth at bedtime as needed for mild constipation. 02/10/24   Long, Joshua G, MD    Allergies: Patient has no known allergies.    Review of Systems  Genitourinary:  Positive for pelvic pain.  All other systems reviewed and are negative.   Updated Vital Signs BP (!) 131/92 (BP Location: Left Arm)   Pulse 92   Temp 98.3 F (36.8 C) (Oral)   Resp 18   SpO2 100%   Physical Exam Vitals and nursing note reviewed.  Constitutional:      General: She is not in acute distress.    Appearance: She is well-developed.  HENT:     Head: Atraumatic.  Eyes:     Conjunctiva/sclera: Conjunctivae normal.  Pulmonary:     Effort: Pulmonary effort is normal.  Abdominal:     Tenderness: There is abdominal tenderness (  Mild suprapubic tenderness no guarding no rebound tenderness bowel sounds present).  Musculoskeletal:     Cervical back: Neck supple.  Skin:    Findings: No rash.  Neurological:     Mental Status: She is alert.  Psychiatric:        Mood and Affect: Mood normal.     (all labs ordered are listed, but only abnormal results are displayed) Labs Reviewed  CBC WITH DIFFERENTIAL/PLATELET - Abnormal; Notable for the following components:      Result Value   RBC 3.54 (*)    Hemoglobin 6.6 (*)    HCT 24.1 (*)    MCV 68.1 (*)    MCH 18.6 (*)    MCHC 27.4 (*)    RDW  19.5 (*)    Platelets 468 (*)    All other components within normal limits  COMPREHENSIVE METABOLIC PANEL WITH GFR - Abnormal; Notable for the following components:   CO2 21 (*)    Creatinine, Ser 1.04 (*)    Calcium 8.8 (*)    All other components within normal limits  LIPASE, BLOOD - Abnormal; Notable for the following components:   Lipase 56 (*)    All other components within normal limits  HCG, SERUM, QUALITATIVE  URINALYSIS, ROUTINE W REFLEX MICROSCOPIC  TYPE AND SCREEN    EKG: None  Radiology: No results found.   .Critical Care  Performed by: Nivia Colon, PA-C Authorized by: Nivia Colon, PA-C   Critical care provider statement:    Critical care time (minutes):  30   Critical care was time spent personally by me on the following activities:  Development of treatment plan with patient or surrogate, discussions with consultants, evaluation of patient's response to treatment, examination of patient, ordering and review of laboratory studies, ordering and review of radiographic studies, ordering and performing treatments and interventions, pulse oximetry, re-evaluation of patient's condition and review of old charts    Medications Ordered in the ED  0.9 %  sodium chloride  infusion (Manually program via Guardrails IV Fluids) (0 mLs Intravenous Hold 02/19/24 1752)  tranexamic acid  (LYSTEDA ) tablet 1,300 mg (has no administration in time range)  oxyCODONE -acetaminophen  (PERCOCET/ROXICET) 5-325 MG per tablet 1 tablet (1 tablet Oral Given 02/19/24 1752)                                    Medical Decision Making Amount and/or Complexity of Data Reviewed Labs: ordered.  Risk Prescription drug management.   BP (!) 131/92 (BP Location: Left Arm)   Pulse 92   Temp 98.3 F (36.8 C) (Oral)   Resp 18   SpO2 100%   66:21 PM  38 year old female with significant history of menorrhagia presenting with complaints of lower abdominal cramping and increased vaginal bleeding.   Patient states for the past month she has had progressive worsening vaginal bleeding now going through about 8 pads per day passing large clots.  Pain is crampy, improved with taking a Percocet that was recently prescribed to her approximately a week ago when she was seen in the ER for same.  She ran out of her pain medication and she does have an appointment with her PCP tomorrow but states she would like some pain management in the meantime.  She mention she was also prescribedI Birth-control pill, Aygestin  a week ago but it hasn't help.  Currently patient denies having any lightheadedness or dizziness no chest pain or shortness  of breath no fever or dysuria.  On exam, patient is resting comfortably in no acute discomfort.  She has some mild suprapubic tenderness but no guarding or rebound tenderness.  She is not tachycardic and her lungs are clear.  She is mentating appropriately.  Vital signs overall reassuring no hypotension no tachycardia no fever.  -Labs ordered, independently viewed and interpreted by me.  Labs remarkable for shows a hemoglobin of 6.6 which is a significant drop from a week ago when it was 9. -The patient was maintained on a cardiac monitor.  I personally viewed and interpreted the cardiac monitored which showed an underlying rhythm of: Normal sinus rhythm -Imaging including transvaginal ultrasound considered but that has been done during most recent visit showing evidence of uterine fibroid -This patient presents to the ED for concern of vaginal bleeding, this involves an extensive number of treatment options, and is a complaint that carries with it a high risk of complications and morbidity.  The differential diagnosis includes abnormal uterine bleeding, uterine fibroid, anemia, -Co morbidities that complicate the patient evaluation includes uterine fibroid -Treatment includes percocet.  I offered blood transfusion but pt declined stating she prefers to go home and f/u with her  PCP.  I will consult OBGYN -Reevaluation of the patient after these medicines showed that the patient improved -PCP office notes or outside notes reviewed -Discussion with specialist GYN Dr. Izell who recommend pt should be admitted for her anemia and heavy vaginal bleeding.  However pt opted to leave AMA.  Will give TXA 1,300mg  here as pt does not have IV access.  Will d/c home with TXA 1,300mg  PO TID x 5 days. OBGYN office will attempt to contact pt for outpt f/u -Escalation to admission/observation considered: pt will leave AMA and pt voice understanding the risk of leaving including death.  Pt able to make informed decision.      Final diagnoses:  Symptomatic anemia  DUB (dysfunctional uterine bleeding)    ED Discharge Orders          Ordered    tranexamic acid  (LYSTEDA ) 650 MG TABS tablet  3 times daily        02/19/24 1830               Nivia Colon, PA-C 02/19/24 1831    Pamella Ozell LABOR, DO 02/27/24 [phone]

## 2024-02-19 NOTE — ED Notes (Signed)
 Asked pt for urine specimen she said she is on her monthly

## 2024-02-19 NOTE — ED Provider Triage Note (Cosign Needed)
 Emergency Medicine Provider Triage Evaluation Note  Ashley Spencer , a 38 y.o. female  was evaluated in triage.  Pt complains of abd pain. Endorse progressive worsening lower abd pain/pelvic pain from uterine fibroid.  Also report worsening vaginal bleeding for several weeks.  Does have an appointment with PCP tomorrow but have ran out of pain meds.    Review of Systems  Positive: As above Negative: As above  Physical Exam  BP (!) 131/92 (BP Location: Left Arm)   Pulse 92   Temp 98.3 F (36.8 C) (Oral)   Resp 18   SpO2 100%  Gen:   Awake, no distress   Resp:  Normal effort  MSK:   Moves extremities without difficulty  Other:    Medical Decision Making  Medically screening exam initiated at 4:18 PM.  Appropriate orders placed.  Ashley Spencer was informed that the remainder of the evaluation will be completed by another provider, this initial triage assessment does not replace that evaluation, and the importance of remaining in the ED until their evaluation is complete.     Ashley Colon, PA-C 02/19/24 [phone]

## 2024-02-19 NOTE — ED Notes (Signed)
 Pt was informed her Hgb was low and that she is leaving AMA. Pt says she understands

## 2024-02-20 ENCOUNTER — Ambulatory Visit: Payer: Self-pay | Attending: Internal Medicine | Admitting: Internal Medicine

## 2024-02-20 ENCOUNTER — Encounter: Payer: Self-pay | Admitting: Internal Medicine

## 2024-02-20 ENCOUNTER — Telehealth: Payer: Self-pay | Admitting: Internal Medicine

## 2024-02-20 VITALS — BP 114/72 | HR 92 | Temp 98.5°F | Ht 60.0 in | Wt 111.0 lb

## 2024-02-20 DIAGNOSIS — N939 Abnormal uterine and vaginal bleeding, unspecified: Secondary | ICD-10-CM

## 2024-02-20 DIAGNOSIS — F17200 Nicotine dependence, unspecified, uncomplicated: Secondary | ICD-10-CM

## 2024-02-20 DIAGNOSIS — Z139 Encounter for screening, unspecified: Secondary | ICD-10-CM

## 2024-02-20 DIAGNOSIS — D500 Iron deficiency anemia secondary to blood loss (chronic): Secondary | ICD-10-CM | POA: Diagnosis not present

## 2024-02-20 DIAGNOSIS — D259 Leiomyoma of uterus, unspecified: Secondary | ICD-10-CM

## 2024-02-20 MED ORDER — TRAMADOL HCL 50 MG PO TABS: 50.0000 mg | ORAL_TABLET | Freq: Three times a day (TID) | ORAL | 0 refills | Status: AC

## 2024-02-20 MED ORDER — IBUPROFEN 800 MG PO TABS: 800.0000 mg | ORAL_TABLET | Freq: Three times a day (TID) | ORAL | 1 refills | Status: AC | PRN

## 2024-02-20 NOTE — Progress Notes (Signed)
 Patient ID: Ashley Spencer, female    DOB: 11-23-1985  MRN: [id number]  CC: Vaginal Bleeding (Vaginal bleeding, blood loss, abdominal cramping - diagnosed with fibroid tumor in uterus/Pt refused blood transfusion in ER)   Subjective: Ashley Spencer is a 38 y.o. female who presents for menorrhagia with anemia due to chronic blood loss  Her concerns today include:  Pt with hx of Tobacco dependence, ureteral stone, uterine leiomyoma, menorrhagia w/ regular cycle, dysmenorrhea, anemia due to chronic blood loss.  She presented to the ER 02/19/2024 and 02/10/2024 for complaints of lower abdominal cramping and increased vaginal bleeding.   Seen in ER on 02/10/2024: her Hg was 9.0. Pt was given NaCl bolus and a TVUS was ordered and showed, small hemorrhagic left ovarian cyst and a Uterine fibroid. Patient was hemodynamically stable and discharged on Norethindone Acetate. Was also given 5 days of Percocet.  Seen in ER again on 02/19/2024: She returned with worsening of bleeding and contractions.   ER course 02/19/2024:  Vitals in ED: BP: 131/92, P 92 - HCG neg On CBC Hg was 6.6, Hct 24.1. Plts 468. - They d/c Norethindone due to worsened bleeding - Was given 1 more percocet tablet  - OBGYN referral. They recommended Pt be admitted for blood transfusion but she declined.  - She wanted to leave AMA so they started her on PO TXA 1,300mg  and d/c her home with TXA 1,300mg  PO TID x 5 days.   Today the patient reports constant vaginal bleeding since August 1st. No bleeding through 6 pads a day. She notes that a prior depot shot worsened her bleeding, and her periods lasted about 7 days with heavy flow in the past. She has not seen an OBGYN in 15 years, is done having children, and is not on birth control. She describes constant cramping abdominal pain that feels like contractions. She began taking OTC iron 65 mg yesterday. She denies lightheadedness, dizziness, chest pain, shortness of breath. Her blood  pressure today is 114/72, lower than her baseline of 130-150s/90s. Says she does not like the idea of blood transfusions but is willing to get it in an emergency.  Her pain is still intense-- ibuprofen  and tylenol  don't help, only the percocet helps with pain.   Of note: pt was seen with similar presentation in this office in 2023 and was referred to gynecology at least 3 times and each time she was called by them she declined the appt. She called Dr. Elvera office today and is waiting to be scheduled.  Tobacco dependence: stopped smoking, reports she cannot afford cigarettes   Patient Active Problem List   Diagnosis Date Noted   Ureteral stone 04/04/2019     Current Outpatient Medications on File Prior to Visit  Medication Sig Dispense Refill   clotrimazole -betamethasone  (LOTRISONE ) cream APPLY TO AFFECTED AREA TWICE A DAY 30 g 0   ferrous sulfate  325 (65 FE) MG tablet Take 1 tablet (325 mg total) by mouth daily with breakfast. 60 tablet 0   ibuprofen  (ADVIL ) 800 MG tablet Take 1 tablet (800 mg total) by mouth 3 (three) times daily. 21 tablet 0   methocarbamol  (ROBAXIN ) 500 MG tablet Take 1 tablet (500 mg total) by mouth 2 (two) times daily. 20 tablet 0   naproxen  (NAPROSYN ) 375 MG tablet Take 1 tablet (375 mg total) by mouth 2 (two) times daily with a meal. 20 tablet 0   oxyCODONE  (ROXICODONE ) 5 MG immediate release tablet Take 0.5-1 tablets (2.5-5 mg total)  by mouth every 6 (six) hours as needed for severe pain. 12 tablet 0   oxyCODONE -acetaminophen  (PERCOCET/ROXICET) 5-325 MG tablet Take 1 tablet by mouth every 6 (six) hours as needed for severe pain (pain score 7-10). 12 tablet 0   senna-docusate (SENOKOT-S) 8.6-50 MG tablet Take 1 tablet by mouth at bedtime as needed for mild constipation. 20 tablet 0   tranexamic acid  (LYSTEDA ) 650 MG TABS tablet Take 2 tablets (1,300 mg total) by mouth 3 (three) times daily for 5 days. 30 tablet 0   amoxicillin -clavulanate (AUGMENTIN ) 875-125 MG  tablet Take 1 tablet by mouth every 12 (twelve) hours. (Patient not taking: Reported on 02/20/2024) 14 tablet 0   No current facility-administered medications on file prior to visit.    No Known Allergies  Social History   Socioeconomic History   Marital status: Single    Spouse name: Not on file   Number of children: 3   Years of education: 11th   Highest education level: Not on file  Occupational History    Employer: OTHER    Comment: n/a  Tobacco Use   Smoking status: Former    Current packs/day: 0.00    Average packs/day: 1 pack/day for 3.0 years (3.0 ttl pk-yrs)    Types: Cigarettes    Start date: 04/02/2016    Quit date: 04/03/2019    Years since quitting: 4.8   Smokeless tobacco: Never  Vaping Use   Vaping status: Never Used  Substance and Sexual Activity   Alcohol use: No   Drug use: No   Sexual activity: Yes    Birth control/protection: None  Other Topics Concern   Not on file  Social History Narrative   Patient lives at home with her grandmother.   Caffeine Use: 1 cup every other morning   Social Drivers of Health   Financial Resource Strain: Medium Risk (02/20/2024)   Overall Financial Resource Strain (CARDIA)    Difficulty of Paying Living Expenses: Somewhat hard  Food Insecurity: Food Insecurity Present (02/20/2024)   Hunger Vital Sign    Worried About Running Out of Food in the Last Year: Sometimes true    Ran Out of Food in the Last Year: Sometimes true  Transportation Needs: No Transportation Needs (02/20/2024)   PRAPARE - Administrator, Civil Service (Medical): No    Lack of Transportation (Non-Medical): No  Physical Activity: Inactive (02/20/2024)   Exercise Vital Sign    Days of Exercise per Week: 0 days    Minutes of Exercise per Session: 0 min  Stress: Stress Concern Present (02/20/2024)   [employer] of Occupational Health - Occupational Stress Questionnaire    Feeling of Stress: To some extent  Social Connections:  Moderately Isolated (02/20/2024)   Social Connection and Isolation Panel    Frequency of Communication with Friends and Family: Once a week    Frequency of Social Gatherings with Friends and Family: Once a week    Attends Religious Services: More than 4 times per year    Active Member of [employer] or Organizations: No    Attends Banker Meetings: 1 to 4 times per year    Marital Status: Never married  Intimate Partner Violence: Not At Risk (02/20/2024)   Humiliation, Afraid, Rape, and Kick questionnaire    Fear of Current or Ex-Partner: No    Emotionally Abused: No    Physically Abused: No    Sexually Abused: No    Family History  Problem Relation Age of  Onset   Migraines Father 28   Hypertension Brother    Hypertension Maternal Aunt    Hypertension Maternal Uncle    Hypertension Maternal Grandmother     Past Surgical History:  Procedure Laterality Date   CYSTOSCOPY W/ URETERAL STENT PLACEMENT Right 04/04/2019   Procedure: CYSTOSCOPY WITH RETROGRADE PYELOGRAM/URETERAL STENT PLACEMENT;  Surgeon: Alvaro Hummer, MD;  Location: [facility];  Service: Urology;  Laterality: Right;   CYSTOSCOPY WITH RETROGRADE PYELOGRAM, URETEROSCOPY AND STENT PLACEMENT Right 04/22/2019   Procedure: CYSTOSCOPY WITH RETROGRADE PYELOGRAM, DIAGNOSTIC URETEROSCOPY AND STENT EXCHANGE;  Surgeon: Alvaro Hummer, MD;  Location: [facility];  Service: Urology;  Laterality: Right;  60 MINS    ROS: Review of Systems Negative except as stated above  PHYSICAL EXAM: BP 114/72 (BP Location: Left Arm, Patient Position: Sitting, Cuff Size: Normal)   Pulse 92   Temp 98.5 F (36.9 C) (Oral)   Ht 5' (1.524 m)   Wt 50.3 kg   SpO2 100%   BMI 21.68 kg/m   Physical Exam  General appearance - alert, young AAF, in no acute distress Mental status - behavior is agitated, speech volume elevated, thought processes ruminating on pain Eyes: Conjunctiva pale bilaterally  Chest - clear to auscultation,  no wheezes, rales or rhonchi, symmetric air entry Heart - normal rate, regular rhythm, normal S1, S2, no murmurs, rubs, clicks or gallops Skin - normal coloration and turgor, no rashes, no suspicious skin lesions noted       Latest Ref Rng & Units 02/19/2024    4:28 PM 02/10/2024   11:43 AM 01/15/2022    4:54 PM  CMP  Glucose 70 - 99 mg/dL 95  94  95   BUN 6 - 20 mg/dL 10  10  13    Creatinine 0.44 - 1.00 mg/dL 8.95  9.09  9.00   Sodium 135 - 145 mmol/L 140  136  138   Potassium 3.5 - 5.1 mmol/L 3.6  4.0  3.7   Chloride 98 - 111 mmol/L 107  103  108   CO2 22 - 32 mmol/L 21  22  24    Calcium 8.9 - 10.3 mg/dL 8.8  9.5  9.2   Total Protein 6.5 - 8.1 g/dL 7.4     Total Bilirubin 0.0 - 1.2 mg/dL <9.7     Alkaline Phos 38 - 126 U/L 69     AST 15 - 41 U/L 24     ALT 0 - 44 U/L 7      Lipid Panel  No results found for: CHOL, TRIG, HDL, CHOLHDL, VLDL, LDLCALC, LDLDIRECT  CBC    Component Value Date/Time   WBC 6.4 02/19/2024 1628   RBC 3.54 (L) 02/19/2024 1628   HGB 6.6 (LL) 02/19/2024 1628   HGB 11.9 12/03/2019 1650   HCT 24.1 (L) 02/19/2024 1628   HCT 36.3 12/03/2019 1650   PLT 468 (H) 02/19/2024 1628   PLT 345 12/03/2019 1650   MCV 68.1 (L) 02/19/2024 1628   MCV 84 12/03/2019 1650   MCH 18.6 (L) 02/19/2024 1628   MCHC 27.4 (L) 02/19/2024 1628   RDW 19.5 (H) 02/19/2024 1628   RDW 13.1 12/03/2019 1650   LYMPHSABS 1.6 02/19/2024 1628   MONOABS 0.5 02/19/2024 1628   EOSABS 0.1 02/19/2024 1628   BASOSABS 0.0 02/19/2024 1628    ASSESSMENT AND PLAN:  Assessment and Plan   1. Abnormal uterine bleeding (AUB) (Primary) Patient with history of uterine fibroid presents with ongoing heavy vaginal bleeding  likely related to uterine fibroid. Hemodynamically stable today, but continues to report heavy bleeding and pain. Would like her to be seen by OBGYN for further evaluation, and told her to call back Dr. Izell. Will resend OBGYN referral as well. Gave her a limited  course of tramadol  for pain along with ibuprofen  Q8H PRN. Discussed tramadol  can cause drowsiness and not to take it right before operating machinery. - Continue 5 day course of PO TXA given by ED.   - Ambulatory referral resent to Obstetrics / Gynecology - ibuprofen  (ADVIL ) 800 MG tablet; Take 1 tablet (800 mg total) by mouth every 8 (eight) hours as needed.  Dispense: 60 tablet; Refill: 1 - traMADol  (ULTRAM ) 50 MG tablet; Take 1 tablet (50 mg total) by mouth every 8 (eight) hours for 5 days.  Dispense: 15 tablet; Refill: 0  2. Anemia due to chronic blood loss Currently asymptomatic, likely related to her 1 month of AUB. Will increase her PO OTC iron intake to BID to take once in AM and once in PM.  - CBC in clinic - Ambulatory referral to Hematology / Oncology to be considered for iron infusion since she has declined blood transfusion - Ambulatory referral to Obstetrics / Gynecology - Iron, TIBC and Ferritin Panel - Take PO OTC iron 65 mg twice a day, once AM, once PM  3. Uterine leiomyoma, unspecified location #1 and #2 as above.  4. Encounter for screening involving social determinants of health (SDoH) Provided information for food pantry in [place]. - AMB Referral VBCI Care Management  Patient was given the opportunity to ask questions.  Patient verbalized understanding of the plan and was able to repeat key elements of the plan.    Orders Placed This Encounter  Procedures   CBC   Iron, TIBC and Ferritin Panel   AMB Referral VBCI Care Management   Ambulatory referral to Hematology / Oncology   Ambulatory referral to Obstetrics / Gynecology     Requested Prescriptions   Signed Prescriptions Disp Refills   ibuprofen  (ADVIL ) 800 MG tablet 60 tablet 1    Sig: Take 1 tablet (800 mg total) by mouth every 8 (eight) hours as needed.   traMADol  (ULTRAM ) 50 MG tablet 15 tablet 0    Sig: Take 1 tablet (50 mg total) by mouth every 8 (eight) hours for 5 days.    Return if  symptoms worsen or fail to improve.  This is a Psychologist, occupational Note.  The care of the patient was discussed with Dr. Vicci and the assessment and plan formulated with her assistance.  Please see her attestation of this encounter.  Duwaine Amber, MS3 [employer]  Evaluation and management procedures were performed by me with Medical Student in attendance, note written by Medical Student under my supervision and collaboration. I have reviewed the note and I agree with the management and plan. NNCSRS reviewed.  Barnie Vicci, MD, FAAFP. [facility] [place], [place] [phone]   02/20/2024, 5:37 PM

## 2024-02-20 NOTE — Telephone Encounter (Signed)
 Error

## 2024-02-20 NOTE — Patient Instructions (Addendum)
 Take two 65 mg of iron twice every day for anemia. Take one in the morning and one in the evening.  I will give you 10 tablets of tramadol  50 mg to take every 8 hours. It can cause drowsiness-- Don't take it 1 hour before driving or operating machinery.   I will give you ibuprofen  800 mg to take every 8 hours for cramping. Maximum dose of 3200 mg per day.  Try to call Dr. Elvera office again. I have resent a referral to him.

## 2024-02-21 LAB — CBC
Hematocrit: 25.8 % — ABNORMAL LOW (ref 34.0–46.6)
Hemoglobin: 7.1 g/dL — ABNORMAL LOW (ref 11.1–15.9)
MCH: 19.4 pg — ABNORMAL LOW (ref 26.6–33.0)
MCHC: 27.5 g/dL — ABNORMAL LOW (ref 31.5–35.7)
MCV: 71 fL — ABNORMAL LOW (ref 79–97)
Platelets: 526 x10E3/uL — ABNORMAL HIGH (ref 150–450)
RBC: 3.66 x10E6/uL — ABNORMAL LOW (ref 3.77–5.28)
RDW: 18.3 % — ABNORMAL HIGH (ref 11.7–15.4)
WBC: 7.8 x10E3/uL (ref 3.4–10.8)

## 2024-02-21 LAB — IRON,TIBC AND FERRITIN PANEL
Ferritin: 130 ng/mL (ref 15–150)
Iron Saturation: 82 % (ref 15–55)
Iron: 326 ug/dL (ref 27–159)
Total Iron Binding Capacity: 400 ug/dL (ref 250–450)
UIBC: 74 ug/dL — ABNORMAL LOW (ref 131–425)

## 2024-02-22 ENCOUNTER — Telehealth: Payer: Self-pay | Admitting: Internal Medicine

## 2024-02-22 NOTE — Telephone Encounter (Signed)
 Attempted to reach patient today via phone to go over lab results.  I got an automated voicemail for her cell phone and for the other phone number listed.  I did not leave a message.  Will try her again.

## 2024-02-23 ENCOUNTER — Telehealth: Payer: Self-pay | Admitting: Internal Medicine

## 2024-02-23 DIAGNOSIS — D500 Iron deficiency anemia secondary to blood loss (chronic): Secondary | ICD-10-CM

## 2024-02-23 DIAGNOSIS — R790 Abnormal level of blood mineral: Secondary | ICD-10-CM

## 2024-02-23 NOTE — Telephone Encounter (Signed)
 PC placed to pt today to discuss lab results. Pt informed that she is still anemic but a little improved. I am concern about her high iron and iron saturation levels. Told to hold iron supplement and return to the lab in 1 week for recheck. Pt reports she is still bleeding but it has decreased a lot since last visit. Has appt with GYN later this mth. Await appt with hematology.

## 2024-02-28 ENCOUNTER — Telehealth: Payer: Self-pay | Admitting: *Deleted

## 2024-02-28 NOTE — Progress Notes (Signed)
 Complex Care Management Note Care Guide Note  02/28/2024 Name: Ashley Spencer MRN: [id number] DOB: 06-Dec-1985   Complex Care Management Outreach Attempts: An unsuccessful telephone outreach was attempted today to offer the patient information about available complex care management services.  Follow Up Plan:  Additional outreach attempts will be made to offer the patient complex care management information and services.   Encounter Outcome:  No Answer  Harlene Satterfield  [facility]  [facility], [facility] Guide  Direct Dial: [phone]  Fax [phone]

## 2024-03-03 ENCOUNTER — Encounter: Admitting: Internal Medicine

## 2024-03-03 ENCOUNTER — Other Ambulatory Visit

## 2024-03-03 NOTE — Progress Notes (Unsigned)
 Complex Care Management Note Care Guide Note  03/03/2024 Name: Ashley Spencer MRN: [id number] DOB: 08-05-1985   Complex Care Management Outreach Attempts: A second unsuccessful outreach was attempted today to offer the patient with information about available complex care management services.  Follow Up Plan:  Additional outreach attempts will be made to offer the patient complex care management information and services.   Encounter Outcome:  No Answer  Harlene Satterfield  [facility]  [facility], [facility] Guide  Direct Dial: [phone]  Fax [phone]

## 2024-03-04 ENCOUNTER — Ambulatory Visit: Admitting: Obstetrics and Gynecology

## 2024-03-04 NOTE — Progress Notes (Signed)
 Complex Care Management Note Care Guide Note  03/04/2024 Name: Ashley Spencer MRN: [id number] DOB: 05/19/1986   Complex Care Management Outreach Attempts: A third unsuccessful outreach was attempted today to offer the patient with information about available complex care management services.  Follow Up Plan:  No further outreach attempts will be made at this time. We have been unable to contact the patient to offer or enroll patient in complex care management services.  Encounter Outcome:  No Answer  Harlene Satterfield  [facility]  [facility], [facility] Guide  Direct Dial: [phone]  Fax [phone]

## 2024-03-08 ENCOUNTER — Telehealth: Payer: Self-pay | Admitting: Internal Medicine

## 2024-03-08 NOTE — Telephone Encounter (Signed)
 Pt was referred to GYN and hematology for severe vaginal bleeding due to fibroid. Looks like her appt with GYN was canceled. I also received message from hematology that they have not been able to reach her to schedule. Please have her call [phone] and ask to speak with the new patient coordinator to schedule.  Why did she cancel GYN appt?Ashley Spencer

## 2024-03-09 NOTE — Telephone Encounter (Signed)
 Called but no answer. LVM to call back.

## 2024-03-10 NOTE — Telephone Encounter (Signed)
 Called & spoke to the patient. Verified name & DOB. Informed patient that Dr.Johnson noticed that appointment with Gyn was cancelled and inquired as to cancellation reason. Patient stated that she did not cancel the appointment but their office did. She is unable to recall the rason to cancellation but was told that she would be called to be r/s and has not received a call back. Advised patient to call them back and r/s Patient confirmed that she has their office phone number.  Informed patient that hematology has been trying to reach her to schedule an appointment but has been unsuccessful. Patient stated that she has their number as well and will call to make appointment at both places once she is out of work. No further assistance needed at this time.

## 2024-06-29 ENCOUNTER — Other Ambulatory Visit: Payer: Self-pay | Admitting: Internal Medicine

## 2024-06-29 DIAGNOSIS — N939 Abnormal uterine and vaginal bleeding, unspecified: Secondary | ICD-10-CM

## 2024-06-29 NOTE — Telephone Encounter (Unsigned)
 Copied from CRM [id number]. Topic: Clinical - Medication Refill >> Jun 29, 2024 12:19 PM Avram MATSU wrote: Medication: ibuprofen  (ADVIL ) 800 MG tablet [[id number]] naproxen  (NAPROSYN ) 375 MG tablet [[id number]]  Has the patient contacted their pharmacy? Yes (Agent: If no, request that the patient contact the pharmacy for the refill. If patient does not wish to contact the pharmacy document the reason why and proceed with request.) (Agent: If yes, when and what did the pharmacy advise?)  This is the patient's preferred pharmacy:  [facility] [phone] [address], [place] - [address] [place] [address] Phone: [phone] Fax: [phone]   Is this the correct pharmacy for this prescription? yes If no, delete pharmacy and type the correct one.   Has the prescription been filled recently? No  Is the patient out of the medication? No  Has the patient been seen for an appointment in the last year OR does the patient have an upcoming appointment? no  Can we respond through MyChart? Yes/ call   Agent: Please be advised that Rx refills may take up to 3 business days. We ask that you follow-up with your pharmacy.

## 2024-06-30 MED ORDER — IBUPROFEN 800 MG PO TABS: 800.0000 mg | ORAL_TABLET | Freq: Three times a day (TID) | ORAL | 0 refills | Status: AC

## 2024-06-30 NOTE — Telephone Encounter (Signed)
 Requested medications are due for refill today.  unsure  Requested medications are on the active medications list.  yes  Last refill. IBU 02/20/2024 #60 1 rf, Naproxen  08/17/2022 #20 0 rf  Future visit scheduled.   yes  Notes to clinic.  Rx's signed by other providers. Please review for refill.    Requested Prescriptions  Pending Prescriptions Disp Refills   ibuprofen  (ADVIL ) 800 MG tablet 21 tablet 0    Sig: Take 1 tablet (800 mg total) by mouth 3 (three) times daily.     Analgesics:  NSAIDS Failed - 06/30/2024 11:40 AM      Failed - Manual Review: Labs are only required if the patient has taken medication for more than 8 weeks.      Failed - Cr in normal range and within 360 days    Creatinine, Ser  Date Value Ref Range Status  02/19/2024 1.04 (H) 0.44 - 1.00 mg/dL Final         Failed - HGB in normal range and within 360 days    Hemoglobin  Date Value Ref Range Status  02/20/2024 7.1 (L) 11.1 - 15.9 g/dL Final         Failed - PLT in normal range and within 360 days    Platelets  Date Value Ref Range Status  02/20/2024 526 (H) 150 - 450 x10E3/uL Final         Failed - HCT in normal range and within 360 days    Hematocrit  Date Value Ref Range Status  02/20/2024 25.8 (L) 34.0 - 46.6 % Final         Passed - eGFR is 30 or above and within 360 days    GFR calc Af Amer  Date Value Ref Range Status  04/06/2019 >60 >60 mL/min Final   GFR, Estimated  Date Value Ref Range Status  02/19/2024 >60 >60 mL/min Final    Comment:    (NOTE) Calculated using the CKD-EPI Creatinine Equation (2021)          Passed - Patient is not pregnant      Passed - Valid encounter within last 12 months    Recent Outpatient Visits           4 months ago Abnormal uterine bleeding (AUB)   [facility] [place]. [facility] Vicci Barnie NOVAK, MD   2 years ago Menorrhagia with regular cycle   [facility] - A Dept Of [place].  [facility] Vicci Barnie NOVAK, MD   3 years ago Tinea pedis of both feet   [facility] Of [place]. [facility] Vicci Barnie NOVAK, MD   4 years ago No-show for appointment   [facility] - [facility] Dept Of [place]. [facility] Lorren, Virginia J, NP   4 years ago Dysfunctional uterine bleeding   [facility] - [facility] Of [place]. [facility] Vicci Barnie NOVAK, MD               naproxen  (NAPROSYN ) 375 MG tablet 20 tablet 0    Sig: Take 1 tablet (375 mg total) by mouth 2 (two) times daily with a meal.     Analgesics:  NSAIDS Failed - 06/30/2024 11:40 AM      Failed - Manual Review: Labs are only required if the patient has taken medication  for more than 8 weeks.      Failed - Cr in normal range and within 360 days    Creatinine, Ser  Date Value Ref Range Status  02/19/2024 1.04 (H) 0.44 - 1.00 mg/dL Final         Failed - HGB in normal range and within 360 days    Hemoglobin  Date Value Ref Range Status  02/20/2024 7.1 (L) 11.1 - 15.9 g/dL Final         Failed - PLT in normal range and within 360 days    Platelets  Date Value Ref Range Status  02/20/2024 526 (H) 150 - 450 x10E3/uL Final         Failed - HCT in normal range and within 360 days    Hematocrit  Date Value Ref Range Status  02/20/2024 25.8 (L) 34.0 - 46.6 % Final         Passed - eGFR is 30 or above and within 360 days    GFR calc Af Amer  Date Value Ref Range Status  04/06/2019 >60 >60 mL/min Final   GFR, Estimated  Date Value Ref Range Status  02/19/2024 >60 >60 mL/min Final    Comment:    (NOTE) Calculated using the CKD-EPI Creatinine Equation (2021)          Passed - Patient is not pregnant      Passed - Valid encounter within last 12 months    Recent Outpatient Visits           4 months ago Abnormal uterine bleeding (AUB)   [facility] [place]. [facility] Vicci Barnie NOVAK, MD   2 years ago Menorrhagia with regular cycle   [facility] - [facility] Dept Of [place]. [facility] Vicci Barnie NOVAK, MD   3 years ago Tinea pedis of both feet   [facility] [place] [facility] - [facility] Dept Of [place]. [facility] Vicci Barnie NOVAK, MD   4 years ago No-show for appointment   [facility] - A Dept Of [place]. [facility] Lorren, Virginia J, NP   4 years ago Dysfunctional uterine bleeding   [facility] - A Dept Of [place]. [facility] Vic[facility] Barnie NOVAK, MD

## 2024-07-13 ENCOUNTER — Telehealth: Payer: Self-pay | Admitting: Internal Medicine

## 2024-07-13 ENCOUNTER — Ambulatory Visit: Payer: Self-pay | Admitting: Internal Medicine

## 2024-07-13 NOTE — Telephone Encounter (Signed)
 Contacted pt left vm to resch appt office closed due to the weather. If the pt calls back please resch appt
